# Patient Record
Sex: Female | Born: 1990 | Race: White | Hispanic: No | Marital: Married | Smoking: Never smoker
Health system: Southern US, Community
[De-identification: ages and names within clinical notes are randomized; demographics above are authoritative.]

## PROBLEM LIST (undated history)

## (undated) DIAGNOSIS — A0472 Enterocolitis due to Clostridium difficile, not specified as recurrent: Secondary | ICD-10-CM

## (undated) DIAGNOSIS — Z87898 Personal history of other specified conditions: Secondary | ICD-10-CM

## (undated) DIAGNOSIS — K219 Gastro-esophageal reflux disease without esophagitis: Secondary | ICD-10-CM

## (undated) DIAGNOSIS — D649 Anemia, unspecified: Secondary | ICD-10-CM

## (undated) DIAGNOSIS — Q046 Congenital cerebral cysts: Secondary | ICD-10-CM

## (undated) DIAGNOSIS — F419 Anxiety disorder, unspecified: Secondary | ICD-10-CM

## (undated) HISTORY — DX: Anxiety disorder, unspecified: F41.9

## (undated) HISTORY — DX: Gastro-esophageal reflux disease without esophagitis: K21.9

## (undated) HISTORY — DX: Enterocolitis due to Clostridium difficile, not specified as recurrent: A04.72

## (undated) HISTORY — DX: Congenital cerebral cysts: Q04.6

## (undated) HISTORY — DX: Personal history of other specified conditions: Z87.898

## (undated) HISTORY — PX: EYE SURGERY: SHX253

## (undated) HISTORY — DX: Anemia, unspecified: D64.9

---

## 2016-06-26 DIAGNOSIS — D509 Iron deficiency anemia, unspecified: Secondary | ICD-10-CM | POA: Insufficient documentation

## 2017-01-15 DIAGNOSIS — R09A2 Foreign body sensation, throat: Secondary | ICD-10-CM | POA: Insufficient documentation

## 2017-01-15 DIAGNOSIS — R0989 Other specified symptoms and signs involving the circulatory and respiratory systems: Secondary | ICD-10-CM | POA: Insufficient documentation

## 2017-12-30 HISTORY — PX: UPPER GASTROINTESTINAL ENDOSCOPY: SHX188

## 2018-05-18 DIAGNOSIS — R002 Palpitations: Secondary | ICD-10-CM | POA: Insufficient documentation

## 2018-05-22 DIAGNOSIS — F419 Anxiety disorder, unspecified: Secondary | ICD-10-CM | POA: Insufficient documentation

## 2018-07-27 LAB — HM HEPATITIS C SCREENING LAB: HM Hepatitis Screen: NEGATIVE

## 2018-12-15 DIAGNOSIS — R9089 Other abnormal findings on diagnostic imaging of central nervous system: Secondary | ICD-10-CM | POA: Insufficient documentation

## 2018-12-15 DIAGNOSIS — G43009 Migraine without aura, not intractable, without status migrainosus: Secondary | ICD-10-CM | POA: Insufficient documentation

## 2020-11-01 DIAGNOSIS — H33321 Round hole, right eye: Secondary | ICD-10-CM | POA: Diagnosis not present

## 2020-11-01 DIAGNOSIS — H35413 Lattice degeneration of retina, bilateral: Secondary | ICD-10-CM | POA: Diagnosis not present

## 2020-11-01 DIAGNOSIS — H16423 Pannus (corneal), bilateral: Secondary | ICD-10-CM | POA: Diagnosis not present

## 2021-01-18 DIAGNOSIS — Z833 Family history of diabetes mellitus: Secondary | ICD-10-CM | POA: Diagnosis not present

## 2021-01-18 DIAGNOSIS — Z8379 Family history of other diseases of the digestive system: Secondary | ICD-10-CM | POA: Diagnosis not present

## 2021-01-18 DIAGNOSIS — Z823 Family history of stroke: Secondary | ICD-10-CM | POA: Diagnosis not present

## 2021-01-18 DIAGNOSIS — Z23 Encounter for immunization: Secondary | ICD-10-CM | POA: Diagnosis not present

## 2021-01-18 DIAGNOSIS — Z8249 Family history of ischemic heart disease and other diseases of the circulatory system: Secondary | ICD-10-CM | POA: Diagnosis not present

## 2021-01-18 DIAGNOSIS — Z8489 Family history of other specified conditions: Secondary | ICD-10-CM | POA: Diagnosis not present

## 2021-01-18 DIAGNOSIS — Z8269 Family history of other diseases of the musculoskeletal system and connective tissue: Secondary | ICD-10-CM | POA: Diagnosis not present

## 2021-01-18 DIAGNOSIS — Z8042 Family history of malignant neoplasm of prostate: Secondary | ICD-10-CM | POA: Diagnosis not present

## 2021-01-18 DIAGNOSIS — D649 Anemia, unspecified: Secondary | ICD-10-CM | POA: Diagnosis not present

## 2021-01-18 DIAGNOSIS — Z0000 Encounter for general adult medical examination without abnormal findings: Secondary | ICD-10-CM | POA: Diagnosis not present

## 2021-01-18 DIAGNOSIS — Z1329 Encounter for screening for other suspected endocrine disorder: Secondary | ICD-10-CM | POA: Diagnosis not present

## 2021-01-18 DIAGNOSIS — Z13220 Encounter for screening for lipoid disorders: Secondary | ICD-10-CM | POA: Diagnosis not present

## 2021-01-18 DIAGNOSIS — Z13228 Encounter for screening for other metabolic disorders: Secondary | ICD-10-CM | POA: Diagnosis not present

## 2021-02-03 DIAGNOSIS — Z203 Contact with and (suspected) exposure to rabies: Secondary | ICD-10-CM | POA: Diagnosis not present

## 2021-02-03 DIAGNOSIS — Z2914 Encounter for prophylactic rabies immune globin: Secondary | ICD-10-CM | POA: Diagnosis not present

## 2021-02-03 DIAGNOSIS — Z23 Encounter for immunization: Secondary | ICD-10-CM | POA: Diagnosis not present

## 2021-02-06 DIAGNOSIS — Z203 Contact with and (suspected) exposure to rabies: Secondary | ICD-10-CM | POA: Diagnosis not present

## 2021-02-10 DIAGNOSIS — Z203 Contact with and (suspected) exposure to rabies: Secondary | ICD-10-CM | POA: Diagnosis not present

## 2021-02-17 DIAGNOSIS — Z203 Contact with and (suspected) exposure to rabies: Secondary | ICD-10-CM | POA: Diagnosis not present

## 2021-06-22 DIAGNOSIS — G8929 Other chronic pain: Secondary | ICD-10-CM | POA: Diagnosis not present

## 2021-06-22 DIAGNOSIS — R0989 Other specified symptoms and signs involving the circulatory and respiratory systems: Secondary | ICD-10-CM | POA: Diagnosis not present

## 2021-06-22 DIAGNOSIS — M542 Cervicalgia: Secondary | ICD-10-CM | POA: Diagnosis not present

## 2021-06-22 DIAGNOSIS — M25572 Pain in left ankle and joints of left foot: Secondary | ICD-10-CM | POA: Diagnosis not present

## 2021-06-22 DIAGNOSIS — M25552 Pain in left hip: Secondary | ICD-10-CM | POA: Diagnosis not present

## 2021-06-22 DIAGNOSIS — R110 Nausea: Secondary | ICD-10-CM | POA: Diagnosis not present

## 2021-06-22 DIAGNOSIS — M545 Low back pain, unspecified: Secondary | ICD-10-CM | POA: Diagnosis not present

## 2021-06-22 DIAGNOSIS — M2559 Pain in other specified joint: Secondary | ICD-10-CM | POA: Diagnosis not present

## 2021-07-04 DIAGNOSIS — R0981 Nasal congestion: Secondary | ICD-10-CM | POA: Diagnosis not present

## 2021-07-04 DIAGNOSIS — R0989 Other specified symptoms and signs involving the circulatory and respiratory systems: Secondary | ICD-10-CM | POA: Diagnosis not present

## 2021-07-24 DIAGNOSIS — L309 Dermatitis, unspecified: Secondary | ICD-10-CM | POA: Diagnosis not present

## 2021-08-14 DIAGNOSIS — R519 Headache, unspecified: Secondary | ICD-10-CM | POA: Diagnosis not present

## 2021-08-28 DIAGNOSIS — B9689 Other specified bacterial agents as the cause of diseases classified elsewhere: Secondary | ICD-10-CM | POA: Diagnosis not present

## 2021-08-28 DIAGNOSIS — N760 Acute vaginitis: Secondary | ICD-10-CM | POA: Diagnosis not present

## 2021-08-29 DIAGNOSIS — G44099 Other trigeminal autonomic cephalgias (TAC), not intractable: Secondary | ICD-10-CM | POA: Diagnosis not present

## 2021-08-29 DIAGNOSIS — Q046 Congenital cerebral cysts: Secondary | ICD-10-CM | POA: Insufficient documentation

## 2021-08-29 DIAGNOSIS — G44039 Episodic paroxysmal hemicrania, not intractable: Secondary | ICD-10-CM | POA: Diagnosis not present

## 2021-08-29 DIAGNOSIS — R9089 Other abnormal findings on diagnostic imaging of central nervous system: Secondary | ICD-10-CM | POA: Diagnosis not present

## 2021-08-29 DIAGNOSIS — M4135 Thoracogenic scoliosis, thoracolumbar region: Secondary | ICD-10-CM | POA: Insufficient documentation

## 2021-08-29 DIAGNOSIS — G44209 Tension-type headache, unspecified, not intractable: Secondary | ICD-10-CM | POA: Diagnosis not present

## 2021-09-13 DIAGNOSIS — K148 Other diseases of tongue: Secondary | ICD-10-CM | POA: Diagnosis not present

## 2021-10-08 ENCOUNTER — Other Ambulatory Visit: Payer: Self-pay

## 2021-10-08 ENCOUNTER — Inpatient Hospital Stay (HOSPITAL_COMMUNITY)
Admission: AD | Admit: 2021-10-08 | Discharge: 2021-10-08 | Disposition: A | Discharge status: Home / Self Care | Payer: BC Managed Care – PPO | Attending: Family Medicine | Admitting: Family Medicine

## 2021-10-08 DIAGNOSIS — Z3202 Encounter for pregnancy test, result negative: Secondary | ICD-10-CM

## 2021-10-08 DIAGNOSIS — R1030 Lower abdominal pain, unspecified: Secondary | ICD-10-CM | POA: Insufficient documentation

## 2021-10-08 DIAGNOSIS — N939 Abnormal uterine and vaginal bleeding, unspecified: Secondary | ICD-10-CM | POA: Insufficient documentation

## 2021-10-08 DIAGNOSIS — N930 Postcoital and contact bleeding: Secondary | ICD-10-CM

## 2021-10-08 LAB — POCT PREGNANCY, URINE: Preg Test, Ur: NEGATIVE

## 2021-10-08 NOTE — MAU Provider Note (Signed)
Event Date/Time   First Provider Initiated Contact with Patient 10/08/21 1919      S Donna [name] is a 30 y.o. G0P0 non-pregnant patient who presents to MAU today with chief complaints of worsening postcoital spotting, abnormal uterine bleeding and lower abdominal pain. These are recurrent problems, onset early summer 2022.  Patient's periods are typically manageable and consistent. She endorses new recurrent light spotting between periods. She is not saturating pads but is concerned that she now has some level of bleeding after ever episode of intercourse.  Patient's abdominal pain is mild but worsening over time. Onset shortly after onset of spotting. Pain is suprapubic, does not radiate but is occasionally sharp enough to force her to stop all activity.  Patient states she was evaluated for these complaints at symptom onset when her symptoms were comparatively mild. She states the only abnormality discovered was Bacterial Vaginosis, for which she completed treatment. She has since requested follow-up evaluation in office but has then had to reschedule those appointments due to either  the onset of her menstrual cycle or change in provider schedule. She is tearful throughout ROS and verbalizes frustration that her symptoms are worsening without a diagnosis.   O BP 110/66 (BP Location: Right Arm)   Pulse 75   Temp 97.9 F (36.6 C) (Oral)   Resp 19   Ht 5\' 3"  (1.6 m)   Wt 57.7 kg   SpO2 100%   BMI 22.51 kg/m    Physical Exam Vitals and nursing note reviewed. Exam conducted with a chaperone present.  Constitutional:      Appearance: She is well-developed.  Cardiovascular:     Rate and Rhythm: Normal rate.  Pulmonary:     Effort: Pulmonary effort is normal.  Abdominal:     Palpations: Abdomen is soft.     Tenderness: There is no abdominal tenderness.  Skin:    Capillary Refill: Capillary refill takes less than 2 seconds.  Neurological:     Mental Status: She is alert and  oriented to person, place, and time.  Psychiatric:        Mood and Affect: Mood normal.        Behavior: Behavior normal.     Comments: Tearful throughout provider assessment    A Medical screening exam complete Negative urine pregnancy test in MAU Abnormal Uterine Bleeding Postcoital spotting Abdominal pain   P Discharge from MAU in stable condition Patient declines transfer to [facility] for [facility]YN evaluation  F/U: Outpatient GYN Pelvic ultrasound with TVUS ordered, will follow up on results when available Per patient request, given contact information for [facility] to schedule New GYN appointment  [facility], MSA, MSN, CNM Certified Nurse Midwife, [facility] for [facility]

## 2021-10-08 NOTE — MAU Note (Addendum)
Presents with c/o abdominal pain and VB.  Reports VB is less than menstrual bleeding, but having in between cycles. Denies current abdominal pain.  Pt states she isn't pregnant, has a Gyn office ([place]), went to Urgent Care today.  States will have to wait for appt @ Gyn office.

## 2021-10-22 ENCOUNTER — Other Ambulatory Visit (HOSPITAL_BASED_OUTPATIENT_CLINIC_OR_DEPARTMENT_OTHER): Payer: BC Managed Care – PPO

## 2021-11-01 NOTE — Progress Notes (Signed)
GYNECOLOGY OFFICE VISIT NOTE  History:  Ms. Donna [name] is a 30 y.o. G0P0 non-pregnant patient who presents to MAU today with chief complaints:  Ongoing postcoital spotting This started this summer in August and has been after every time they are sexually active. They are sexually active 1-2 times a month.  Lower abdominal pain and Dyspareunia Patient's abdominal pain is mild but worsening over time. Onset shortly after onset of spotting. Pain is suprapubic, does not radiate but is occasionally sharp enough to force her to stop all activity.  She reports otherwise regular cycles and denies intermenstrual spotting.  She changes a pad when she uses pads every 4-6 hours (4-5 times a day).  She sometimes uses the menstrual cup. She had a TSH in January which was normal. Her Hgb was 10.6 and ferritin noted to be low. She was started on PO iron. She had an EGD.    Past Medical History:  Diagnosis Date   Acid reflux    Anemia    History of palpitations     Past Surgical History:  Procedure Laterality Date   UPPER GASTROINTESTINAL ENDOSCOPY  2019    The following portions of the patient's history were reviewed and updated as appropriate: allergies, current medications, past family history, past medical history, past social history, past surgical history and problem list.   Health Maintenance:    No results found for: DIAGPAP    Review of Systems:  Pertinent items noted in HPI and remainder of comprehensive ROS otherwise negative.  Physical Exam:  BP 120/76   Pulse 86   Wt 127 lb (57.6 kg)   LMP 10/22/2021   BMI 22.50 kg/m  CONSTITUTIONAL: Well-developed, well-nourished female in no acute distress.  HEENT:  Normocephalic, atraumatic. External right and left ear normal. No scleral icterus.  NECK: Normal range of motion, supple, no masses noted on observation SKIN: No rash noted. Not diaphoretic. No erythema. No pallor. MUSCULOSKELETAL: Normal range of motion. No edema  noted. NEUROLOGIC: Alert and oriented to person, place, and time. Normal muscle tone coordination. No cranial nerve deficit noted. PSYCHIATRIC: Normal mood and affect. Normal behavior. Normal judgment and thought content.  CARDIOVASCULAR: Normal heart rate noted RESPIRATORY: Effort and breath sounds normal, no problems with respiration noted ABDOMEN: No masses noted. No other overt distention noted.    PELVIC: Normal appearing external genitalia - negative qtip test; normal urethral meatus; normal appearing vaginal mucosa and cervix.  Levator tenderness bilaterally. No abnormal discharge noted.  Normal uterine size, no other palpable masses, present uterine or adnexal tenderness. Performed in the presence of a chaperone  Labs and Imaging No results found for this or any previous visit (from the past 168 hour(s)). No results found.  Care every where reviewed - BV in August.   Assessment and Plan:    1. Postcoital bleeding - GC/CT checked today; Recent trich negative - Recheck BV although unlikely etiology - No pap on file so this was done today with HPV testing - Given persistence, if testing above is negative, would also do colposcopy and given tenderness, we also discussed doing potentially an EMB. She has an [place] on 11/7 which I instructed her to keep - We also discussed empiric doxy for possible infection of the uterus I.e. chronic endometritis, but she would rather do the EMB/Cx before doing empiric therapy.   2. Intermenstrual spotting - She denies this today although noted in MAU note. Will be evaluated with [place].    3. Abdominal pain, unspecified  abdominal location - Unclear etiology at this time from exam alone - could be entirely from levator spasm/myofascial or possible endometritis. Will do PFPT referral and will make f/u appt to complete evaluation pending pap and Cx.   4. Desires pregnancy - Plans to TTC in December/January  Routine preventative health maintenance measures  emphasized. Please refer to After Visit Summary for other counseling recommendations.   No follow-ups on file.  I spent  48  minutes dedicated to the care of this patient including pre-visit review of records, face to face time with the patient discussing her conditions and treatments and post visit orders.    Milas Hock, MD, FACOG Obstetrician & Gynecologist, [facility] for [facility], [facility]

## 2021-11-02 ENCOUNTER — Encounter: Payer: Self-pay | Admitting: Obstetrics and Gynecology

## 2021-11-02 ENCOUNTER — Other Ambulatory Visit: Payer: Self-pay

## 2021-11-02 ENCOUNTER — Ambulatory Visit: Payer: BC Managed Care – PPO | Admitting: Obstetrics and Gynecology

## 2021-11-02 ENCOUNTER — Other Ambulatory Visit (HOSPITAL_COMMUNITY)
Admission: RE | Admit: 2021-11-02 | Discharge: 2021-11-02 | Disposition: A | Discharge status: Home / Self Care | Payer: BC Managed Care – PPO | Source: Ambulatory Visit | Attending: Obstetrics and Gynecology | Admitting: Obstetrics and Gynecology

## 2021-11-02 VITALS — BP 120/76 | HR 86 | Wt 127.0 lb

## 2021-11-02 DIAGNOSIS — N930 Postcoital and contact bleeding: Secondary | ICD-10-CM | POA: Insufficient documentation

## 2021-11-02 DIAGNOSIS — N923 Ovulation bleeding: Secondary | ICD-10-CM | POA: Diagnosis not present

## 2021-11-02 DIAGNOSIS — R109 Unspecified abdominal pain: Secondary | ICD-10-CM | POA: Insufficient documentation

## 2021-11-02 DIAGNOSIS — Z319 Encounter for procreative management, unspecified: Secondary | ICD-10-CM | POA: Diagnosis not present

## 2021-11-02 NOTE — Progress Notes (Signed)
Patient reports last pap 08/28/2021 with [facility]

## 2021-11-05 ENCOUNTER — Other Ambulatory Visit: Payer: Self-pay

## 2021-11-05 ENCOUNTER — Ambulatory Visit (HOSPITAL_BASED_OUTPATIENT_CLINIC_OR_DEPARTMENT_OTHER)
Admission: RE | Admit: 2021-11-05 | Discharge: 2021-11-05 | Disposition: A | Discharge status: Home / Self Care | Payer: BC Managed Care – PPO | Source: Ambulatory Visit | Attending: Advanced Practice Midwife | Admitting: Advanced Practice Midwife

## 2021-11-05 DIAGNOSIS — R188 Other ascites: Secondary | ICD-10-CM | POA: Diagnosis not present

## 2021-11-05 DIAGNOSIS — N926 Irregular menstruation, unspecified: Secondary | ICD-10-CM | POA: Diagnosis not present

## 2021-11-05 DIAGNOSIS — N930 Postcoital and contact bleeding: Secondary | ICD-10-CM | POA: Diagnosis not present

## 2021-11-05 LAB — CERVICOVAGINAL ANCILLARY ONLY
Bacterial Vaginitis (gardnerella): NEGATIVE
Candida Glabrata: NEGATIVE
Candida Vaginitis: NEGATIVE
Comment: NEGATIVE
Comment: NEGATIVE
Comment: NEGATIVE

## 2021-11-07 LAB — CYTOLOGY - PAP
Chlamydia: NEGATIVE
Comment: NEGATIVE
Comment: NEGATIVE
Comment: NORMAL
Diagnosis: NEGATIVE
Diagnosis: REACTIVE
High risk HPV: NEGATIVE
Neisseria Gonorrhea: NEGATIVE

## 2021-12-05 ENCOUNTER — Encounter: Payer: BC Managed Care – PPO | Admitting: Obstetrics and Gynecology

## 2021-12-06 ENCOUNTER — Other Ambulatory Visit: Payer: Self-pay

## 2021-12-06 ENCOUNTER — Encounter: Payer: Self-pay | Admitting: Physical Therapy

## 2021-12-06 ENCOUNTER — Ambulatory Visit: Payer: BC Managed Care – PPO | Attending: Obstetrics and Gynecology | Admitting: Physical Therapy

## 2021-12-06 DIAGNOSIS — R279 Unspecified lack of coordination: Secondary | ICD-10-CM | POA: Insufficient documentation

## 2021-12-06 DIAGNOSIS — R269 Unspecified abnormalities of gait and mobility: Secondary | ICD-10-CM | POA: Insufficient documentation

## 2021-12-06 DIAGNOSIS — R109 Unspecified abdominal pain: Secondary | ICD-10-CM | POA: Diagnosis not present

## 2021-12-06 DIAGNOSIS — M6281 Muscle weakness (generalized): Secondary | ICD-10-CM | POA: Diagnosis not present

## 2021-12-06 DIAGNOSIS — R293 Abnormal posture: Secondary | ICD-10-CM | POA: Diagnosis not present

## 2021-12-06 DIAGNOSIS — R252 Cramp and spasm: Secondary | ICD-10-CM | POA: Insufficient documentation

## 2021-12-06 NOTE — Patient Instructions (Signed)
Bladder Irritants  Certain foods and beverages can be irritating to the bladder.  Avoiding these irritants may decrease your symptoms of urinary urgency, frequency or bladder pain.  Even reducing your intake can help with your symptoms.  Not everyone is sensitive to all bladder irritants, so you may consider focusing on one irritant at a time, removing or reducing your intake of that irritant for 7-10 days to see if this change helps your symptoms.  Water intake is also very important.  Below is a list of bladder irritants.  Drinks: alcohol, carbonated beverages, caffeinated beverages such as coffee and tea, drinks with artificial sweeteners, citrus juices, apple juice, tomato juice  Foods: tomatoes and tomato based foods, spicy food, sugar and artificial sweeteners, vinegar, chocolate, raw onion, apples, citrus fruits, pineapple, cranberries, tomatoes, strawberries, plums, peaches, cantaloupe  Other: acidic urine (too concentrated) - see water intake info below  Substitutes you can try that are NOT irritating to the bladder: cooked onion, pears, papayas, sun-brewed decaf teas, watermelons, non-citrus herbal teas, apricots, kava and low-acid instant drinks (Postum).    WATER INTAKE: Remember to drink lots of water (aim for fluid intake of half your body weight with 2/3 of fluids being water).  You may be limiting fluids due to fear of leakage, but this can actually worsen urgency symptoms due to highly concentrated urine.  Water helps balance the pH of your urine so it doesn't become too acidic - acidic urine is a bladder irritant!    Lubrication Used for intercourse to reduce friction Avoid ones that have glycerin, warming gels, tingling gels, icing or cooling gel, scented Avoid parabens due to a preservative similar to female sex hormone May need to be reapplied once or several times during sexual activity Can be applied to both partners genitals prior to vaginal penetration to minimize  friction or irritation Prevent irritation and mucosal tears that cause post coital pain and increased the risk of vaginal and urinary tract infections Oil-based lubricants cannot be used with condoms due to breaking them down.  Least likely to irritate vaginal tissue.  Plant based-lubes are safe Silicone-based lubrication are thicker and last long and used for post-menopausal women  Vaginal Lubricators Here is a list of some suggested lubricators you can use for intercourse. Use the most hypoallergenic product.  You can place on you or your partner.  Slippery Stuff ( water based) Sylk or Sliquid Natural H2O ( good  if frequent UTI's)- walmart, amazon Sliquid organics silk-(aloe and silicone based ) [employer] (www.blossom-organics.com)- (aloe based ) Coconut oil, olive oil -not good with condoms  PJur Woman Nude- (water based) amazon Uberlube- ( silicon) Amazon Aloe Vera- Sprouts has an organic one Yes lubricant- (water based and has plant oil based similar to silicone) [employer] Platinum-Silicone, Target, [facility] Olive and Bee intimate cream-  www.oliveandbee.com.au Pink - [employer] stuff Erosense Sync- walmart, amazon Coconu- EverydayCosmetics.no  Things to avoid in lubricants are glycerin, warming gels, tingling gels, icing or cooling  gels, and scented gels.  Also avoid Vaseline. KY jelly, Replens, and Astroglide contain chlorhexidine which kills good bacteria(lactobacilli)  Things to avoid in the vaginal area Do not use things to irritate the vulvar area No lotions- see below Soaps you  can use :Aveeno, Calendula, Good Clean Love cleanser if needed. Must be gentle No deodorants No douches Good to sleep without underwear to let the vaginal area to air out No scrubbing: spread the lips to let warm water rinse over labias and pat  dry  Creams that can be used on the Vulva Area V [employer], walmart Vital V Wild Yam Salve Julva- [employer] Botanical Pro-Meno Wild Yam  Cream Coconut oil, olive oil Cleo by [employer] labial moisturizer -Amazon,  Desert Warren Releveum ( lidocaine) or Desert [employer] Yes [employer]

## 2021-12-06 NOTE — Therapy (Signed)
[facility] @ [facility] [address] [place], [place], [address] Phone: [phone]   Fax:  [phone]  Physical Therapy Evaluation  Patient Details  Name: Donna [name] MRN: [id number] Date of Birth: November 05, 1991 Referring Provider (PT): Milas Hock, MD   Encounter Date: 12/06/2021   PT End of Session - 12/06/21 1529     Visit Number 1    Date for PT Re-Evaluation 04/06/22    PT Start Time 1436    PT Stop Time 1524    PT Time Calculation (min) 48 min    Activity Tolerance Patient tolerated treatment well;Patient limited by pain    Behavior During Therapy [facility] for tasks assessed/performed             Past Medical History:  Diagnosis Date   Acid reflux    Anemia    History of palpitations     Past Surgical History:  Procedure Laterality Date   UPPER GASTROINTESTINAL ENDOSCOPY  2019    There were no vitals filed for this visit.    Subjective Assessment - 12/06/21 1438     Subjective Pt reports she has been having pressure/pain increasing recent past few months, pain with intercourse deep penetration, does have dryness and irritation. Pt has pain globally chronically and constantly mostly on the Lt side and midly on Rt side. Pt reports pain with recent gyno internal assessment. Pt reports spotting after intercourse and did have break through bleeding last week, does have regular cycles.    Patient Stated Goals Pelvic sonogram is otherwise unremarkable.    Currently in Pain? Yes    Pain Score 1    6/10 global pain currently   Pain Location Pelvis    Pain Orientation Left    Pain Descriptors / Indicators Throbbing    Pain Type Chronic pain    Pain Frequency Constant                [facility] PT Assessment - 12/06/21 0001       Assessment   Medical Diagnosis R10.9 (ICD-10-CM) - Abdominal pain, unspecified abdominal location    Referring Provider (PT) Milas Hock, MD    Prior Therapy no      Precautions    Precautions None      Restrictions   Weight Bearing Restrictions No      Balance Screen   Has the patient fallen in the past 6 months No    Has the patient had a decrease in activity level because of a fear of falling?  No    Is the patient reluctant to leave their home because of a fear of falling?  No      Home [employer]ist information centre manager residence    Living Arrangements Spouse/significant other      Prior Function   Level of Independence Independent    Vocation Full time employment    Therapist, occupational service    Leisure read, nap, TV      Cognition   Overall Cognitive Status Within Functional Limits for tasks assessed      Sensation   Light Touch Impaired Detail    Light Touch Impaired Details --   Lt shoulder pain that feels cold into mid upper arm for short periods of time. Pt reports sometimes she will notices numbness in fingers and toes but not regularly.     Coordination   Gross Motor Movements are Fluid and Coordinated Yes    Fine Motor Movements  are Fluid and Coordinated Yes      Posture/Postural Control   Posture/Postural Control Postural limitations    Postural Limitations Rounded Shoulders;Posterior pelvic tilt      ROM / Strength   AROM / PROM / Strength AROM;Strength      AROM   Overall AROM Comments thoracic and lumbar spine limited by 50% in side bending, flexion and 25% in rotation and extension      Strength   Overall Strength Comments Rt hip 4/5 grossly; Lt hip 3-/5 abduction, 3/5 all others      Flexibility   Soft Tissue Assessment /Muscle Length yes   decreased by 25% in bil adduction and hamstrings and IR/ER     Palpation   Palpation comment TTP throughout abdomen all quadrants and fascial restrictions noted throughout, poor mobility of abdomen. Also decreased rib mobility with expansion posteriorly and laterally.              No emotional/communication barriers or cognitive limitation. Patient is motivated to  learn. Patient understands and agrees with treatment goals and plan. PT explains patient will be examined in standing, sitting, and lying down to see how their muscles and joints work. When they are ready, they will be asked to remove their underwear so PT can examine their perineum. The patient is also given the option of providing their own chaperone as one is not provided in our facility. The patient also has the right and is explained the right to defer or refuse any part of the evaluation or treatment including the internal exam. With the patient's consent, PT will use one gloved finger to gently assess the muscles of the pelvic floor, seeing how well it contracts and relaxes and if there is muscle symmetry. After, the patient will get dressed and PT and patient will discuss exam findings and plan of care. PT and patient discuss plan of care, schedule, attendance policy and HEP activities.           Objective measurements completed on examination: See above findings.     Pelvic Floor Special Questions - 12/06/21 0001     Prior Pelvic/Prostate Exam Yes   normal   Are you Pregnant or attempting pregnancy? No   not pregnant > potentially trying to get pregnant   Prior Pregnancies No    Currently Sexually Active Yes    Is this Painful Yes    History of sexually transmitted disease No    Marinoff Scale pain prevents any attempts at intercourse    Urinary Leakage No    Urinary urgency Yes   and will go to restroom, and not a lot of urine will come out with mild burning initially.   Urinary frequency sometimes feels like she needs to go quicker than every 2-3 hours    Fecal incontinence No   does report constipation struggles constantly   Fluid intake not a lot of water    Caffeine beverages yes- cutting back and now drinking more decaf    Falling out feeling (prolapse) No    External Perineal Exam WFL    Prolapse None    Pelvic Floor Internal Exam patient identified and patient confirms  consent for PT to perform internal soft tissue work and muscle strength and integrity assessment    Exam Type Vaginal    Sensation hypersensitive    Palpation TTP externally at UG diaphragm, Lt adductor proximially, and suprapubic pain    Strength --   0/5 however high tone throughout pelvis  so possibly due to tone pt unable to relax enough to demonstrate contraction.   Strength # of reps 0    Strength # of seconds 0    Tone increased                         PT Short Term Goals - 12/06/21 1713       PT SHORT TERM GOAL #1   Title pt to be I with HEP    Time 5    Period Weeks    Status New    Target Date 01/10/22      PT SHORT TERM GOAL #2   Title pt to report no more than 5/10 with mobility at pelvis to improve tolerance to exercising and community mobility    Time 5    Period Weeks    Status New    Target Date 01/10/22      PT SHORT TERM GOAL #3   Title pt to demonstrate ability to coordinate pelvic floor and breathing mechanics at least 50% of the time with activity to improve pelvic floor function.    Time 5    Period Weeks    Status New    Target Date 01/10/22      PT SHORT TERM GOAL #4   Title pt to report improved bowel regularity without need of straining to at least 3 BMs per week.    Time 5    Period Weeks    Status New    Target Date 01/10/22      PT SHORT TERM GOAL #5   Title pt to demonstrate at least 3/5 pelvic floor strength and ability to isometrically hold contractions 5s for improved pelvic floor function    Time 5    Period Weeks    Status New    Target Date 01/10/22               PT Long Term Goals - 12/06/21 1715       PT LONG TERM GOAL #1   Title pt to be I withadvanced  HEP    Time 4    Status New    Target Date 04/06/22      PT LONG TERM GOAL #2   Title pt to report no more than 2/10 with mobility at pelvis to improve tolerance to exercising and community mobility    Time 4    Period Months    Status New     Target Date 04/06/22      PT LONG TERM GOAL #3   Title pt to demonstrate ability to coordinate pelvic floor and breathing mechanics at least 75% of the time with activity to improve pelvic floor function.    Time 4    Period Months    Status New    Target Date 04/06/22      PT LONG TERM GOAL #4   Title pt to demonstrate at least 4/5 pelvic floor strength and ability to isometrically hold contractions 8s for improved pelvic floor function    Time 4    Status New    Target Date 04/06/22      PT LONG TERM GOAL #5   Title pt to demonstrate ability tos squat 20# from ground level with proper mechanics with no more than 2/10 pain for improved functional mobility    Time 4    Period Months    Status New    Target Date 04/06/22  Plan - 12/06/21 1529     Clinical Impression Statement Pt is 30 year old female presenting to clinic reporting global pain more so at Lt side compared to Rt but on both sides, had a recent gynocologist appointment and told she had a weak pelvic floor and had pain everywhere during this per pt. Pt also reports she has had struggled with constipation chronically, pain with deep penetration with intercourse, does have dryness and irritation vaginally, and has had spotting post intercourse as well. Pt reports recently she has been cutting back on caffiene and trying to drink more water but knows she doesn't drink enough, and very motivated to improve self physically and get healthier to try to have less pain. Pt also reports she feels she has a lot stress with work, home renovations, and pain which makes her feel worse. Pt found to have decreased mobility in spine and bil hips, decreased strength in hips, and fascial restrictions noted in abdomen, pelvis and hips and glutes. Pt also had TTP at abdomen, pelvic region, adductors, quads. Pt consented to internal pelvic floor assessment and found to have high tone throughout, decreased coordination and  inability to contract or relax pelvic floor at all. Pt also had decreased rib mobility and breathing mechanics. Pt would benefit from addtional PT to further address deficits found during eval.    Personal Factors and Comorbidities Time since onset of injury/illness/exacerbation    Examination-Activity Limitations Lift;Squat;Locomotion Level    Examination-Participation Restrictions Interpersonal Relationship;Community Activity;Shop;Occupation    Stability/Clinical Decision Making Evolving/Moderate complexity    Clinical Decision Making Moderate    Rehab Potential Good    PT Frequency 1x / week    PT Duration Other (comment)   10 weeks   PT Treatment/Interventions ADLs/Self Care Home Management;Aquatic Therapy;Functional mobility training;Therapeutic activities;Therapeutic exercise;Neuromuscular re-education;Manual techniques;Patient/family education;Taping;Scar mobilization;Passive range of motion;Energy conservation    PT Next Visit Plan stretchin at hips, breathing mechanics, manual at hips and abdomen    PT Home Exercise Plan [email or web address]    Consulted and Agree with Plan of Care Patient             Patient will benefit from skilled therapeutic intervention in order to improve the following deficits and impairments:  Decreased coordination, Decreased endurance, Pain, Impaired flexibility, Improper body mechanics, Postural dysfunction, Decreased strength, Decreased mobility, Impaired tone, Increased fascial restricitons  Visit Diagnosis: Muscle weakness (generalized) - Plan: PT plan of care cert/re-cert  Lack of coordination - Plan: PT plan of care cert/re-cert  Cramp and spasm - Plan: PT plan of care cert/re-cert  Abnormality of gait and mobility - Plan: PT plan of care cert/re-cert  Abnormal posture - Plan: PT plan of care cert/re-cert     Problem List Patient Active Problem List   Diagnosis Date Noted   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 [place] [facility] [place], [place], [address] Phone: [phone]   Fax:  [phone]  Name: Donna [name] MRN: [id number] Date of Birth: [birth year]

## 2022-01-09 ENCOUNTER — Other Ambulatory Visit: Payer: Self-pay

## 2022-01-09 ENCOUNTER — Ambulatory Visit: Payer: BC Managed Care – PPO | Attending: Obstetrics and Gynecology | Admitting: Physical Therapy

## 2022-01-09 DIAGNOSIS — R279 Unspecified lack of coordination: Secondary | ICD-10-CM | POA: Insufficient documentation

## 2022-01-09 DIAGNOSIS — R252 Cramp and spasm: Secondary | ICD-10-CM | POA: Diagnosis not present

## 2022-01-09 NOTE — Therapy (Signed)
[facility] [address] [place], [place], [address] Phone: [phone]   Fax:  [phone]  Physical Therapy Treatment  Patient Details  Name: Donna [name] MRN: [id number] Date of Birth: Sep 13, 1991 Referring Provider (PT): Milas Hockuncan, Paula, MD   Encounter Date: 01/09/2022   PT End of Session - 01/09/22 1254     Visit Number 2    Date for PT Re-Evaluation 04/06/22    Authorization Type BCBS    PT Start Time 1145    PT Stop Time 1226    PT Time Calculation (min) 41 min    Activity Tolerance Patient tolerated treatment well;Patient limited by pain    Behavior During Therapy [facility] for tasks assessed/performed             Past Medical History:  Diagnosis Date   Acid reflux    Anemia    History of palpitations     Past Surgical History:  Procedure Laterality Date   UPPER GASTROINTESTINAL ENDOSCOPY  2019    There were no vitals filed for this visit.   Subjective Assessment - 01/09/22 1147     Subjective Pt reports she has had intermittent pain ups and downs since eval. Pt has had a little pelvic pain with initially starting HEP but has not had any bleeding or pain with intercourse.                            Pelvic Floor Special Questions - 01/09/22 0001     Pelvic Floor Internal Exam patient identified and patient confirms consent for PT to perform internal soft tissue work and muscle strength and integrity assessment    Exam Type Vaginal    Tone see manual for details               [facility] Adult PT Treatment/Exercise - 01/09/22 0001       Self-Care   Self-Care Other Self-Care Comments    Other Self-Care Comments  pt educated on HEP, relaxation techniques      Exercises   Exercises Lumbar;Knee/Hip      Lumbar Exercises: Seated   Other Seated Lumbar Exercises pelvic tilts on pool noodle x10 ant/post, Rt/Lt and static sitting with diaphragmatic breathing x10    Other Seated Lumbar  Exercises pelvic tilts sitting on ball x10 Rt/Lt, ant/post, figure 8s      Manual Therapy   Manual Therapy Soft tissue mobilization;Myofascial release    Manual therapy comments manual work at [employer] triangle for decreased tone with great tightness noted externally over leggings with progressively increasing pressure of release techniques with pt reporting improvement in mobility and decreased pain. PT also completed manual work at bil ischial tubs with increased tension in surrounding medial tissue as well.                     PT Education - 01/09/22 1254     Education Details Pt educated on continued pelvic relaxation techniques, HEP    Person(s) Educated Patient    Methods Explanation;Demonstration;Tactile cues;Verbal cues    Comprehension Returned demonstration;Verbalized understanding              PT Short Term Goals - 12/06/21 1713       PT SHORT TERM GOAL #1   Title pt to be I with HEP    Time 5    Period Weeks    Status New    Target  Date 01/10/22      PT SHORT TERM GOAL #2   Title pt to report no more than 5/10 with mobility at pelvis to improve tolerance to exercising and community mobility    Time 5    Period Weeks    Status New    Target Date 01/10/22      PT SHORT TERM GOAL #3   Title pt to demonstrate ability to coordinate pelvic floor and breathing mechanics at least 50% of the time with activity to improve pelvic floor function.    Time 5    Period Weeks    Status New    Target Date 01/10/22      PT SHORT TERM GOAL #4   Title pt to report improved bowel regularity without need of straining to at least 3 BMs per week.    Time 5    Period Weeks    Status New    Target Date 01/10/22      PT SHORT TERM GOAL #5   Title pt to demonstrate at least 3/5 pelvic floor strength and ability to isometrically hold contractions 5s for improved pelvic floor function    Time 5    Period Weeks    Status New    Target Date 01/10/22               PT  Long Term Goals - 12/06/21 1715       PT LONG TERM GOAL #1   Title pt to be I withadvanced  HEP    Time 4    Status New    Target Date 04/06/22      PT LONG TERM GOAL #2   Title pt to report no more than 2/10 with mobility at pelvis to improve tolerance to exercising and community mobility    Time 4    Period Months    Status New    Target Date 04/06/22      PT LONG TERM GOAL #3   Title pt to demonstrate ability to coordinate pelvic floor and breathing mechanics at least 75% of the time with activity to improve pelvic floor function.    Time 4    Period Months    Status New    Target Date 04/06/22      PT LONG TERM GOAL #4   Title pt to demonstrate at least 4/5 pelvic floor strength and ability to isometrically hold contractions 8s for improved pelvic floor function    Time 4    Status New    Target Date 04/06/22      PT LONG TERM GOAL #5   Title pt to demonstrate ability tos squat 20# from ground level with proper mechanics with no more than 2/10 pain for improved functional mobility    Time 4    Period Months    Status New    Target Date 04/06/22                   Plan - 01/09/22 1257     Clinical Impression Statement Pt presents to clinic reporting intermittent pelvic pain but no pain with vaginal penetration or break through bleeding or postcoital spotting since eval. Pt session focused on manual work at [employer] triangle for improved pelvic relaxation and education on HEP and pelvic relaxation techniques for home. Pt reported improvement at end of session with not feeling as tight but did have a little discomfort at Rt ishcial tube but "not bad". Pt denied additional questions  at end of session, continues to demonstrate need of PT for decreased pain, improved tissue mobility and improved QOL.    Personal Factors and Comorbidities Time since onset of injury/illness/exacerbation    Examination-Activity Limitations Lift;Squat;Locomotion Level     Examination-Participation Restrictions Interpersonal Relationship;Community Activity;Shop;Occupation    Stability/Clinical Decision Making Evolving/Moderate complexity    Rehab Potential Good    PT Frequency 1x / week    PT Duration Other (comment)   10 weeks   PT Treatment/Interventions ADLs/Self Care Home Management;Aquatic Therapy;Functional mobility training;Therapeutic activities;Therapeutic exercise;Neuromuscular re-education;Manual techniques;Patient/family education;Taping;Scar mobilization;Passive range of motion;Energy conservation    PT Next Visit Plan stretchin at hips, breathing mechanics, manual at hips and abdomen    PT Home Exercise Plan [email or web address]    Consulted and Agree with Plan of Care Patient             Patient will benefit from skilled therapeutic intervention in order to improve the following deficits and impairments:  Decreased coordination, Decreased endurance, Pain, Impaired flexibility, Improper body mechanics, Postural dysfunction, Decreased strength, Decreased mobility, Impaired tone, Increased fascial restricitons  Visit Diagnosis: Cramp and spasm  Lack of coordination     Problem List Patient Active Problem List   Diagnosis Date Noted   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 [place], [place], [address] Phone: [phone]   Fax:  [phone]  Name: Rumor Sun MRN: [id number] Date of Birth: 19-Dec-1991

## 2022-01-09 NOTE — Patient Instructions (Signed)
[email or web address].youtube.com/[email or web address]   

## 2022-01-14 ENCOUNTER — Encounter: Payer: BC Managed Care – PPO | Admitting: Obstetrics and Gynecology

## 2022-01-28 ENCOUNTER — Encounter: Payer: Self-pay | Admitting: Obstetrics and Gynecology

## 2022-01-28 ENCOUNTER — Encounter: Payer: Self-pay | Admitting: General Practice

## 2022-01-28 ENCOUNTER — Other Ambulatory Visit (HOSPITAL_COMMUNITY)
Admission: RE | Admit: 2022-01-28 | Discharge: 2022-01-28 | Disposition: A | Discharge status: Home / Self Care | Payer: BC Managed Care – PPO | Source: Ambulatory Visit | Attending: Obstetrics and Gynecology | Admitting: Obstetrics and Gynecology

## 2022-01-28 ENCOUNTER — Other Ambulatory Visit: Payer: Self-pay

## 2022-01-28 ENCOUNTER — Ambulatory Visit (INDEPENDENT_AMBULATORY_CARE_PROVIDER_SITE_OTHER): Payer: BC Managed Care – PPO | Admitting: Obstetrics and Gynecology

## 2022-01-28 VITALS — BP 127/80 | HR 81 | Wt 130.0 lb

## 2022-01-28 DIAGNOSIS — N930 Postcoital and contact bleeding: Secondary | ICD-10-CM

## 2022-01-28 DIAGNOSIS — N72 Inflammatory disease of cervix uteri: Secondary | ICD-10-CM | POA: Diagnosis not present

## 2022-01-28 LAB — POCT URINE PREGNANCY: Preg Test, Ur: NEGATIVE

## 2022-01-28 NOTE — Progress Notes (Signed)
° ° °  GYNECOLOGY OFFICE COLPOSCOPY PROCEDURE NOTE  31 y.o. 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   Pregnancy test:  negative  Informed consent and review of risks, benefit and alternatives performed. Written consent given.   Speculum inserted into patient's vagina assuring full view of cervix and vaginal walls. 3 swabs of vinegar solution applied to the cervix and vaginal walls and colposcope was used to observe both the cervix and vaginal walls.   Colposcopy adequate? Yes  no visible lesions, no mosaicism, no punctation, and no abnormal vasculature; corresponding biopsies obtained.  ECC collected.  All specimens were labeled and sent to pathology.  Monsel's applied to biopsy sites for good hemostasis and speculum removed.  Pt tolerated well with minimal pain and bleeding.   Patient was given post procedure instructions.  Will follow up pathology and manage accordingly; patient will be contacted with results and recommendations.  Routine preventative health maintenance measures emphasized.    Milas Hock, MD, FACOG Obstetrician & Gynecologist, [facility], [facility]

## 2022-01-28 NOTE — Addendum Note (Signed)
Addended by: Valentina Lucks on: 01/28/2022 01:36 PM   Modules accepted: Orders

## 2022-01-28 NOTE — Addendum Note (Signed)
Addended by: Mikey Bussing on: 01/28/2022 01:33 PM   Modules accepted: Orders

## 2022-01-30 ENCOUNTER — Other Ambulatory Visit: Payer: Self-pay

## 2022-01-30 ENCOUNTER — Ambulatory Visit: Payer: BC Managed Care – PPO | Attending: Obstetrics and Gynecology | Admitting: Physical Therapy

## 2022-01-30 DIAGNOSIS — R252 Cramp and spasm: Secondary | ICD-10-CM | POA: Diagnosis not present

## 2022-01-30 DIAGNOSIS — M6281 Muscle weakness (generalized): Secondary | ICD-10-CM

## 2022-01-30 DIAGNOSIS — R278 Other lack of coordination: Secondary | ICD-10-CM | POA: Diagnosis not present

## 2022-01-30 DIAGNOSIS — R279 Unspecified lack of coordination: Secondary | ICD-10-CM | POA: Diagnosis not present

## 2022-01-30 DIAGNOSIS — R293 Abnormal posture: Secondary | ICD-10-CM | POA: Diagnosis not present

## 2022-01-30 LAB — SURGICAL PATHOLOGY

## 2022-01-30 NOTE — Therapy (Signed)
[facility] @ [address] [place], [place], [address] Phone: [phone]   Fax:  [phone]  Physical Therapy Treatment  Patient Details  Name: Donna [name] MRN: [id number] Date of Birth: Dec 05, 1991 Referring Provider (PT): Radene Gunning, MD   Encounter Date: 01/30/2022   PT End of Session - 01/30/22 1223     Visit Number 3    Date for PT Re-Evaluation 04/06/22    Authorization Type BCBS    PT Start Time 1147    PT Stop Time 1227    PT Time Calculation (min) 40 min    Activity Tolerance Patient tolerated treatment well;Patient limited by pain    Behavior During Therapy [facility] for tasks assessed/performed             Past Medical History:  Diagnosis Date   Acid reflux    Anemia    History of palpitations     Past Surgical History:  Procedure Laterality Date   UPPER GASTROINTESTINAL ENDOSCOPY  2019    There were no vitals filed for this visit.   Subjective Assessment - 01/30/22 1149     Subjective Pt reports she is have less intense pelvic pain but frequency is the same but not lasting as long. Pt did have one instance of breakthrough bleeding not with period for one day then stopped. Pt denies pain with intercourse.    Currently in Pain? No/denies                               [facility] Adult PT Treatment/Exercise - 01/30/22 0001       Exercises   Exercises Knee/Hip;Lumbar      Lumbar Exercises: Seated   Other Seated Lumbar Exercises happy baby on ball x30s; lateral trunk stretch x30s each    Other Seated Lumbar Exercises pelvic tilts sitting on ball x10 Rt/Lt, ant/post, figure 8s      Lumbar Exercises: Quadruped   Madcat/Old Horse 10 reps      Manual Therapy   Manual Therapy Scapular mobilization;Myofascial release    Manual therapy comments manual work at abdomen in supine: fascial restrictions noted throughout al quadrants in all directions with pt reporting pain with Lt side more  so than right but tenderness improvement post treatment.                     PT Education - 01/30/22 1222     Education Details pt educaonted on pelvic relaxation techniques and continued HEP    Person(s) Educated Patient    Methods Explanation;Demonstration;Tactile cues;Verbal cues    Comprehension Returned demonstration;Verbalized understanding              PT Short Term Goals - 12/06/21 1713       PT SHORT TERM GOAL #1   Title pt to be I with HEP    Time 5    Period Weeks    Status New    Target Date 01/10/22      PT SHORT TERM GOAL #2   Title pt to report no more than 5/10 with mobility at pelvis to improve tolerance to exercising and community mobility    Time 5    Period Weeks    Status New    Target Date 01/10/22      PT SHORT TERM GOAL #3   Title pt to demonstrate ability to coordinate pelvic floor and breathing mechanics at  least 50% of the time with activity to improve pelvic floor function.    Time 5    Period Weeks    Status New    Target Date 01/10/22      PT SHORT TERM GOAL #4   Title pt to report improved bowel regularity without need of straining to at least 3 BMs per week.    Time 5    Period Weeks    Status New    Target Date 01/10/22      PT SHORT TERM GOAL #5   Title pt to demonstrate at least 3/5 pelvic floor strength and ability to isometrically hold contractions 5s for improved pelvic floor function    Time 5    Period Weeks    Status New    Target Date 01/10/22               PT Long Term Goals - 12/06/21 1715       PT LONG TERM GOAL #1   Title pt to be I withadvanced  HEP    Time 4    Status New    Target Date 04/06/22      PT LONG TERM GOAL #2   Title pt to report no more than 2/10 with mobility at pelvis to improve tolerance to exercising and community mobility    Time 4    Period Months    Status New    Target Date 04/06/22      PT LONG TERM GOAL #3   Title pt to demonstrate ability to coordinate  pelvic floor and breathing mechanics at least 75% of the time with activity to improve pelvic floor function.    Time 4    Period Months    Status New    Target Date 04/06/22      PT LONG TERM GOAL #4   Title pt to demonstrate at least 4/5 pelvic floor strength and ability to isometrically hold contractions 8s for improved pelvic floor function    Time 4    Status New    Target Date 04/06/22      PT LONG TERM GOAL #5   Title pt to demonstrate ability tos squat 20# from ground level with proper mechanics with no more than 2/10 pain for improved functional mobility    Time 4    Period Months    Status New    Target Date 04/06/22                   Plan - 01/30/22 1223     Clinical Impression Statement Pt presents to clinic reporting improvement with symptoms overall, less pain with intercourse, less pain with urination, less pain in pelvis overall. Pt also has improved frequency of urination to around 3 hours now. Pt reports she has been doing HEP and reports MD did PAP and reports improvement with symptoms compared to prior to PT. Pt declined internal this date but session focused on abdominal fasical release throughout then stretching for hip and core and lumbar spine mobility and decreased tightness at pelvis. Pt tolerated well with minimal rest breaks and reported feeling better post session. Pt would benefit from additional PT to further address deficits.    Personal Factors and Comorbidities Time since onset of injury/illness/exacerbation    Examination-Activity Limitations Lift;Squat;Locomotion Level    Examination-Participation Restrictions Interpersonal Relationship;Community Activity;Shop;Occupation    Stability/Clinical Decision Making Evolving/Moderate complexity    Rehab Potential Good    PT Frequency 1x /  week    PT Duration Other (comment)   10 weeks   PT Treatment/Interventions ADLs/Self Care Home Management;Aquatic Therapy;Functional mobility training;Therapeutic  activities;Therapeutic exercise;Neuromuscular re-education;Manual techniques;Patient/family education;Taping;Scar mobilization;Passive range of motion;Energy conservation    PT Next Visit Plan stretchin at hips, breathing mechanics, manual at hips and abdomen    PT Home Exercise Plan [id number]    Consulted and Agree with Plan of Care Patient             Patient will benefit from skilled therapeutic intervention in order to improve the following deficits and impairments:  Decreased coordination, Decreased endurance, Pain, Impaired flexibility, Improper body mechanics, Postural dysfunction, Decreased strength, Decreased mobility, Impaired tone, Increased fascial restricitons  Visit Diagnosis: Lack of coordination  Muscle weakness (generalized)     Problem List Patient Active Problem List   Diagnosis Date Noted   Schizencephaly ([place]) 08/29/2021   Thoracogenic scoliosis of thoracolumbar region 08/29/2021   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

## 2022-02-01 ENCOUNTER — Ambulatory Visit: Payer: BC Managed Care – PPO | Admitting: Family

## 2022-02-01 VITALS — BP 108/70 | HR 69 | Temp 98.1°F | Ht 63.0 in | Wt 132.2 lb

## 2022-02-01 DIAGNOSIS — Z0184 Encounter for antibody response examination: Secondary | ICD-10-CM | POA: Diagnosis not present

## 2022-02-01 DIAGNOSIS — M4135 Thoracogenic scoliosis, thoracolumbar region: Secondary | ICD-10-CM | POA: Diagnosis not present

## 2022-02-01 DIAGNOSIS — Z20820 Contact with and (suspected) exposure to varicella: Secondary | ICD-10-CM | POA: Diagnosis not present

## 2022-02-01 NOTE — Progress Notes (Signed)
Donna [name] is a 31 y.o. 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     Prenatal Vit-Fe Fumarate-FA (MULTIVITAMIN-PRENATAL) 27-0.8 MG TABS tablet Take 1 tablet by mouth daily at 12 noon.     No current facility-administered medications for this visit.    Allergies: Penicillin g and Sulfamethoxazole  Past Medical History:  Diagnosis Date   Acid reflux    Anemia    History of palpitations     Past Surgical History:  Procedure Laterality Date   UPPER GASTROINTESTINAL ENDOSCOPY  2019    Family History  Problem Relation Age of Onset   Cancer Maternal Grandfather        Prostate   Diabetes Brother        type 1   Hypertension Neg Hx     Social History   Tobacco Use   Smoking status: Never   Smokeless tobacco: Never  Substance Use Topics   Alcohol use: Yes    Comment: occasionally    Subjective:   Left sided neck pain/ ankle pain/ hip pain x 10 years; x-rays have been done and they are always normal; no known family history of arthritis issues; pain only localized on left side;  Notes that left ankle is most problematic; has seen orthopedist in the past about her left knee only; Denies any concerns that joints are swollen;  Did have x-rays and labs in June 2022 for similar complaint; X-ray did show scoliosis; Is in PT for pelvic floor dysfunction;    Objective:  Vitals:   02/01/22 1407  BP: 108/70  Pulse: 69  Temp: 98.1  F (36.7 C)  TempSrc: Oral  SpO2: 98%  Weight: 132 lb 3.2 oz (60 kg)  Height: 5\' 3"  (1.6 m)    General: Well developed, well nourished, in no acute distress  Skin : Warm and dry.  Head: Normocephalic and atraumatic  Lungs: Respirations unlabored;  Musculoskeletal: No deformities; no active joint inflammation  Extremities: No edema, cyanosis, clubbing  Vessels: Symmetric bilaterally  Neurologic: Alert and oriented; speech intact; face symmetrical; moves all extremities well; CNII-XII intact without focal deficit   Assessment:  1. Thoracogenic scoliosis of thoracolumbar region   2. Immunity status testing     Plan:  ? Source of symptoms; would like to have her see sports medicine provider that can do manipulation; may also need PT; patient in agreement; referral updated; Check Varicella titer per patient request; she is unsure if she completed her chicken pox series; follow up to be determined;  This visit occurred during the SARS-CoV-2 public health emergency.  Safety protocols were in place, including screening questions prior to the visit, additional usage of staff PPE, and extensive cleaning of exam room while observing appropriate contact time as indicated for disinfecting solutions.   Time spent 30 minutes reviewing old notes and discussing symptoms/ treatment plan  No follow-ups on file.  Orders Placed This Encounter  Procedures   Varicella zoster antibody, IgG  Requested Prescriptions    No prescriptions requested or ordered in this encounter

## 2022-02-04 LAB — VARICELLA ZOSTER ANTIBODY, IGG: Varicella IgG: 1025 index

## 2022-02-06 ENCOUNTER — Ambulatory Visit: Payer: BC Managed Care – PPO | Admitting: Physical Therapy

## 2022-02-06 ENCOUNTER — Other Ambulatory Visit: Payer: Self-pay

## 2022-02-06 DIAGNOSIS — R252 Cramp and spasm: Secondary | ICD-10-CM | POA: Diagnosis not present

## 2022-02-06 DIAGNOSIS — M6281 Muscle weakness (generalized): Secondary | ICD-10-CM

## 2022-02-06 DIAGNOSIS — R279 Unspecified lack of coordination: Secondary | ICD-10-CM | POA: Diagnosis not present

## 2022-02-06 DIAGNOSIS — R278 Other lack of coordination: Secondary | ICD-10-CM | POA: Diagnosis not present

## 2022-02-06 DIAGNOSIS — R293 Abnormal posture: Secondary | ICD-10-CM | POA: Diagnosis not present

## 2022-02-06 NOTE — Patient Instructions (Signed)

## 2022-02-06 NOTE — Therapy (Signed)
[facility] @ [facility] [address] [place], [place], [address] Phone: [phone]   Fax:  [phone]  Physical Therapy Treatment  Patient Details  Name: Donna [name] MRN: [id number] Date of Birth: [birth year] Referring Provider (PT): Milas Hock, MD   Encounter Date: 02/06/2022   PT End of Session - 02/06/22 1312     Visit Number 4    Date for PT Re-Evaluation 04/06/22    Authorization Type BCBS    PT Start Time 1245   pt arrival time   PT Stop Time 1312    PT Time Calculation (min) 27 min    Activity Tolerance Patient tolerated treatment well;Patient limited by pain    Behavior During Therapy [facility] for tasks assessed/performed             Past Medical History:  Diagnosis Date   Acid reflux    Anemia    History of palpitations     Past Surgical History:  Procedure Laterality Date   UPPER GASTROINTESTINAL ENDOSCOPY  2019    There were no vitals filed for this visit.   Subjective Assessment - 02/06/22 1246     Subjective Pt reports this past week she has had more pain, thinks is usually has 1-2BMs "maybe" per week. Pt report she has been trying to be more aware of regularity and thinks is only having 1-2x per week.    Patient Stated Goals Pelvic sonogram is otherwise unremarkable.                               [facility] Adult PT Treatment/Exercise - 02/06/22 0001       Self-Care   Self-Care Other Self-Care Comments    Other Self-Care Comments  Pt educated on abdominal massage and voiding mechanics, OnNut ring poroduct or similar products for depth control during intercourse as this is what causes most pain per pt.      Manual Therapy   Manual Therapy Soft tissue mobilization    Manual therapy comments Abdominal massage completed in supine x5 CW and x5 CCW and x5 M method completed with pt educated during on how to do this at home as well, for improved bowel regularity and peristalsis                      PT Education - 02/06/22 1312     Education Details Pt educated on abdominal massage and depth control product as an option to decrease pain with vaginal penetration    Person(s) Educated Patient    Methods Explanation;Demonstration;Tactile cues;Verbal cues    Comprehension Verbalized understanding;Returned demonstration              PT Short Term Goals - 12/06/21 1713       PT SHORT TERM GOAL #1   Title pt to be I with HEP    Time 5    Period Weeks    Status New    Target Date 01/10/22      PT SHORT TERM GOAL #2   Title pt to report no more than 5/10 with mobility at pelvis to improve tolerance to exercising and community mobility    Time 5    Period Weeks    Status New    Target Date 01/10/22      PT SHORT TERM GOAL #3   Title pt to demonstrate ability to coordinate pelvic floor and breathing mechanics at  least 50% of the time with activity to improve pelvic floor function.    Time 5    Period Weeks    Status New    Target Date 01/10/22      PT SHORT TERM GOAL #4   Title pt to report improved bowel regularity without need of straining to at least 3 BMs per week.    Time 5    Period Weeks    Status New    Target Date 01/10/22      PT SHORT TERM GOAL #5   Title pt to demonstrate at least 3/5 pelvic floor strength and ability to isometrically hold contractions 5s for improved pelvic floor function    Time 5    Period Weeks    Status New    Target Date 01/10/22               PT Long Term Goals - 12/06/21 1715       PT LONG TERM GOAL #1   Title pt to be I withadvanced  HEP    Time 4    Status New    Target Date 04/06/22      PT LONG TERM GOAL #2   Title pt to report no more than 2/10 with mobility at pelvis to improve tolerance to exercising and community mobility    Time 4    Period Months    Status New    Target Date 04/06/22      PT LONG TERM GOAL #3   Title pt to demonstrate ability to coordinate pelvic floor and  breathing mechanics at least 75% of the time with activity to improve pelvic floor function.    Time 4    Period Months    Status New    Target Date 04/06/22      PT LONG TERM GOAL #4   Title pt to demonstrate at least 4/5 pelvic floor strength and ability to isometrically hold contractions 8s for improved pelvic floor function    Time 4    Status New    Target Date 04/06/22      PT LONG TERM GOAL #5   Title pt to demonstrate ability tos squat 20# from ground level with proper mechanics with no more than 2/10 pain for improved functional mobility    Time 4    Period Months    Status New    Target Date 04/06/22                   Plan - 02/06/22 1313     Clinical Impression Statement Pt presents to clinic reporting she has had more pain this week and has been more aware of bowel habits reporting she usually only has a BM 1-2x per week "maybe" sometimes less. Pt reports she has had abdominal pain and tightness felt and was disappointed after having much less pain after las session. Pt session focused on abdominal massage and education on how to complete this at home, handouts given, and manual for PT completing abdominal massage. Pt tolerated well and also reported she feels most of her pain with intercourse is with depth of penetration and this causes her more pain worrying or anticipating this she feels. Pt also educated on depth control products that may help with this. Pt tolerated well denied additional questions a sthese were her biggest concerns. Pt would benefit from additional PT to further address deficits    Personal Factors and Comorbidities Time since onset of injury/illness/exacerbation  Examination-Activity Limitations Lift;Squat;Locomotion Level    Examination-Participation Restrictions Interpersonal Relationship;Community Activity;Shop;Occupation    Stability/Clinical Decision Making Evolving/Moderate complexity    Rehab Potential Good    PT Frequency 1x / week     PT Duration Other (comment)   10 weeks   PT Treatment/Interventions ADLs/Self Care Home Management;Aquatic Therapy;Functional mobility training;Therapeutic activities;Therapeutic exercise;Neuromuscular re-education;Manual techniques;Patient/family education;Taping;Scar mobilization;Passive range of motion;Energy conservation    PT Next Visit Plan stretching at hips, breathing mechanics, manual at hips and abdomen    PT Home Exercise Plan [email or web address]    Consulted and Agree with Plan of Care Patient             Patient will benefit from skilled therapeutic intervention in order to improve the following deficits and impairments:  Decreased coordination, Decreased endurance, Pain, Impaired flexibility, Improper body mechanics, Postural dysfunction, Decreased strength, Decreased mobility, Impaired tone, Increased fascial restricitons  Visit Diagnosis: Muscle weakness (generalized)  Other lack of coordination     Problem List Patient Active Problem List   Diagnosis Date Noted   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 [place], [place], [address] Phone: [phone]   Fax:  [phone]  Name: Donna [name] MRN: [id number] Date of Birth: 1991-10-24

## 2022-02-13 ENCOUNTER — Other Ambulatory Visit: Payer: Self-pay

## 2022-02-13 ENCOUNTER — Ambulatory Visit: Payer: BC Managed Care – PPO | Admitting: Physical Therapy

## 2022-02-13 DIAGNOSIS — M6281 Muscle weakness (generalized): Secondary | ICD-10-CM | POA: Diagnosis not present

## 2022-02-13 DIAGNOSIS — R293 Abnormal posture: Secondary | ICD-10-CM | POA: Diagnosis not present

## 2022-02-13 DIAGNOSIS — R252 Cramp and spasm: Secondary | ICD-10-CM | POA: Diagnosis not present

## 2022-02-13 DIAGNOSIS — R278 Other lack of coordination: Secondary | ICD-10-CM | POA: Diagnosis not present

## 2022-02-13 DIAGNOSIS — R279 Unspecified lack of coordination: Secondary | ICD-10-CM | POA: Diagnosis not present

## 2022-02-13 NOTE — Therapy (Signed)
[facility] @ [facility] [address] [place], [place], [address] Phone: [phone]   Fax:  [phone]  Physical Therapy Treatment  Patient Details  Name: Donna [name] MRN: [id number] Date of Birth: Dec 04, 1991 Referring Provider (PT): Milas Hock, MD   Encounter Date: 02/13/2022   PT End of Session - 02/13/22 1318     Date for PT Re-Evaluation 04/06/22    Authorization Type BCBS    PT Start Time 1238   pt arrival time   PT Stop Time 1316    PT Time Calculation (min) 38 min    Activity Tolerance Patient tolerated treatment well;Patient limited by pain    Behavior During Therapy [facility] for tasks assessed/performed             Past Medical History:  Diagnosis Date   Acid reflux    Anemia    History of palpitations     Past Surgical History:  Procedure Laterality Date   UPPER GASTROINTESTINAL ENDOSCOPY  2019    There were no vitals filed for this visit.   Subjective Assessment - 02/13/22 1240     Subjective Pt reports she is on her period and has some cramping with this and prior to this was not having a lot pain. Pt reports stretching has been helping overall.    Patient Stated Goals Pelvic sonogram is otherwise unremarkable.    Currently in Pain? No/denies   took advil prior to PT from cramping                              [facility] Adult PT Treatment/Exercise - 02/13/22 0001       Lumbar Exercises: Stretches   Other Lumbar Stretch Exercise foam roller for thoracic extension 3x45s; thoracic openers x10 each side      Lumbar Exercises: Standing   Other Standing Lumbar Exercises deep squat stretch 2x45s    Other Standing Lumbar Exercises standing on relax setting of power plate with pt reporting this helped her feel like she could relax pelvic floor better      Lumbar Exercises: Seated   Other Seated Lumbar Exercises happy baby on ball x30s;    Other Seated Lumbar Exercises pelvic tilts  sitting on ball x10 Rt/Lt, ant/post, figure 8s, hip shifts x10 each      Manual Therapy   Manual Therapy Scapular mobilization;Myofascial release    Manual therapy comments abdominal massage x5                       PT Short Term Goals - 12/06/21 1713       PT SHORT TERM GOAL #1   Title pt to be I with HEP    Time 5    Period Weeks    Status New    Target Date 01/10/22      PT SHORT TERM GOAL #2   Title pt to report no more than 5/10 with mobility at pelvis to improve tolerance to exercising and community mobility    Time 5    Period Weeks    Status New    Target Date 01/10/22      PT SHORT TERM GOAL #3   Title pt to demonstrate ability to coordinate pelvic floor and breathing mechanics at least 50% of the time with activity to improve pelvic floor function.    Time 5    Period Weeks  Status New    Target Date 01/10/22      PT SHORT TERM GOAL #4   Title pt to report improved bowel regularity without need of straining to at least 3 BMs per week.    Time 5    Period Weeks    Status New    Target Date 01/10/22      PT SHORT TERM GOAL #5   Title pt to demonstrate at least 3/5 pelvic floor strength and ability to isometrically hold contractions 5s for improved pelvic floor function    Time 5    Period Weeks    Status New    Target Date 01/10/22               PT Long Term Goals - 12/06/21 1715       PT LONG TERM GOAL #1   Title pt to be I withadvanced  HEP    Time 4    Status New    Target Date 04/06/22      PT LONG TERM GOAL #2   Title pt to report no more than 2/10 with mobility at pelvis to improve tolerance to exercising and community mobility    Time 4    Period Months    Status New    Target Date 04/06/22      PT LONG TERM GOAL #3   Title pt to demonstrate ability to coordinate pelvic floor and breathing mechanics at least 75% of the time with activity to improve pelvic floor function.    Time 4    Period Months    Status New     Target Date 04/06/22      PT LONG TERM GOAL #4   Title pt to demonstrate at least 4/5 pelvic floor strength and ability to isometrically hold contractions 8s for improved pelvic floor function    Time 4    Status New    Target Date 04/06/22      PT LONG TERM GOAL #5   Title pt to demonstrate ability tos squat 20# from ground level with proper mechanics with no more than 2/10 pain for improved functional mobility    Time 4    Period Months    Status New    Target Date 04/06/22                   Plan - 02/13/22 1316     Clinical Impression Statement Pt presents to clinic reporting the last week had been farily pain free, now on period and having cramping and has not done as much at home since starting. Pt session focused on pelvic relaxation and spinal and hip stretching for improved tension, manual work at abdominal massage completed for improved bowel regularity and pt reported this has helped a lot at home. Pt tolerated well and improving overall flexibility and reports less discomfort at pelvis.  Pt tolerated well denied additional questions a sthese were her biggest concerns. Pt would benefit from additional PT to further address deficits    Personal Factors and Comorbidities Time since onset of injury/illness/exacerbation    Examination-Activity Limitations Lift;Squat;Locomotion Level    Examination-Participation Restrictions Interpersonal Relationship;Community Activity;Shop;Occupation    Stability/Clinical Decision Making Evolving/Moderate complexity    Rehab Potential Good    PT Frequency 1x / week    PT Duration Other (comment)   10 weeks   PT Treatment/Interventions ADLs/Self Care Home Management;Aquatic Therapy;Functional mobility training;Therapeutic activities;Therapeutic exercise;Neuromuscular re-education;Manual techniques;Patient/family education;Taping;Scar mobilization;Passive range of motion;Energy conservation  PT Next Visit Plan stretching at hips, breathing  mechanics, manual at hips and abdomen    PT Home Exercise Plan [id number]    Consulted and Agree with Plan of Care Patient             Patient will benefit from skilled therapeutic intervention in order to improve the following deficits and impairments:  Decreased coordination, Decreased endurance, Pain, Impaired flexibility, Improper body mechanics, Postural dysfunction, Decreased strength, Decreased mobility, Impaired tone, Increased fascial restricitons  Visit Diagnosis: Cramp and spasm  Abnormal posture     Problem List Patient Active Problem List   Diagnosis Date Noted   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 [place], [place], [address] Phone: [phone]   Fax:  [phone]  Name: Donna [name] MRN: [id number] Date of Birth: 1991/02/19

## 2022-02-18 ENCOUNTER — Other Ambulatory Visit: Payer: Self-pay

## 2022-02-18 ENCOUNTER — Ambulatory Visit
Admission: RE | Admit: 2022-02-18 | Discharge: 2022-02-18 | Disposition: A | Discharge status: Home / Self Care | Payer: BC Managed Care – PPO | Source: Ambulatory Visit | Attending: Obstetrics and Gynecology | Admitting: Obstetrics and Gynecology

## 2022-02-18 ENCOUNTER — Telehealth: Payer: Self-pay | Admitting: Obstetrics and Gynecology

## 2022-02-18 DIAGNOSIS — N923 Ovulation bleeding: Secondary | ICD-10-CM

## 2022-02-18 DIAGNOSIS — N930 Postcoital and contact bleeding: Secondary | ICD-10-CM

## 2022-02-18 NOTE — Telephone Encounter (Signed)
Spoke with Malachi Bonds. Reviewed findings of 4 polyps. Discussed this is clear cause of her bleeding. Recommend removal and ideally before she TTC. She is agreeable to this plan. Surgery would be D&C, hysteroscopy and polypectomy. Would avoid curettage overall as she plans to have children.   Reviewed LMP (2/12) and ideally timing for polypectomy in this setting. Reviewed myosure to minimize trauma to uterus.   She would like to proceed in March. Reviewed she can reach out with additional questions as she continues to process following her SIS.

## 2022-02-18 NOTE — Progress Notes (Signed)
[facility] called to verify orders for visit today. Pt was scheduled for Hysterogram. Pt told staff at GI she was to have [place] with saline. Reviewed with Para March, MD who states patient should be scheduled for sonohysterogram. Pt will contact their office to reschedule once she has started next period.

## 2022-02-20 ENCOUNTER — Other Ambulatory Visit: Payer: Self-pay

## 2022-02-20 ENCOUNTER — Ambulatory Visit: Payer: BC Managed Care – PPO | Admitting: Physical Therapy

## 2022-02-20 ENCOUNTER — Other Ambulatory Visit: Payer: BC Managed Care – PPO

## 2022-02-20 DIAGNOSIS — R278 Other lack of coordination: Secondary | ICD-10-CM | POA: Diagnosis not present

## 2022-02-20 DIAGNOSIS — R252 Cramp and spasm: Secondary | ICD-10-CM | POA: Diagnosis not present

## 2022-02-20 DIAGNOSIS — R293 Abnormal posture: Secondary | ICD-10-CM | POA: Diagnosis not present

## 2022-02-20 DIAGNOSIS — R279 Unspecified lack of coordination: Secondary | ICD-10-CM | POA: Diagnosis not present

## 2022-02-20 DIAGNOSIS — M6281 Muscle weakness (generalized): Secondary | ICD-10-CM | POA: Diagnosis not present

## 2022-02-20 NOTE — Therapy (Signed)
[facility] @ [facility] [address] [place], [place], [address] Phone: [phone]   Fax:  [phone]  Physical Therapy Treatment  Patient Details  Name: Donna [name] MRN: [id number] Date of Birth: 1991/04/02 Referring Provider (PT): Milas Hock, MD   Encounter Date: 02/20/2022   PT End of Session - 02/20/22 1234     Visit Number 6    Date for PT Re-Evaluation 04/06/22    Authorization Type BCBS    PT Start Time 1234    PT Stop Time 1313    PT Time Calculation (min) 39 min    Activity Tolerance Patient tolerated treatment well;Patient limited by pain    Behavior During Therapy [facility] for tasks assessed/performed             Past Medical History:  Diagnosis Date   Acid reflux    Anemia    History of palpitations     Past Surgical History:  Procedure Laterality Date   UPPER GASTROINTESTINAL ENDOSCOPY  2019    There were no vitals filed for this visit.   Subjective Assessment - 02/20/22 1235     Subjective Pt reports she had her ultrasound transvaginally and found three polyps that they plan to remove however other than the ultrasound has had improvement in pain overall "I really wasn't having any pain until the ultrasound".    Patient Stated Goals Pelvic sonogram is otherwise unremarkable.    Currently in Pain? Yes    Pain Score 3     Pain Location Pelvis    Pain Orientation Mid   labial region   Pain Descriptors / Indicators Aching;Dull    Pain Type Chronic pain                            Pelvic Floor Special Questions - 02/20/22 0001     Pelvic Floor Internal Exam patient identified and patient confirms consent for PT to perform internal soft tissue work and muscle strength and integrity assessment    Exam Type Vaginal    Sensation WFL    Palpation no TTP    Strength weak squeeze, no lift   Pt demonstrated muscle activation but with downward mobility and no lift however this may be due to  poor coordination instead of true weakness.   Strength # of reps 0    Strength # of seconds 0    Tone WFL               [facility] Adult PT Treatment/Exercise - 02/20/22 0001       Self-Care   Self-Care Other Self-Care Comments    Other Self-Care Comments  pt educated on techniques for improved feedback on pelvic floor mobility at home with visual feedback or internal with finger or during intercourse if pt is comfortable with this. Pt reported she feels like her being able to see with mirror feedback would be more helpful and agreeable to attempt this.      Neuro Re-ed    Neuro Re-ed Details  pt directed in several reps of pelvic floor contraction with pt demonstrating downward push and abdominal bulge initially with attempts to contact. Pt benefited from breaking down movement of contraction to only "closing vaginal opening" instead of attempt contraction with lift upward. Pt able to improveTA activation to stop abdominal bulge and able to coordinate this with breathing for exhale however unable to demonstrate this with pelvic contraction. Pt unable to  lift pelvic floor therefore unable to achieve 3/5 strength, and multiple attempts made for techniques to improve however pt unable to complete lift without actually pushing downward. Pt denied all pain and overall demonstrated improved tone overall.                       PT Short Term Goals - 12/06/21 1713       PT SHORT TERM GOAL #1   Title pt to be I with HEP    Time 5    Period Weeks    Status New    Target Date 01/10/22      PT SHORT TERM GOAL #2   Title pt to report no more than 5/10 with mobility at pelvis to improve tolerance to exercising and community mobility    Time 5    Period Weeks    Status New    Target Date 01/10/22      PT SHORT TERM GOAL #3   Title pt to demonstrate ability to coordinate pelvic floor and breathing mechanics at least 50% of the time with activity to improve pelvic floor function.     Time 5    Period Weeks    Status New    Target Date 01/10/22      PT SHORT TERM GOAL #4   Title pt to report improved bowel regularity without need of straining to at least 3 BMs per week.    Time 5    Period Weeks    Status New    Target Date 01/10/22      PT SHORT TERM GOAL #5   Title pt to demonstrate at least 3/5 pelvic floor strength and ability to isometrically hold contractions 5s for improved pelvic floor function    Time 5    Period Weeks    Status New    Target Date 01/10/22               PT Long Term Goals - 12/06/21 1715       PT LONG TERM GOAL #1   Title pt to be I withadvanced  HEP    Time 4    Status New    Target Date 04/06/22      PT LONG TERM GOAL #2   Title pt to report no more than 2/10 with mobility at pelvis to improve tolerance to exercising and community mobility    Time 4    Period Months    Status New    Target Date 04/06/22      PT LONG TERM GOAL #3   Title pt to demonstrate ability to coordinate pelvic floor and breathing mechanics at least 75% of the time with activity to improve pelvic floor function.    Time 4    Period Months    Status New    Target Date 04/06/22      PT LONG TERM GOAL #4   Title pt to demonstrate at least 4/5 pelvic floor strength and ability to isometrically hold contractions 8s for improved pelvic floor function    Time 4    Status New    Target Date 04/06/22      PT LONG TERM GOAL #5   Title pt to demonstrate ability tos squat 20# from ground level with proper mechanics with no more than 2/10 pain for improved functional mobility    Time 4    Period Months    Status New    Target Date  04/06/22                   Plan - 02/20/22 1324     Clinical Impression Statement Pt session focused on internal vaginal treatment for improved feedback and pt understanding of pelvic floor contraction/relaxation. Pt demonstrated improved tone overall and had no pain. Pt however very limited in ability to  coordinate pelvic floor wiht demonstrated downward push with attempts to contract and various compensatory strategies noted. Pt able to improve with coordination of TA and breathing however less movement atpelvic floor noted. Pt educated on techniques for further feedback at home and interested and motivated to attempt visual feedback with mirror. Pt tolerated well and continues to demonstrate need of PT for further improvement with pelvic floor mobility.    Personal Factors and Comorbidities Time since onset of injury/illness/exacerbation    Examination-Activity Limitations Lift;Squat;Locomotion Level    Examination-Participation Restrictions Interpersonal Relationship;Community Activity;Shop;Occupation    Stability/Clinical Decision Making Evolving/Moderate complexity    Rehab Potential Good    PT Frequency 1x / week    PT Duration Other (comment)   10 weeks   PT Treatment/Interventions ADLs/Self Care Home Management;Aquatic Therapy;Functional mobility training;Therapeutic activities;Therapeutic exercise;Neuromuscular re-education;Manual techniques;Patient/family education;Taping;Scar mobilization;Passive range of motion;Energy conservation    PT Next Visit Plan stretching at hips, breathing mechanics, manual at hips and abdomen    PT Home Exercise Plan [id number]    Consulted and Agree with Plan of Care Patient             Patient will benefit from skilled therapeutic intervention in order to improve the following deficits and impairments:  Decreased coordination, Decreased endurance, Pain, Impaired flexibility, Improper body mechanics, Postural dysfunction, Decreased strength, Decreased mobility, Impaired tone, Increased fascial restricitons  Visit Diagnosis: Muscle weakness (generalized)  Cramp and spasm  Other lack of coordination     Problem List Patient Active Problem List   Diagnosis Date Noted   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 Patient is motivated to learn. Patient understands and agrees with treatment goals and plan. PT explains patient will be examined in standing, sitting, and lying down to see how their muscles and joints work. When they are ready, they will be asked to remove their underwear so PT can examine their perineum. The patient is also given the option of providing their own chaperone as one is not provided in our facility. The patient also has the right and is explained the right to defer or refuse any part of the evaluation or treatment including the internal exam. With the patient's consent, PT will use one gloved finger to gently assess the muscles of the pelvic floor, seeing how well it contracts and relaxes and if there is muscle symmetry. After, the patient will get dressed and PT and patient will discuss exam findings and plan of care. PT and patient discuss plan of care, schedule, attendance policy and HEP activities.   Otelia Sergeant, PT, DPT 02/22/231:27 PM   [facility] @ [facility] [address] [place], [place], [address] Phone: [phone]   Fax:  [phone]  Name: Jaonna Word MRN: [id number] Date of Birth: Feb 07, 1991

## 2022-02-26 ENCOUNTER — Encounter: Payer: Self-pay | Admitting: Obstetrics and Gynecology

## 2022-02-27 ENCOUNTER — Other Ambulatory Visit: Payer: Self-pay

## 2022-02-27 ENCOUNTER — Ambulatory Visit: Payer: BC Managed Care – PPO | Attending: Obstetrics and Gynecology | Admitting: Physical Therapy

## 2022-02-27 ENCOUNTER — Encounter: Payer: Self-pay | Admitting: Physical Therapy

## 2022-02-27 DIAGNOSIS — R293 Abnormal posture: Secondary | ICD-10-CM | POA: Diagnosis not present

## 2022-02-27 DIAGNOSIS — R278 Other lack of coordination: Secondary | ICD-10-CM | POA: Diagnosis not present

## 2022-02-27 DIAGNOSIS — R252 Cramp and spasm: Secondary | ICD-10-CM | POA: Diagnosis not present

## 2022-02-27 DIAGNOSIS — M62838 Other muscle spasm: Secondary | ICD-10-CM | POA: Diagnosis not present

## 2022-02-27 DIAGNOSIS — M6281 Muscle weakness (generalized): Secondary | ICD-10-CM | POA: Diagnosis not present

## 2022-02-27 NOTE — Progress Notes (Signed)
?Charlann Boxer D.O. ?[facility] ?[place] ?Phone: [phone] ?Subjective:   ?I, Donna [name], am serving as a scribe for Dr. Hulan Saas. ? ?This visit occurred during the SARS-CoV-2 public health emergency.  Safety protocols were in place, including screening questions prior to the visit, additional usage of staff PPE, and extensive cleaning of exam room while observing appropriate contact time as indicated for disinfecting solutions.  ? ? ?I'm seeing this patient by the request  of:  Marrian Salvage, FNP ? ?CC: back and left sided body pain  ? ?[id number]  ?Donna [name] is a 31 y.o. female coming in with complaint of pain in multiple joints on L side of body with neck and ankle being the most painful recently.  ? ?L neck and trap will be tight and then L shoulder will be cold. Lying on L shoulder bothers her.  ? ?L sided lumbar spine and SI joint pain as well. Pain is achy in nature. Worse when she sits for a prolonged period. L hip pain over GT. Does stretch IB and she will feel pain. Patient is doing pelvic floor PT at [facility]. Is scheduled for hysterectomy on 03/19/2022. ? ?L knee pain in anterior portion of knee. Injured knee 10 years ago.  ? ?L ankle pain. Xray in 2016 and was given ankle brace.  ? ?Patient uses Advil for headaches and cramps but does not use it otherwise.  ? ? ? ? ?  ? ?Past Medical History:  ?Diagnosis Date  ? Acid reflux   ? Anemia   ? History of palpitations   ? ?Past Surgical History:  ?Procedure Laterality Date  ? UPPER GASTROINTESTINAL ENDOSCOPY  2019  ? ?Social History  ? ?Socioeconomic History  ? Marital status: Married  ?  Spouse name: Not on file  ? Number of children: Not on file  ? Years of education: Not on file  ? Highest education level: Bachelor's degree (e.g., BA, AB, BS)  ?Occupational History  ?  Comment: [employer]  ?Tobacco Use  ? Smoking status: Never  ? Smokeless tobacco: Never  ?Substance and Sexual Activity  ?  Alcohol use: Yes  ?  Comment: occasionally  ? Drug use: Never  ? Sexual activity: Yes  ?  Partners: Male  ?Other Topics Concern  ? Not on file  ?Social History Narrative  ? Not on file  ? ?Social Determinants of Health  ? ?Financial Resource Strain: Not on file  ?Food Insecurity: Not on file  ?Transportation Needs: Not on file  ?Physical Activity: Not on file  ?Stress: Not on file  ?Social Connections: Not on file  ? ?Allergies  ?Allergen Reactions  ? Penicillin G Rash  ? Sulfamethoxazole Rash  ? ?Family History  ?Problem Relation Age of Onset  ? Cancer Maternal Grandfather   ?     Prostate  ? Diabetes Brother   ?     type 1  ? Hypertension Neg Hx   ? ? ? ? ? ? ?Current Outpatient Medications (Hematological):  ?  ferrous sulfate 325 (65 FE) MG tablet, Take 325 mg by mouth daily with breakfast. ? ?Current Outpatient Medications (Other):  ?  Prenatal Vit-Fe Fumarate-FA (MULTIVITAMIN-PRENATAL) 27-0.8 MG TABS tablet, Take 1 tablet by mouth daily at 12 noon. ? ? ?Reviewed prior external information including notes and imaging from  ?primary care provider ?As well as notes that were available from care everywhere and other healthcare systems. ? ?Past medical history, social,  surgical and family history all reviewed in electronic medical record.  No pertanent information unless stated regarding to the chief complaint.  ? ?Review of Systems: ? No headache, visual changes, nausea, vomiting, diarrhea, constipation, dizziness, abdominal pain, skin rash, fevers, chills, night sweats, weight loss, swollen lymph nodes, body aches, joint swelling, chest pain, shortness of breath, mood changes. POSITIVE muscle aches ? ?Objective  ?Blood pressure 108/82, pulse 74, height 5\' 3"  (1.6 m), weight 133 lb (60.3 kg), SpO2 97 %. ?  ?General: No apparent distress alert and oriented x3 mood and affect normal, dressed appropriately.  ?HEENT: Pupils equal, extraocular movements intact  ?Respiratory: Patient's speak in full sentences and does  not appear short of breath  ?Cardiovascular: No lower extremity edema, non tender, no erythema  ?Gait normal with good balance and coordination.  ?MSK:   ?Low back exam shows loss of lordosis TTP over left SI joint ?Tightness in TL juncture left side.  ?Neg SLT ?Neg FABER ?Neck neg spurling tightness with sidebending left and rotation right  ? ? ?Osteopathic findings ?C4 flexed rotated and side bent left ?C6 flexed rotated and side bent left ?T9 extended rotated and side bent left inhaled rib ?L2 flexed rotated and side bent right ?Sacrum left on left  ? ? ?  ?Impression and Recommendations:  ?  ? ?The above documentation has been reviewed and is accurate and complete Lyndal Pulley, DO ? ? ? ?

## 2022-02-27 NOTE — Patient Instructions (Signed)

## 2022-02-27 NOTE — Therapy (Signed)
[place] ?[facility] ?[address] ?[place], [place], [address] ?Phone: [phone]   Fax:  [phone] ? ?Physical Therapy Treatment ? ?Patient Details  ?Name: Donna [name] ?MRN: [id number] ?Date of Birth: 02-16-1991 ?Referring Provider (PT): Milas Hock, MD ? ? ?Encounter Date: 02/27/2022 ? ? PT End of Session - 02/27/22 1232   ? ? Visit Number 7   ? Date for PT Re-Evaluation 04/06/22   ? Authorization Type BCBS   ? PT Start Time 1231   ? PT Stop Time 1310   ? PT Time Calculation (min) 39 min   ? Activity Tolerance Patient tolerated treatment well;Patient limited by pain   ? Behavior During Therapy [facility] for tasks assessed/performed   ? ?  ?  ? ?  ? ? ?Past Medical History:  ?Diagnosis Date  ? Acid reflux   ? Anemia   ? History of palpitations   ? ? ?Past Surgical History:  ?Procedure Laterality Date  ? UPPER GASTROINTESTINAL ENDOSCOPY  2019  ? ? ?There were no vitals filed for this visit. ? ? ? ? ? ? ? ? ? ? ? ? ? ? ? ? ? ? Pelvic Floor Special Questions - 02/27/22 0001   ? ? Prolapse None   ? Pelvic Floor Internal Exam patient identified and patient confirms consent for PT to perform internal soft tissue work and muscle strength and integrity assessment   ? Exam Type Vaginal   ? Sensation WFL   ? Palpation TTP at Rt bulbocavernosus initially but improved   ? Strength weak squeeze, no lift   Pt demonstrated muscle activation but with downward mobility and no lift however this may be due to poor coordination instead of true weakness.  ? Strength # of reps 4   ? Strength # of seconds 2   ? Tone WFL   ? ?  ?  ? ?  ? ? ? ? [facility] Adult PT Treatment/Exercise - 02/27/22 0001   ? ?  ? Neuro Re-ed   ? Neuro Re-ed Details  PT directed in 2x10 pelvic floor contractions with internal vaginal treatment with quick release technique used to improve muscle recruitment. Pt did demonstrate improved ability to relax pelvic floor and intermittent contractions felt with max VC and TC   ?  ?  Manual Therapy  ? Manual Therapy Internal Pelvic Floor   ? Manual therapy comments Manual work to decrease trigger points at Rt bulbocavernosus and pubococcygeus with moderate effect, release felt and pt reported feeling no more pain however mild tightness still felt.   ? ?  ?  ? ?  ? ? ? ? ? ? ? ? ? ? ? ? PT Short Term Goals - 12/06/21 1713   ? ?  ? PT SHORT TERM GOAL #1  ? Title pt to be I with HEP   ? Time 5   ? Period Weeks   ? Status New   ? Target Date 01/10/22   ?  ? PT SHORT TERM GOAL #2  ? Title pt to report no more than 5/10 with mobility at pelvis to improve tolerance to exercising and community mobility   ? Time 5   ? Period Weeks   ? Status New   ? Target Date 01/10/22   ?  ? PT SHORT TERM GOAL #3  ? Title pt to demonstrate ability to coordinate pelvic floor and breathing mechanics at least 50% of the time with activity to improve  pelvic floor function.   ? Time 5   ? Period Weeks   ? Status New   ? Target Date 01/10/22   ?  ? PT SHORT TERM GOAL #4  ? Title pt to report improved bowel regularity without need of straining to at least 3 BMs per week.   ? Time 5   ? Period Weeks   ? Status New   ? Target Date 01/10/22   ?  ? PT SHORT TERM GOAL #5  ? Title pt to demonstrate at least 3/5 pelvic floor strength and ability to isometrically hold contractions 5s for improved pelvic floor function   ? Time 5   ? Period Weeks   ? Status New   ? Target Date 01/10/22   ? ?  ?  ? ?  ? ? ? ? PT Long Term Goals - 12/06/21 1715   ? ?  ? PT LONG TERM GOAL #1  ? Title pt to be I withadvanced  HEP   ? Time 4   ? Status New   ? Target Date 04/06/22   ?  ? PT LONG TERM GOAL #2  ? Title pt to report no more than 2/10 with mobility at pelvis to improve tolerance to exercising and community mobility   ? Time 4   ? Period Months   ? Status New   ? Target Date 04/06/22   ?  ? PT LONG TERM GOAL #3  ? Title pt to demonstrate ability to coordinate pelvic floor and breathing mechanics at least 75% of the time with activity to improve  pelvic floor function.   ? Time 4   ? Period Months   ? Status New   ? Target Date 04/06/22   ?  ? PT LONG TERM GOAL #4  ? Title pt to demonstrate at least 4/5 pelvic floor strength and ability to isometrically hold contractions 8s for improved pelvic floor function   ? Time 4   ? Status New   ? Target Date 04/06/22   ?  ? PT LONG TERM GOAL #5  ? Title pt to demonstrate ability tos squat 20# from ground level with proper mechanics with no more than 2/10 pain for improved functional mobility   ? Time 4   ? Period Months   ? Status New   ? Target Date 04/06/22   ? ?  ?  ? ?  ? ? ? ? ? ? ? ? Plan - 02/27/22 1257   ? ? Clinical Impression Statement Pt session focused on internal vaginal pelvic treatment with pt demonstrating improved muscle activation intermittently able to conract with quick release technique and balloon blowing technique for improved activiation of core to recruit pelvic floor with good effect then pt able to contract with reps for kegels. Pt not consistently able to contract but is able to relax and bulge correctly. Pt had pain at initial insertion of glover digit at Rt bulbocavernosus and manual work completed here with pt reported decreased pain post this. Pt tolerated well without additional pain and reports better understanding of how to contract. Pt tolerated well and continues to demonstrate need of PT for further improvement with pelvic floor mobility.   ? Personal Factors and Comorbidities Time since onset of injury/illness/exacerbation   ? Examination-Activity Limitations Lift;Squat;Locomotion Level   ? Examination-Participation Restrictions Interpersonal Relationship;Community Activity;Shop;Occupation   ? Stability/Clinical Decision Making Evolving/Moderate complexity   ? Rehab Potential Good   ? PT Frequency 1x /  week   ? PT Duration Other (comment)   10 weeks  ? PT Treatment/Interventions ADLs/Self Care Home Management;Aquatic Therapy;Functional mobility training;Therapeutic  activities;Therapeutic exercise;Neuromuscular re-education;Manual techniques;Patient/family education;Taping;Scar mobilization;Passive range of motion;Energy conservation   ? PT Next Visit Plan stretching at hips, breathing mechanics, manual at hips and abdomen   ? PT Home Exercise Plan [id number]   ? Consulted and Agree with Plan of Care Patient   ? ?  ?  ? ?  ? ? ?Patient will benefit from skilled therapeutic intervention in order to improve the following deficits and impairments:  Decreased coordination, Decreased endurance, Pain, Impaired flexibility, Improper body mechanics, Postural dysfunction, Decreased strength, Decreased mobility, Impaired tone, Increased fascial restricitons ? ?Visit Diagnosis: ?Muscle weakness (generalized) ? ?Abnormal posture ? ?Cramp and spasm ? ?Other lack of coordination ? ? ? ? ?Problem List ?Patient Active Problem List  ? Diagnosis Date Noted  ? 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 Abnormal CT of brain 12/15/2018  ? Migraine without aura and without status migrainosus, not intractable 12/15/2018  ? Anxiety 05/22/2018  ? Palpitations 05/18/2018  ? Globus sensation 01/15/2017  ? ? ?Otelia Sergeant, PT, DPT ?03/01/231:25 PM  ? ?[place] ?[facility] ?[address] ?[place], [place], [address] ?Phone: [phone]   Fax:  [phone] ? ?Name: Donna [name] ?MRN: [id number] ?Date of Birth: [birth year] ? ? ? ?

## 2022-02-28 ENCOUNTER — Ambulatory Visit: Payer: BC Managed Care – PPO | Admitting: Family Medicine

## 2022-02-28 ENCOUNTER — Encounter: Payer: Self-pay | Admitting: Family Medicine

## 2022-02-28 ENCOUNTER — Other Ambulatory Visit: Payer: Self-pay

## 2022-02-28 VITALS — BP 108/82 | HR 74 | Ht 63.0 in | Wt 133.0 lb

## 2022-02-28 DIAGNOSIS — M533 Sacrococcygeal disorders, not elsewhere classified: Secondary | ICD-10-CM | POA: Diagnosis not present

## 2022-02-28 DIAGNOSIS — M9902 Segmental and somatic dysfunction of thoracic region: Secondary | ICD-10-CM

## 2022-02-28 DIAGNOSIS — M9908 Segmental and somatic dysfunction of rib cage: Secondary | ICD-10-CM | POA: Diagnosis not present

## 2022-02-28 DIAGNOSIS — M2550 Pain in unspecified joint: Secondary | ICD-10-CM

## 2022-02-28 DIAGNOSIS — M9903 Segmental and somatic dysfunction of lumbar region: Secondary | ICD-10-CM

## 2022-02-28 DIAGNOSIS — M9904 Segmental and somatic dysfunction of sacral region: Secondary | ICD-10-CM | POA: Diagnosis not present

## 2022-02-28 DIAGNOSIS — M9901 Segmental and somatic dysfunction of cervical region: Secondary | ICD-10-CM

## 2022-02-28 LAB — COMPREHENSIVE METABOLIC PANEL
ALT: 8 U/L (ref 0–35)
AST: 14 U/L (ref 0–37)
Albumin: 4.5 g/dL (ref 3.5–5.2)
Alkaline Phosphatase: 40 U/L (ref 39–117)
BUN: 9 mg/dL (ref 6–23)
CO2: 26 mEq/L (ref 19–32)
Calcium: 9.4 mg/dL (ref 8.4–10.5)
Chloride: 104 mEq/L (ref 96–112)
Creatinine, Ser: 0.74 mg/dL (ref 0.40–1.20)
GFR: 109.24 mL/min (ref >60.00)
Glucose, Bld: 90 mg/dL (ref 70–99)
Potassium: 4.2 mEq/L (ref 3.5–5.1)
Sodium: 137 mEq/L (ref 135–145)
Total Bilirubin: 0.9 mg/dL (ref 0.2–1.2)
Total Protein: 7.1 g/dL (ref 6.0–8.3)

## 2022-02-28 LAB — CBC WITH DIFFERENTIAL/PLATELET
Basophils Absolute: 0 10*3/uL (ref 0.0–0.1)
Basophils Relative: 0.8 % (ref 0.0–3.0)
Eosinophils Absolute: 0 10*3/uL (ref 0.0–0.7)
Eosinophils Relative: 0.6 % (ref 0.0–5.0)
HCT: 39.2 % (ref 36.0–46.0)
Hemoglobin: 13.1 g/dL (ref 12.0–15.0)
Lymphocytes Relative: 31.7 % (ref 12.0–46.0)
Lymphs Abs: 1.5 10*3/uL (ref 0.7–4.0)
MCHC: 33.3 g/dL (ref 30.0–36.0)
MCV: 92.9 fl (ref 78.0–100.0)
Monocytes Absolute: 0.3 10*3/uL (ref 0.1–1.0)
Monocytes Relative: 7.5 % (ref 3.0–12.0)
Neutro Abs: 2.7 10*3/uL (ref 1.4–7.7)
Neutrophils Relative %: 59.4 % (ref 43.0–77.0)
Platelets: 258 10*3/uL (ref 150.0–400.0)
RBC: 4.22 Mil/uL (ref 3.87–5.11)
RDW: 13.3 % (ref 11.5–15.5)
WBC: 4.6 10*3/uL (ref 4.0–10.5)

## 2022-02-28 LAB — SEDIMENTATION RATE: Sed Rate: 6 mm/hr (ref 0–20)

## 2022-02-28 LAB — IBC PANEL
Iron: 102 ug/dL (ref 42–145)
Saturation Ratios: 29.4 % (ref 20.0–50.0)
TIBC: 347.2 ug/dL (ref 250.0–450.0)
Transferrin: 248 mg/dL (ref 212.0–360.0)

## 2022-02-28 LAB — URIC ACID: Uric Acid, Serum: 4.1 mg/dL (ref 2.4–7.0)

## 2022-02-28 LAB — FERRITIN: Ferritin: 6.6 ng/mL — ABNORMAL LOW (ref 10.0–291.0)

## 2022-02-28 LAB — TSH: TSH: 2.27 u[IU]/mL (ref 0.35–5.50)

## 2022-02-28 LAB — VITAMIN D 25 HYDROXY (VIT D DEFICIENCY, FRACTURES): VITD: 25.21 ng/mL — ABNORMAL LOW (ref 30.00–100.00)

## 2022-02-28 LAB — C-REACTIVE PROTEIN: CRP: <1 mg/dL (ref 0.5–20.0)

## 2022-02-28 NOTE — Assessment & Plan Note (Signed)
Decision today to treat with OMT was based on Physical Exam ? ?After verbal consent patient was treated with HVLA, ME techniques in cervical, thoracic, rib, lumbar, sacral areas ? ?Patient tolerated the procedure well with improvement in symptoms ? ?Patient given exercises, stretches and lifestyle modifications ? ?See medications in patient instructions if given ? ?Patient will follow up in 4-6 weeks ? ?

## 2022-02-28 NOTE — Patient Instructions (Addendum)
Labs today ?Turmeric 500mg  daily  ?Tart cherry extract 1200mg  at night ?Vitamin D 2000 IU daily  ?See me again in 5-6 weeks  ? ? ?

## 2022-02-28 NOTE — Assessment & Plan Note (Signed)
Si joint pain, no instability. Discussed HEP  ?Discussed which activities to do and which ones to avoid.  ?Pain out of proportion. Discussed labs rule out autoimmune ?Responded to OMT.  ?Labs pending  ?Tried OMT ?RTC in 6 weeks  ?

## 2022-03-02 LAB — PTH, INTACT AND CALCIUM
Calcium: 9.7 mg/dL (ref 8.6–10.2)
PTH: 21 pg/mL (ref 16–77)

## 2022-03-02 LAB — ANA: Anti Nuclear Antibody (ANA): POSITIVE — AB

## 2022-03-02 LAB — CYCLIC CITRUL PEPTIDE ANTIBODY, IGG: Cyclic Citrullin Peptide Ab: <16 UNITS

## 2022-03-02 LAB — CALCIUM, IONIZED: Calcium, Ion: 5.1 mg/dL (ref 4.8–5.6)

## 2022-03-02 LAB — ANTI-NUCLEAR AB-TITER (ANA TITER): ANA Titer 1: 1:40 {titer} — ABNORMAL HIGH

## 2022-03-02 LAB — ANGIOTENSIN CONVERTING ENZYME: Angiotensin-Converting Enzyme: 24 U/L (ref 9–67)

## 2022-03-02 LAB — RHEUMATOID FACTOR: Rheumatoid fact SerPl-aCnc: <14 IU/mL (ref <14)

## 2022-03-04 ENCOUNTER — Encounter: Payer: Self-pay | Admitting: Family Medicine

## 2022-03-04 ENCOUNTER — Ambulatory Visit: Payer: BC Managed Care – PPO | Admitting: Physical Therapy

## 2022-03-04 ENCOUNTER — Other Ambulatory Visit: Payer: Self-pay

## 2022-03-04 DIAGNOSIS — R293 Abnormal posture: Secondary | ICD-10-CM | POA: Diagnosis not present

## 2022-03-04 DIAGNOSIS — M6281 Muscle weakness (generalized): Secondary | ICD-10-CM | POA: Diagnosis not present

## 2022-03-04 DIAGNOSIS — R252 Cramp and spasm: Secondary | ICD-10-CM | POA: Diagnosis not present

## 2022-03-04 DIAGNOSIS — R278 Other lack of coordination: Secondary | ICD-10-CM | POA: Diagnosis not present

## 2022-03-04 DIAGNOSIS — M62838 Other muscle spasm: Secondary | ICD-10-CM | POA: Diagnosis not present

## 2022-03-04 NOTE — Therapy (Signed)
[facility] [place], [place], [address] Phone: [phone]   Fax:  [phone]  Physical Therapy Treatment  Patient Details  Name: Donna [name] MRN: [id number] Date of Birth: Feb 06, 1991 Referring Provider (PT): Radene Gunning, MD   Encounter Date: 03/04/2022   PT End of Session - 03/04/22 1402     Visit Number 8    Date for PT Re-Evaluation 04/06/22    Authorization Type BCBS    PT Start Time 1400    PT Stop Time 1445    PT Time Calculation (min) 45 min    Activity Tolerance Patient tolerated treatment well;Patient limited by pain    Behavior During Therapy [facility] for tasks assessed/performed             Past Medical History:  Diagnosis Date   Acid reflux    Anemia    History of palpitations     Past Surgical History:  Procedure Laterality Date   UPPER GASTROINTESTINAL ENDOSCOPY  2019    There were no vitals filed for this visit.   Subjective Assessment - 03/04/22 1448     Subjective Pt reports she has been having mild pain in hips and pelvis but not bad per pt and not consistant. Pt also reports she has been attempting mirror at home for feedback for pelvic floor contact/relaxation and pt reports she could see the difference but is not consistent yet.    Patient Stated Goals Pelvic sonogram is otherwise unremarkable.                            Pelvic Floor Special Questions - 03/04/22 0001     Pelvic Floor Internal Exam patient identified and patient confirms consent for PT to perform internal soft tissue work and muscle strength and integrity assessment    Exam Type Vaginal    Sensation WFL    Palpation no TTP    Strength weak squeeze, no lift   Pt demonstrated muscle activation but with downward mobility and no lift however this may be due to poor coordination instead of true weakness.   Strength # of reps 6    Strength # of seconds 2    Tone WFL                [facility] Adult PT Treatment/Exercise - 03/04/22 0001       Self-Care   Self-Care Other Self-Care Comments    Other Self-Care Comments  urge drill and visual feedback for pelvic contractions at home      Neuro Re-ed    Neuro Re-ed Details  PT directed in 2x10 pelvic floor contractions with internal vaginal treatment with quick release technique used to improve muscle recruitment. Pt continues to demonstrate improved ability to relax pelvic floor and intermittent contractions felt with max VC and TC and visual feedback provided with use of mirror for pt to better understand feeling of bulge vs contraction with pt improving with x10 contractios and 6 of them contracting without bulging                       PT Short Term Goals - 12/06/21 1713       PT SHORT TERM GOAL #1   Title pt to be I with HEP    Time 5    Period Weeks    Status New    Target Date 01/10/22  PT SHORT TERM GOAL #2   Title pt to report no more than 5/10 with mobility at pelvis to improve tolerance to exercising and community mobility    Time 5    Period Weeks    Status New    Target Date 01/10/22      PT SHORT TERM GOAL #3   Title pt to demonstrate ability to coordinate pelvic floor and breathing mechanics at least 50% of the time with activity to improve pelvic floor function.    Time 5    Period Weeks    Status New    Target Date 01/10/22      PT SHORT TERM GOAL #4   Title pt to report improved bowel regularity without need of straining to at least 3 BMs per week.    Time 5    Period Weeks    Status New    Target Date 01/10/22      PT SHORT TERM GOAL #5   Title pt to demonstrate at least 3/5 pelvic floor strength and ability to isometrically hold contractions 5s for improved pelvic floor function    Time 5    Period Weeks    Status New    Target Date 01/10/22               PT Long Term Goals - 12/06/21 1715       PT LONG TERM GOAL #1   Title pt to be I withadvanced  HEP     Time 4    Status New    Target Date 04/06/22      PT LONG TERM GOAL #2   Title pt to report no more than 2/10 with mobility at pelvis to improve tolerance to exercising and community mobility    Time 4    Period Months    Status New    Target Date 04/06/22      PT LONG TERM GOAL #3   Title pt to demonstrate ability to coordinate pelvic floor and breathing mechanics at least 75% of the time with activity to improve pelvic floor function.    Time 4    Period Months    Status New    Target Date 04/06/22      PT LONG TERM GOAL #4   Title pt to demonstrate at least 4/5 pelvic floor strength and ability to isometrically hold contractions 8s for improved pelvic floor function    Time 4    Status New    Target Date 04/06/22      PT LONG TERM GOAL #5   Title pt to demonstrate ability tos squat 20# from ground level with proper mechanics with no more than 2/10 pain for improved functional mobility    Time 4    Period Months    Status New    Target Date 04/06/22                   Plan - 03/04/22 1449     Clinical Impression Statement Pt session focused on internal pelvic floor coordination with PT providing internal feedback initially with intermittent use of quick release technique, mod-max VC for decreased bulging downward instead of up and in technique. PT then provided pt a hand mirrow for her to see visual feedback with PT providingVC for technique for tissue contracting and relaxing and pt then able to feel difference and accurately feel when she was bulging instead of correct technique with contraction. Pt educated on urge drill and  continuing this feedback technique at home. Pt agreeable and denied additional questions. Pt tolerated well and continues to demonstrate need of PT for further improvement with pelvic floor mobility.    Personal Factors and Comorbidities Time since onset of injury/illness/exacerbation    Examination-Activity Limitations Lift;Squat;Locomotion  Level    Examination-Participation Restrictions Interpersonal Relationship;Community Activity;Shop;Occupation    Stability/Clinical Decision Making Evolving/Moderate complexity    Rehab Potential Good    PT Frequency 1x / week    PT Duration Other (comment)   10 weeks   PT Treatment/Interventions ADLs/Self Care Home Management;Aquatic Therapy;Functional mobility training;Therapeutic activities;Therapeutic exercise;Neuromuscular re-education;Manual techniques;Patient/family education;Taping;Scar mobilization;Passive range of motion;Energy conservation    PT Next Visit Plan stretching at hips, breathing mechanics, manual at hips and abdomen    PT Home Exercise Plan [id number]    Consulted and Agree with Plan of Care Patient             Patient will benefit from skilled therapeutic intervention in order to improve the following deficits and impairments:  Decreased coordination, Decreased endurance, Pain, Impaired flexibility, Improper body mechanics, Postural dysfunction, Decreased strength, Decreased mobility, Impaired tone, Increased fascial restricitons  Visit Diagnosis: Muscle weakness (generalized)  Abnormal posture     Problem List Patient Active Problem List   Diagnosis Date Noted   SI (sacroiliac) joint dysfunction 02/28/2022   Somatic dysfunction of spine, sacral 02/28/2022   Schizencephaly ([place]) 08/29/2021   Thoracogenic scoliosis of thoracolumbar region 08/29/2021   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 Patient is motivated to learn. Patient understands and agrees with treatment goals and plan. PT explains patient will be examined in standing, sitting, and lying down to see how their muscles and joints work.  When they are ready, they will be asked to remove their underwear so PT can examine their perineum. The patient is also given the option of providing their own chaperone as one is not provided in our facility. The patient also has the right and is explained the right to defer or refuse any part of the evaluation or treatment including the internal exam. With the patient's consent, PT will use one gloved finger to gently assess the muscles of the pelvic floor, seeing how well it contracts and relaxes and if there is muscle symmetry. After, the patient will get dressed and PT and patient will discuss exam findings and plan of care. PT and patient discuss plan of care, schedule, attendance policy and HEP activities.   Stacy Gardner, PT, DPT 03/06/232:52 PM   [facility] [place], [place], [address] Phone: [phone]   Fax:  [phone]  Name: Donna [name] MRN: [id number] Date of Birth: [birth year]

## 2022-03-04 NOTE — Patient Instructions (Signed)

## 2022-03-07 DIAGNOSIS — N840 Polyp of corpus uteri: Secondary | ICD-10-CM

## 2022-03-07 NOTE — H&P (Signed)
[facility]st[facility]s and Gynecology Attending History and Physical ? ?Donna [name] is a 31 y.o. G0P0000 who is for surgery today due to symptomatic polyps who plans to TTC this year. She has had ongoing mid cycle spotting. She had an SIS which showed 4 polyps. She had a normal pap and cultures have been negative.  ? ?She also has lower pelvic floor pain/dysfunction and is doing PFPT for this.  ? ?Past Medical History:  ?Diagnosis Date  ? Acid reflux   ? Anemia   ? History of palpitations   ? ?Past Surgical History:  ?Procedure Laterality Date  ? UPPER GASTROINTESTINAL ENDOSCOPY  2019  ? ?OB History  ?Gravida Para Term Preterm AB Living  ?0 0 0 0 0 0  ?SAB IAB Ectopic Multiple Live Births  ?0 0 0 0 0  ?Patient denies any other pertinent gynecologic issues.  ?No current facility-administered medications on file prior to encounter.  ? ?Current Outpatient Medications on File Prior to Encounter  ?Medication Sig Dispense Refill  ? ferrous sulfate 325 (65 FE) MG tablet Take 325 mg by mouth daily with breakfast.    ? Prenatal Vit-Fe Fumarate-FA (MULTIVITAMIN-PRENATAL) 27-0.8 MG TABS tablet Take 1 tablet by mouth daily at 12 noon.    ? ?Allergies  ?Allergen Reactions  ? Penicillin G Rash  ? Sulfamethoxazole Rash  ? ? ?Social History:   reports that she has never smoked. She has never used smokeless tobacco. She reports current alcohol use. She reports that she does not use drugs. ?Family History  ?Problem Relation Age of Onset  ? Cancer Maternal Grandfather   ?     Prostate  ? Diabetes Brother   ?     type 1  ? Hypertension Neg Hx   ? ? ?Review of Systems: Pertinent items noted in HPI and remainder of comprehensive ROS otherwise negative. ? ?PHYSICAL EXAM: ?There were no vitals taken for this visit. - Vitals are pending at the time of this H&P ?CONSTITUTIONAL: Well-developed, well-nourished female in no acute distress.  ?HENT:  Normocephalic, atraumatic, External right and left ear normal. Oropharynx is clear and  moist ?EYES: Conjunctivae and EOM are normal. Pupils are equal, round, and reactive to light. No scleral icterus.  ?NECK: Normal range of motion, supple, no masses ?SKIN: Skin is warm and dry. No rash noted. Not diaphoretic. No erythema. No pallor. ?NEUROLOGIC: Alert and oriented to person, place, and time. Normal reflexes, muscle tone coordination. No cranial nerve deficit noted. ?PSYCHIATRIC: Normal mood and affect. Normal behavior. Normal judgment and thought content. ?CARDIOVASCULAR: Normal heart rate noted, regular rhythm ?RESPIRATORY: Effort and breath sounds normal, no problems with respiration noted ?ABDOMEN: Soft, nontender, nondistended. ?PELVIC: Normal appearing external genitalia; normal appearing vaginal mucosa and cervix.  Normal appearing discharge.  Normal uterine size, no other palpable masses, no uterine or adnexal tenderness ?MUSCULOSKELETAL: Normal range of motion. No tenderness.  No cyanosis, clubbing, or edema.  2+ distal pulses. ? ?Labs: ?Results for orders placed or performed in visit on 02/28/22 (from the past 336 hour(s))  ?VITAMIN D 25 Hydroxy (Vit-D Deficiency, Fractures)  ? Collection Time: 02/28/22 12:31 PM  ?Result Value Ref Range  ? VITD 25.21 (L) 30.00 - 100.00 ng/mL  ?Uric acid  ? Collection Time: 02/28/22 12:31 PM  ?Result Value Ref Range  ? Uric Acid, Serum 4.1 2.4 - 7.0 mg/dL  ?TSH  ? Collection Time: 02/28/22 12:31 PM  ?Result Value Ref Range  ? TSH 2.27 0.35 - 5.50 uIU/mL  ?Sedimentation  rate  ? Collection Time: 02/28/22 12:31 PM  ?Result Value Ref Range  ? Sed Rate 6 0 - 20 mm/hr  ?Rheumatoid factor  ? Collection Time: 02/28/22 12:31 PM  ?Result Value Ref Range  ? Rhuematoid fact SerPl-aCnc <14 <14 IU/mL  ?PTH, intact and calcium  ? Collection Time: 02/28/22 12:31 PM  ?Result Value Ref Range  ? PTH 21 16 - 77 pg/mL  ? Calcium 9.7 8.6 - 10.2 mg/dL  ?IBC panel  ? Collection Time: 02/28/22 12:31 PM  ?Result Value Ref Range  ? Iron 102 42 - 145 ug/dL  ? Transferrin 248.0 212.0 -  360.0 mg/dL  ? Saturation Ratios 29.4 20.0 - 50.0 %  ? TIBC 347.2 250.0 - 450.0 mcg/dL  ?Ferritin  ? Collection Time: 02/28/22 12:31 PM  ?Result Value Ref Range  ? Ferritin 6.6 (L) 10.0 - 291.0 ng/mL  ?Cyclic citrul peptide antibody, IgG  ? Collection Time: 02/28/22 12:31 PM  ?Result Value Ref Range  ? Cyclic Citrullin Peptide Ab <16<16 UNITS  ?Angiotensin converting enzyme  ? Collection Time: 02/28/22 12:31 PM  ?Result Value Ref Range  ? Angiotensin-Converting Enzyme 24 9 - 67 U/L  ?C-reactive protein  ? Collection Time: 02/28/22 12:31 PM  ?Result Value Ref Range  ? CRP <1.0 0.5 - 20.0 mg/dL  ?Comprehensive metabolic panel  ? Collection Time: 02/28/22 12:31 PM  ?Result Value Ref Range  ? Sodium 137 135 - 145 mEq/L  ? Potassium 4.2 3.5 - 5.1 mEq/L  ? Chloride 104 96 - 112 mEq/L  ? CO2 26 19 - 32 mEq/L  ? Glucose, Bld 90 70 - 99 mg/dL  ? BUN 9 6 - 23 mg/dL  ? Creatinine, Ser 0.74 0.40 - 1.20 mg/dL  ? Total Bilirubin 0.9 0.2 - 1.2 mg/dL  ? Alkaline Phosphatase 40 39 - 117 U/L  ? AST 14 0 - 37 U/L  ? ALT 8 0 - 35 U/L  ? Total Protein 7.1 6.0 - 8.3 g/dL  ? Albumin 4.5 3.5 - 5.2 g/dL  ? GFR 109.24 >60.00 mL/min  ? Calcium 9.4 8.4 - 10.5 mg/dL  ?CBC with Differential/Platelet  ? Collection Time: 02/28/22 12:31 PM  ?Result Value Ref Range  ? WBC 4.6 4.0 - 10.5 K/uL  ? RBC 4.22 3.87 - 5.11 Mil/uL  ? Hemoglobin 13.1 12.0 - 15.0 g/dL  ? HCT 39.2 36.0 - 46.0 %  ? MCV 92.9 78.0 - 100.0 fl  ? MCHC 33.3 30.0 - 36.0 g/dL  ? RDW 13.3 11.5 - 15.5 %  ? Platelets 258.0 150.0 - 400.0 K/uL  ? Neutrophils Relative % 59.4 43.0 - 77.0 %  ? Lymphocytes Relative 31.7 12.0 - 46.0 %  ? Monocytes Relative 7.5 3.0 - 12.0 %  ? Eosinophils Relative 0.6 0.0 - 5.0 %  ? Basophils Relative 0.8 0.0 - 3.0 %  ? Neutro Abs 2.7 1.4 - 7.7 K/uL  ? Lymphs Abs 1.5 0.7 - 4.0 K/uL  ? Monocytes Absolute 0.3 0.1 - 1.0 K/uL  ? Eosinophils Absolute 0.0 0.0 - 0.7 K/uL  ? Basophils Absolute 0.0 0.0 - 0.1 K/uL  ?Calcium, ionized  ? Collection Time: 02/28/22 12:31 PM   ?Result Value Ref Range  ? Calcium, Ion 5.1 4.8 - 5.6 mg/dL  ?ANA  ? Collection Time: 02/28/22 12:31 PM  ?Result Value Ref Range  ? Anti Nuclear Antibody (ANA) POSITIVE (A) NEGATIVE  ?Anti-nuclear ab-titer (ANA titer)  ? Collection Time: 02/28/22 12:31 PM  ?Result Value Ref Range  ?  ANA Titer 1 1:40 (H) titer  ? ANA Pattern 1 Nuclear, Speckled (A)   ? ? ?Imaging Studies: ?[place] Sonohysterogram ? ?Result Date: 02/18/2022 ?CLINICAL DATA:  Post coital bleeding. EXAM: [place] SONOHYSTEROGRAM TECHNIQUE: Following cleansing of the cervix and vagina with Betadine, a hysterosalpingogram catheter was placed within the endocervical canal. Sonohysterogram was then performed with transvaginal sonography during infusion of sterile saline solution into the endometrial cavity. COMPARISON:  None. FINDINGS: With reflux of saline into the uterus, the endometrial stripe is normal. However, multiple small polyps are present. Along the posterior wall the uterus, adjacent small polyps are 1.2 x 0.6 and 0.5 x 0.4 centimeters. In the anterior fundal uterus, small polyp is 0.6 x 0.4 centimeters. A small anterior broad-based polyp is 0.8 x 0.5 centimeters. No fibroids are identified. IMPRESSION: Small uterine polyps, largest measuring 1.2 centimeters. Electronically Signed   By: Norva Pavlov M.D.   On: 02/18/2022 16:23   ? ?Assessment: ?Active Problems: ?  Uterine polyp ? ? ?Plan: ?Reviewed findings of 4 polyps. Discussed this is clear cause of her bleeding. Recommend removal and ideally before she TTC. She is agreeable to this plan. Surgery would be D&C, hysteroscopy and polypectomy. Would avoid curettage overall as she plans to have children. Reviewed myosure to minimize trauma to uterus.  ? ? ?Milas Hock, MD, FACOG ?Obstetrician Heritage manager, Faculty Practice ?Center for [employer], [facility] ? ?

## 2022-03-12 ENCOUNTER — Other Ambulatory Visit: Payer: Self-pay

## 2022-03-12 ENCOUNTER — Encounter: Payer: Self-pay | Admitting: Obstetrics and Gynecology

## 2022-03-12 ENCOUNTER — Encounter (HOSPITAL_BASED_OUTPATIENT_CLINIC_OR_DEPARTMENT_OTHER): Payer: Self-pay | Admitting: Obstetrics and Gynecology

## 2022-03-12 NOTE — Progress Notes (Signed)
Spoke w/ via phone for pre-op interview--- Donna [name] ?Lab needs dos---- T&S UPT              ?Lab results------ ?COVID test -----patient states asymptomatic no test needed ?Arrive at -------1000 ?NPO after MN NO Solid Food.   ?Med rec completed ?Medications to take morning of surgery -----NONE ?Diabetic medication ----- ?Patient instructed no nail polish to be worn day of surgery ?Patient instructed to bring photo id and insurance card day of surgery ?Patient aware to have Driver (ride ) / caregiver  (Husband) Donna [name]  for 24 hours after surgery  ?Patient Special Instructions ----- ?Pre-Op special Istructions ----- ?Patient verbalized understanding of instructions that were given at this phone interview. ?Patient denies shortness of breath, chest pain, fever, cough at this phone interview.  ?

## 2022-03-13 ENCOUNTER — Encounter: Payer: Self-pay | Admitting: Physical Therapy

## 2022-03-13 ENCOUNTER — Ambulatory Visit: Payer: BC Managed Care – PPO | Admitting: Physical Therapy

## 2022-03-13 DIAGNOSIS — M62838 Other muscle spasm: Secondary | ICD-10-CM | POA: Diagnosis not present

## 2022-03-13 DIAGNOSIS — R293 Abnormal posture: Secondary | ICD-10-CM | POA: Diagnosis not present

## 2022-03-13 DIAGNOSIS — R278 Other lack of coordination: Secondary | ICD-10-CM

## 2022-03-13 DIAGNOSIS — M6281 Muscle weakness (generalized): Secondary | ICD-10-CM | POA: Diagnosis not present

## 2022-03-13 DIAGNOSIS — R252 Cramp and spasm: Secondary | ICD-10-CM | POA: Diagnosis not present

## 2022-03-13 NOTE — Therapy (Signed)
[place] ?[facility] ?[address] ?[place], [place], [address] ?Phone: [phone]   Fax:  [phone] ? ?Physical Therapy Treatment ? ?Patient Details  ?Name: Donna PublicGloria [name] ?MRN: [id number] ?Date of Birth: August 18, 1991 ?Referring Provider (PT): Milas Hockuncan, Paula, MD ? ? ?Encounter Date: 03/13/2022 ? ? PT End of Session - 03/13/22 [id number]   ? ? Visit Number 9   ? Date for PT Re-Evaluation 04/06/22   ? Authorization Type BCBS   ? PT Start Time 0930   ? PT Stop Time 1014   ? PT Time Calculation (min) 44 min   ? Activity Tolerance Patient tolerated treatment well;Patient limited by pain   ? Behavior [facility] for tasks assessed/performed   ? ?  ?  ? ?  ? ? ?Past Medical History:  ?Diagnosis Date  ? Acid reflux   ? Anemia   ? History of palpitations   ? ? ?Past Surgical History:  ?Procedure Laterality Date  ? UPPER GASTROINTESTINAL ENDOSCOPY  2019  ? ? ?There were no vitals filed for this visit. ? ? Subjective Assessment - 03/13/22 1001   ? ? Subjective Pt reports she has been doing HEP a few times per week, has been doing workouts 5-6x per week as tolerated with resistance bands, but reports she thinks some of stress is coming from "past traumas" and thinks counseling would help and PTrecommending seeking this if she would benefit from it to help with stress management if needed. Pt also reports she doesn't drink much water and trying to increase this.   ? Patient Stated Goals Pelvic sonogram is otherwise unremarkable.   ? Currently in Pain? No/denies   ? ?  ?  ? ?  ? ? ? ? ? ? ? ? ? ? ? ? ? ? ? ? ? ? ? ? [facility] Adult PT Treatment/Exercise - 03/13/22 0001   ? ?  ? Self-Care  ? Self-Care Other Self-Care Comments   ? Other Self-Care Comments  Pt educated on lifestyle modifications with increased water intake, breathing mechanics, voiding mechanics, pelvic/global relaxation techniques.   ?  ? Exercises  ? Exercises Lumbar;Knee/Hip   ?  ? Lumbar Exercises: Stretches  ? Passive  Hamstring Stretch --   ? Hip Flexor Stretch Right;Left;2 reps;30 seconds   ? Pelvic Tilt --   ? Pelvic Tilt Limitations --   ? Other Lumbar Stretch Exercise deep squat 2x45s; pigeon bow with hip IR 2x45s   ? Other Lumbar Stretch Exercise v sit 2x30s; cat/cow with emphasis at pelvis x10; modified pigeon pose in standing 2x30s;   ? ?  ?  ? ?  ? ? ? ? ? ? ? ? ? ? ? ? PT Short Term Goals - 03/13/22 0936   ? ?  ? PT SHORT TERM GOAL #1  ? Title pt to be I with HEP   ? Time 5   ? Period Weeks   ? Status Achieved   ? Target Date 01/10/22   ?  ? PT SHORT TERM GOAL #2  ? Title pt to report no more than 5/10 with mobility at pelvis to improve tolerance to exercising and community mobility   ? Time 5   ? Period Weeks   ? Status Achieved   ? Target Date 01/10/22   ?  ? PT SHORT TERM GOAL #3  ? Title pt to demonstrate ability to coordinate pelvic floor and breathing mechanics at least 50% of the time with  activity to improve pelvic floor function.   ? Time 5   ? Period Weeks   ? Status On-going   ? Target Date 01/10/22   ?  ? PT SHORT TERM GOAL #4  ? Title pt to report improved bowel regularity without need of straining to at least 3 BMs per week.   ? Time 5   ? Period Weeks   ? Status Achieved   ? Target Date 01/10/22   ?  ? PT SHORT TERM GOAL #5  ? Title pt to demonstrate at least 3/5 pelvic floor strength and ability to isometrically hold contractions 5s for improved pelvic floor function   ? Time 5   ? Period Weeks   ? Status On-going   ? Target Date 01/10/22   ? ?  ?  ? ?  ? ? ? ? PT Long Term Goals - 03/13/22 0936   ? ?  ? PT LONG TERM GOAL #1  ? Title pt to be I withadvanced  HEP   ? Time 4   ? Status On-going   ? Target Date 04/06/22   ?  ? PT LONG TERM GOAL #2  ? Title pt to report no more than 2/10 with mobility at pelvis to improve tolerance to exercising and community mobility   ? Time 4   ? Period Months   ? Status On-going   ? Target Date 04/06/22   ?  ? PT LONG TERM GOAL #3  ? Title pt to demonstrate ability to  coordinate pelvic floor and breathing mechanics at least 75% of the time with activity to improve pelvic floor function.   ? Time 4   ? Period Months   ? Status On-going   ? Target Date 04/06/22   ?  ? PT LONG TERM GOAL #4  ? Title pt to demonstrate at least 4/5 pelvic floor strength and ability to isometrically hold contractions 8s for improved pelvic floor function   ? Time 4   ? Status On-going   ? Target Date 04/06/22   ?  ? PT LONG TERM GOAL #5  ? Title pt to demonstrate ability tos squat 20# from ground level with proper mechanics with no more than 2/10 pain for improved functional mobility   ? Time 4   ? Period Months   ? Status On-going   ? Target Date 04/06/22   ? ?  ?  ? ?  ? ? ? ? ? ? ? ? Plan - 03/13/22 1006   ? ? Clinical Impression Statement Pt presents to clinic reporting she has had less pain in general and when she does it's not usually more than 5/10 however may have a spark sudden instance of 7/10 pain but quickly goes away. Pt got a squatty potty and is attempting to use this at home, demonstrated increased tone with squeezing knees shut with simulated sitting at toilet with squatty potty used, PT educated to increased width of seperation at knees, rest elbows on knees, and take deeper breathes to relax abdomen and pelvic floor. pt reports she will attempt this. Session focused on educated on lifestyle modifications (fluid intake, breathing mecahnics, increased mobility, voiding mechanics) and stretching at hips and spine for relaxation at spine, abdomen, and hips. Pt tolerated well with minimal cues, continues to demonstrate need of PT for further improvement with pelvic floor mobility.   ? Personal Factors and Comorbidities Time since onset of injury/illness/exacerbation   ? Examination-Activity Limitations Lift;Squat;Locomotion Level   ?  Examination-Participation Restrictions Interpersonal Relationship;Community Activity;Shop;Occupation   ? Stability/Clinical Decision Making Evolving/Moderate  complexity   ? Rehab Potential Good   ? PT Frequency 1x / week   ? PT Duration Other (comment)   10 weeks  ? PT Treatment/Interventions ADLs/Self Care Home Management;Aquatic Therapy;Functional mobility training;Therapeutic activities;Therapeutic exercise;Neuromuscular re-education;Manual techniques;Patient/family education;Taping;Scar mobilization;Passive range of motion;Energy conservation   ? PT Next Visit Plan stretching at hips, breathing mechanics, manual at hips and abdomen   ? PT Home Exercise Plan [id number]   ? Consulted and Agree with Plan of Care Patient   ? ?  ?  ? ?  ? ? ?Patient will benefit from skilled therapeutic intervention in order to improve the following deficits and impairments:  Decreased coordination, Decreased endurance, Pain, Impaired flexibility, Improper body mechanics, Postural dysfunction, Decreased strength, Decreased mobility, Impaired tone, Increased fascial restricitons ? ?Visit Diagnosis: ?Muscle weakness (generalized) ? ?Abnormal posture ? ?Cramp and spasm ? ?Other lack of coordination ? ? ? ? ?Problem List ?Patient Active Problem List  ? Diagnosis Date Noted  ? Uterine polyp 03/07/2022  ? 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 Somatic dysfunction of spine, sacral 02/28/2022  ? 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 Abnormal CT of brain 12/15/2018  ? Migraine without aura and without status migrainosus, not intractable 12/15/2018  ? Anxiety 05/22/2018  ? Palpitations 05/18/2018  ? Globus sensation 01/15/2017  ? ? ?Otelia Sergeant, PT, DPT ?03/13/2309:15 AM  ? ?[place] ?[facility] ?[address] ?[place], [place], [address] ?Phone: [phone]   Fax:  [phone] ? ?Name: Myrical Andujo ?MRN: [id number] ?Date of Birth: [birth year] ? ? ? ?

## 2022-03-18 ENCOUNTER — Encounter: Payer: BC Managed Care – PPO | Admitting: Physical Therapy

## 2022-03-19 ENCOUNTER — Encounter (HOSPITAL_BASED_OUTPATIENT_CLINIC_OR_DEPARTMENT_OTHER)
Admission: RE | Disposition: A | Discharge status: Home / Self Care | Payer: Self-pay | Source: Home / Self Care | Attending: Obstetrics and Gynecology

## 2022-03-19 ENCOUNTER — Encounter (HOSPITAL_BASED_OUTPATIENT_CLINIC_OR_DEPARTMENT_OTHER): Payer: Self-pay | Admitting: Obstetrics and Gynecology

## 2022-03-19 ENCOUNTER — Ambulatory Visit (HOSPITAL_BASED_OUTPATIENT_CLINIC_OR_DEPARTMENT_OTHER): Payer: BC Managed Care – PPO | Admitting: Anesthesiology

## 2022-03-19 ENCOUNTER — Ambulatory Visit (HOSPITAL_BASED_OUTPATIENT_CLINIC_OR_DEPARTMENT_OTHER)
Admission: RE | Admit: 2022-03-19 | Discharge: 2022-03-19 | Disposition: A | Discharge status: Home / Self Care | Payer: BC Managed Care – PPO | Attending: Obstetrics and Gynecology | Admitting: Obstetrics and Gynecology

## 2022-03-19 ENCOUNTER — Other Ambulatory Visit: Payer: Self-pay

## 2022-03-19 DIAGNOSIS — N840 Polyp of corpus uteri: Secondary | ICD-10-CM

## 2022-03-19 DIAGNOSIS — K219 Gastro-esophageal reflux disease without esophagitis: Secondary | ICD-10-CM | POA: Insufficient documentation

## 2022-03-19 DIAGNOSIS — R102 Pelvic and perineal pain: Secondary | ICD-10-CM | POA: Insufficient documentation

## 2022-03-19 DIAGNOSIS — N920 Excessive and frequent menstruation with regular cycle: Secondary | ICD-10-CM

## 2022-03-19 DIAGNOSIS — N930 Postcoital and contact bleeding: Secondary | ICD-10-CM | POA: Insufficient documentation

## 2022-03-19 DIAGNOSIS — N923 Ovulation bleeding: Secondary | ICD-10-CM | POA: Diagnosis not present

## 2022-03-19 DIAGNOSIS — F419 Anxiety disorder, unspecified: Secondary | ICD-10-CM | POA: Diagnosis not present

## 2022-03-19 HISTORY — PX: DILATATION & CURETTAGE/HYSTEROSCOPY WITH MYOSURE: SHX6511

## 2022-03-19 LAB — TYPE AND SCREEN
ABO/RH(D): B POS
Antibody Screen: NEGATIVE

## 2022-03-19 LAB — ABO/RH: ABO/RH(D): B POS

## 2022-03-19 LAB — POCT PREGNANCY, URINE: Preg Test, Ur: NEGATIVE

## 2022-03-19 SURGERY — DILATATION & CURETTAGE/HYSTEROSCOPY WITH MYOSURE
Anesthesia: General | Site: Uterus

## 2022-03-19 MED ORDER — AMISULPRIDE (ANTIEMETIC) 5 MG/2ML IV SOLN: 10.0000 mg | Freq: Once | INTRAVENOUS | Status: DC | PRN

## 2022-03-19 MED ORDER — ONDANSETRON HCL 4 MG/2ML IJ SOLN
INTRAMUSCULAR | Status: DC | PRN
  Administered 2022-03-19: 4 mg via INTRAVENOUS

## 2022-03-19 MED ORDER — HYDROMORPHONE HCL 1 MG/ML IJ SOLN: 0.2500 mg | INTRAMUSCULAR | Status: DC | PRN

## 2022-03-19 MED ORDER — ONDANSETRON HCL 4 MG/2ML IJ SOLN: 4.0000 mg | Freq: Once | INTRAMUSCULAR | Status: DC | PRN

## 2022-03-19 MED ORDER — LIDOCAINE-EPINEPHRINE 1 %-1:100000 IJ SOLN
INTRAMUSCULAR | Status: AC
  Filled 2022-03-19: qty 1

## 2022-03-19 MED ORDER — LIDOCAINE HCL (CARDIAC) PF 100 MG/5ML IV SOSY
PREFILLED_SYRINGE | INTRAVENOUS | Status: DC | PRN
  Administered 2022-03-19: 40 mg via INTRAVENOUS

## 2022-03-19 MED ORDER — FENTANYL CITRATE (PF) 100 MCG/2ML IJ SOLN
INTRAMUSCULAR | Status: AC
  Filled 2022-03-19: qty 2

## 2022-03-19 MED ORDER — LACTATED RINGERS IV SOLN: INTRAVENOUS | Status: DC

## 2022-03-19 MED ORDER — IBUPROFEN 800 MG PO TABS: 800.0000 mg | ORAL_TABLET | Freq: Three times a day (TID) | ORAL | 1 refills | Status: DC | PRN

## 2022-03-19 MED ORDER — MIDAZOLAM HCL 2 MG/2ML IJ SOLN
INTRAMUSCULAR | Status: DC | PRN
  Administered 2022-03-19: 2 mg via INTRAVENOUS

## 2022-03-19 MED ORDER — SCOPOLAMINE 1 MG/3DAYS TD PT72
1.0000 | MEDICATED_PATCH | TRANSDERMAL | Status: DC
  Administered 2022-03-19: 1.5 mg via TRANSDERMAL

## 2022-03-19 MED ORDER — OXYCODONE HCL 5 MG PO TABS: 5.0000 mg | ORAL_TABLET | Freq: Once | ORAL | Status: DC | PRN

## 2022-03-19 MED ORDER — GLYCOPYRROLATE PF 0.2 MG/ML IJ SOSY
PREFILLED_SYRINGE | INTRAMUSCULAR | Status: AC
  Filled 2022-03-19: qty 4

## 2022-03-19 MED ORDER — POVIDONE-IODINE 10 % EX SWAB: 2.0000 "application " | Freq: Once | CUTANEOUS | Status: DC

## 2022-03-19 MED ORDER — DEXAMETHASONE SODIUM PHOSPHATE 4 MG/ML IJ SOLN
INTRAMUSCULAR | Status: DC | PRN
  Administered 2022-03-19: 8 mg via INTRAVENOUS

## 2022-03-19 MED ORDER — OXYCODONE HCL 5 MG/5ML PO SOLN: 5.0000 mg | Freq: Once | ORAL | Status: DC | PRN

## 2022-03-19 MED ORDER — KETOROLAC TROMETHAMINE 30 MG/ML IJ SOLN
INTRAMUSCULAR | Status: DC | PRN
  Administered 2022-03-19: 30 mg via INTRAVENOUS

## 2022-03-19 MED ORDER — SILVER NITRATE-POT NITRATE 75-25 % EX MISC
CUTANEOUS | Status: AC
  Filled 2022-03-19: qty 10

## 2022-03-19 MED ORDER — SCOPOLAMINE 1 MG/3DAYS TD PT72
MEDICATED_PATCH | TRANSDERMAL | Status: AC
  Filled 2022-03-19: qty 1

## 2022-03-19 MED ORDER — KETOROLAC TROMETHAMINE 30 MG/ML IJ SOLN: 30.0000 mg | Freq: Once | INTRAMUSCULAR | Status: DC | PRN

## 2022-03-19 MED ORDER — PROPOFOL 10 MG/ML IV BOLUS
INTRAVENOUS | Status: DC | PRN
  Administered 2022-03-19: 150 mg via INTRAVENOUS
  Administered 2022-03-19: 50 mg via INTRAVENOUS

## 2022-03-19 MED ORDER — MIDAZOLAM HCL 2 MG/2ML IJ SOLN
INTRAMUSCULAR | Status: AC
  Filled 2022-03-19: qty 2

## 2022-03-19 MED ORDER — MEPERIDINE HCL 25 MG/ML IJ SOLN: 6.2500 mg | INTRAMUSCULAR | Status: DC | PRN

## 2022-03-19 MED ORDER — SODIUM CHLORIDE 0.9 % IR SOLN
Status: DC | PRN
  Administered 2022-03-19: 2818 mL

## 2022-03-19 MED ORDER — ACETAMINOPHEN 500 MG PO TABS
ORAL_TABLET | ORAL | Status: AC
  Filled 2022-03-19: qty 2

## 2022-03-19 MED ORDER — ACETAMINOPHEN 500 MG PO TABS
1000.0000 mg | ORAL_TABLET | Freq: Once | ORAL | Status: AC
  Administered 2022-03-19: 1000 mg via ORAL

## 2022-03-19 MED ORDER — FENTANYL CITRATE (PF) 100 MCG/2ML IJ SOLN
INTRAMUSCULAR | Status: DC | PRN
  Administered 2022-03-19 (×2): 50 ug via INTRAVENOUS

## 2022-03-19 SURGICAL SUPPLY — 14 items
CATH ROBINSON RED A/P 16FR (CATHETERS) ×1 IMPLANT
DEVICE MYOSURE LITE (MISCELLANEOUS) IMPLANT
DEVICE MYOSURE REACH (MISCELLANEOUS) ×1 IMPLANT
DILATOR CANAL MILEX (MISCELLANEOUS) ×1 IMPLANT
ELECT REM PT RETURN 9FT ADLT (ELECTROSURGICAL) ×2
ELECTRODE REM PT RTRN 9FT ADLT (ELECTROSURGICAL) IMPLANT
GLOVE SURG ENC MOIS LTX SZ6 (GLOVE) ×2 IMPLANT
GOWN STRL REUS W/ TWL LRG LVL3 (GOWN DISPOSABLE) ×2 IMPLANT
GOWN STRL REUS W/TWL LRG LVL3 (GOWN DISPOSABLE) ×4
KIT PROCEDURE FLUENT (KITS) ×2 IMPLANT
PACK VAGINAL MINOR WOMEN LF (CUSTOM PROCEDURE TRAY) ×2 IMPLANT
PAD OB MATERNITY 4.3X12.25 (PERSONAL CARE ITEMS) ×2 IMPLANT
SEAL ROD LENS SCOPE MYOSURE (ABLATOR) ×2 IMPLANT
TOWEL OR 17X26 10 PK STRL BLUE (TOWEL DISPOSABLE) ×3 IMPLANT

## 2022-03-19 NOTE — Anesthesia Postprocedure Evaluation (Signed)
Anesthesia Post Note ? ?Patient: Donna [name] ? ?Procedure(s) Performed: DILATATION & CURETTAGE/HYSTEROSCOPY WITH MYOSURE (Uterus) ? ?  ? ?Patient location during evaluation: PACU ?Anesthesia Type: General ?Level of consciousness: awake and alert, oriented and patient cooperative ?Pain management: pain level controlled ?Vital Signs Assessment: post-procedure vital signs reviewed and stable ?Respiratory status: spontaneous breathing, nonlabored ventilation and respiratory function stable ?Cardiovascular status: blood pressure returned to baseline and stable ?Postop Assessment: no apparent nausea or vomiting ?Anesthetic complications: no ? ? ?No notable events documented. ? ?Last Vitals:  ?Vitals:  ? 03/19/22 1315 03/19/22 1345  ?BP: 102/66 105/63  ?Pulse: 63 65  ?Resp: 17 20  ?Temp:    ?SpO2: 100% 100%  ?  ?Last Pain:  ?Vitals:  ? 03/19/22 1345  ?TempSrc:   ?PainSc: 0-No pain  ? ? ?  ?  ?  ?  ?  ?  ? ?Tennis Must Madelena Maturin ? ? ? ? ?

## 2022-03-19 NOTE — Interval H&P Note (Signed)
History and Physical Interval Note: ? ?03/19/2022 ?11:55 AM ? ?Donna [name]  has presented today for surgery, with the diagnosis of Postcoital bleeding ?Pelvic Pain ?Polyp.  The various methods of treatment have been discussed with the patient and family. After consideration of risks, benefits and other options for treatment, the patient has consented to  Procedure(s): ?DILATATION & CURETTAGE/HYSTEROSCOPY WITH MYOSURE (N/A) as a surgical intervention.  The patient's history has been reviewed, patient examined, no change in status, stable for surgery.  I have reviewed the patient's chart and labs.  Questions were answered to the patient's satisfaction.   ? ? ?Donna [name] ? ? ?

## 2022-03-19 NOTE — Op Note (Addendum)
03/19/22 ?12:46 PM ? ?Preop: Intermenstrual spotting, endometrial polyps ?Postop: Same ?Procedure: Dilation, hysteroscopy, resection of uterine polyps with myosure device ?Surgeon: Dr. Para March ?Assist: None ?EBL 5 cc ?IVF: 500 cc  ?UOP: 150 cc ?Specimens: uterine polyps ?Findings: NEFG, normal appearing cervix, uterus in the anteverted position. Ostia visualized bilaterally by the end of the procedure. Multiple uterine polyps located diffusely in the uterus.  ? ?Description of the procedure: ?Preop antibiotics not indicated. Informed consent reviewed and signed. Pt given opportunity to ask questions.  ? ?She was taken to the operating room. Pt prepped and draped in the dorsal lithotomy fashion after LMA anesthesia found to be adequate. Adequate timeout performed.  ? ?Open sided speculum placed into the patient's vagina. Single tooth tenaculum applied to the 12 o'clock position of the cervix. Cervix progressively dilated to a 8 hagar. 30 degree hysteroscope was inserted into the cavity with the aforementioned findings noted. Myosure reach was inserted into the cavity and the polyps were all removed with ease. Cavity inspected at the end of the procedure and no remaining polyps noted. Ostia visualized (initially unable to see the left ostia due to polyp). Hysteroscope removed.  ?Fluid deficit at the end of the case was noted to be 250 cc.  ? ?Tenaculum removed and site stopped bleeding with pressure from sponge stick. Hemostatic at the end of the procedure. All instruments removed. Procedure completed. Counts correct x2. ? ?Pt taken to recovery room in stable condition. ? ? ?Milas Hock, MD ?Attending Obstetrician & Gynecologist, Faculty Practice ?[facility] for [employer], [facility] ? ? ?

## 2022-03-19 NOTE — Transfer of Care (Signed)
Immediate Anesthesia Transfer of Care Note ? ?Patient: Donna [name] ? ?Procedure(s) Performed: DILATATION & CURETTAGE/HYSTEROSCOPY WITH MYOSURE (Uterus) ? ?Patient Location: PACU ? ?Anesthesia Type:General ? ?Level of Consciousness: awake and patient cooperative ? ?Airway & Oxygen Therapy: Patient Spontanous Breathing and Patient connected to nasal cannula oxygen ? ?Post-op Assessment: Report given to RN and Post -op Vital signs reviewed and stable ? ?Post vital signs: Reviewed and stable ? ?Last Vitals:  ?Vitals Value Taken Time  ?BP 115/70 03/19/22 1245  ?Temp    ?Pulse 76 03/19/22 1245  ?Resp 16 03/19/22 1245  ?SpO2 100 % 03/19/22 1245  ?Vitals shown include unvalidated device data. ? ?Last Pain:  ?Vitals:  ? 03/19/22 1020  ?TempSrc: Oral  ?PainSc: 0-No pain  ?   ? ?Patients Stated Pain Goal: 5 (03/19/22 1020) ? ?Complications: No notable events documented. ?

## 2022-03-19 NOTE — Anesthesia Procedure Notes (Signed)
Procedure Name: LMA Insertion ?Date/Time: 03/19/2022 12:08 PM ?Performed by: Earmon Phoenix, CRNA ?Pre-anesthesia Checklist: Patient identified, Emergency Drugs available, Suction available, Patient being monitored and Timeout performed ?Patient Re-evaluated:Patient Re-evaluated prior to induction ?Oxygen Delivery Method: Circle system utilized ?Preoxygenation: Pre-oxygenation with 100% oxygen ?Induction Type: IV induction ?Ventilation: Mask ventilation without difficulty ?LMA: LMA inserted ?LMA Size: 3.0 ?Number of attempts: 1 ?Placement Confirmation: positive ETCO2, CO2 detector and breath sounds checked- equal and bilateral ?Tube secured with: Tape ?Dental Injury: Teeth and Oropharynx as per pre-operative assessment  ? ? ? ? ?

## 2022-03-19 NOTE — Anesthesia Preprocedure Evaluation (Addendum)
Anesthesia Evaluation  ?Patient identified by MRN, date of birth, ID band ?Patient awake ? ? ? ?Reviewed: ?Allergy & Precautions, NPO status , Patient's Chart, lab work & pertinent test results ? ?Airway ?Mallampati: I ? ?TM Distance: >3 FB ?Neck ROM: Full ? ? ? Dental ? ?(+) Teeth Intact, Dental Advisory Given ?  ?Pulmonary ?neg pulmonary ROS,  ?  ?Pulmonary exam normal ?breath sounds clear to auscultation ? ? ? ? ? ? Cardiovascular ?negative cardio ROS ?Normal cardiovascular exam ?Rhythm:Regular Rate:Normal ? ? ?  ?Neuro/Psych ? Headaches, PSYCHIATRIC DISORDERS Anxiety   ? GI/Hepatic ?Neg liver ROS, GERD  Controlled,  ?Endo/Other  ?negative endocrine ROS ? Renal/GU ?negative Renal ROS  ?negative genitourinary ?  ?Musculoskeletal ?negative musculoskeletal ROS ?(+)  ? Abdominal ?  ?Peds ? Hematology ?negative hematology ROS ?(+)   ?Anesthesia Other Findings ? ? Reproductive/Obstetrics ?Pelvic pain, polyp ? ?  ? ? ? ? ? ? ? ? ? ? ? ? ? ?  ?  ? ? ? ? ? ? ? ?Anesthesia Physical ?Anesthesia Plan ? ?ASA: 1 ? ?Anesthesia Plan: General  ? ?Post-op Pain Management: Toradol IV (intra-op)* and Tylenol PO (pre-op)*  ? ?Induction: Intravenous ? ?PONV Risk Score and Plan: 4 or greater and Ondansetron, Dexamethasone, Midazolam, Scopolamine patch - Pre-op and Treatment may vary due to age or medical condition ? ?Airway Management Planned: LMA ? ?Additional Equipment: None ? ?Intra-op Plan:  ? ?Post-operative Plan: Extubation in OR ? ?Informed Consent: I have reviewed the patients History and Physical, chart, labs and discussed the procedure including the risks, benefits and alternatives for the proposed anesthesia with the patient or authorized representative who has indicated his/her understanding and acceptance.  ? ? ? ?Dental advisory given ? ?Plan Discussed with: CRNA ? ?Anesthesia Plan Comments:   ? ? ? ? ? ?Anesthesia Quick Evaluation ? ?

## 2022-03-19 NOTE — Discharge Instructions (Signed)

## 2022-03-20 ENCOUNTER — Encounter (HOSPITAL_BASED_OUTPATIENT_CLINIC_OR_DEPARTMENT_OTHER): Payer: Self-pay | Admitting: Obstetrics and Gynecology

## 2022-03-20 LAB — SURGICAL PATHOLOGY

## 2022-03-27 ENCOUNTER — Ambulatory Visit: Payer: BC Managed Care – PPO | Admitting: Physical Therapy

## 2022-03-27 DIAGNOSIS — M62838 Other muscle spasm: Secondary | ICD-10-CM | POA: Diagnosis not present

## 2022-03-27 DIAGNOSIS — R293 Abnormal posture: Secondary | ICD-10-CM | POA: Diagnosis not present

## 2022-03-27 DIAGNOSIS — M6281 Muscle weakness (generalized): Secondary | ICD-10-CM | POA: Diagnosis not present

## 2022-03-27 DIAGNOSIS — R278 Other lack of coordination: Secondary | ICD-10-CM | POA: Diagnosis not present

## 2022-03-27 DIAGNOSIS — R252 Cramp and spasm: Secondary | ICD-10-CM | POA: Diagnosis not present

## 2022-03-27 NOTE — Therapy (Signed)
?[facility] [place] ?[place], [place], [address] ?Phone: [phone]   Fax:  [phone] ? ?Physical Therapy Treatment ? ?Patient Details  ?Name: Donna [name] ?MRN: [id number] ?Date of Birth: Feb 07, 1991 ?Referring Provider (PT): Radene Gunning, MD ? ? ?Encounter Date: 03/27/2022 ? ? PT End of Session - 03/27/22 1402   ? ? Visit Number 10   ? Date for PT Re-Evaluation 04/06/22   ? Authorization Type BCBS   ? PT Start Time 1402   ? PT Stop Time [id number]   ? PT Time Calculation (min) 41 min   ? Activity Tolerance Patient tolerated treatment well;Patient limited by pain   ? Behavior During Therapy [facility] for tasks assessed/performed   ? ?  ?  ? ?  ? ? ?Past Medical History:  ?Diagnosis Date  ? Acid reflux   ? Anemia   ? History of palpitations   ? ? ?Past Surgical History:  ?Procedure Laterality Date  ? DILATATION & CURETTAGE/HYSTEROSCOPY WITH MYOSURE N/A 03/19/2022  ? Procedure: DILATATION & CURETTAGE/HYSTEROSCOPY WITH MYOSURE;  Surgeon: Radene Gunning, MD;  Location: [facility];  Service: Gynecology;  Laterality: N/A;  ? UPPER GASTROINTESTINAL ENDOSCOPY  2019  ? ? ?There were no vitals filed for this visit. ? ? Subjective Assessment - 03/27/22 1407   ? ? Subjective Pt reports she had procedure and went well, had polyps removed. Bleeding has now stopped. Haven't had pain since some mild cramping post prodecure. Pt also reports she has been doing HEP and focusing on proper kegel mechanics and reports she feels when she is pushing downward and can correct at home.   ? ?  ?  ? ?  ? ? ? ? ? ? ? ? ? ? ? ? ? ? ? ? ? ? ? ? [facility] Adult PT Treatment/Exercise - 03/27/22 0001   ? ?  ? Exercises  ? Exercises Lumbar;Knee/Hip   ?  ? Lumbar Exercises: Stretches  ? Active Hamstring Stretch Right;Left;1 rep;30 seconds   ? Single Knee to Chest Stretch Right;Left;2 reps;30 seconds   ? Hip Flexor Stretch Right;Left;2 reps;30 seconds   ? Pelvic Tilt 10 reps   ? ITB Stretch  Right;Left;1 rep;30 seconds   ? Piriformis Stretch Right;Left;30 seconds;1 rep   ? Other Lumbar Stretch Exercise happy baby 2x45s   ? Other Lumbar Stretch Exercise childs pose x45s; side bending childs pose x45s each; cobra 2x45s   ?  ? Lumbar Exercises: Supine  ? Other Supine Lumbar Exercises ball press hand/knee in hooklying x20 each   ? ?  ?  ? ?  ? ? ? ? ? ? ? ? ? ? ? ? PT Short Term Goals - 03/13/22 0936   ? ?  ? PT SHORT TERM GOAL #1  ? Title pt to be I with HEP   ? Time 5   ? Period Weeks   ? Status Achieved   ? Target Date 01/10/22   ?  ? PT SHORT TERM GOAL #2  ? Title pt to report no more than 5/10 with mobility at pelvis to improve tolerance to exercising and community mobility   ? Time 5   ? Period Weeks   ? Status Achieved   ? Target Date 01/10/22   ?  ? PT SHORT TERM GOAL #3  ? Title pt to demonstrate ability to coordinate pelvic floor and breathing mechanics at least 50% of the time with activity to improve pelvic  floor function.   ? Time 5   ? Period Weeks   ? Status On-going   ? Target Date 01/10/22   ?  ? PT SHORT TERM GOAL #4  ? Title pt to report improved bowel regularity without need of straining to at least 3 BMs per week.   ? Time 5   ? Period Weeks   ? Status Achieved   ? Target Date 01/10/22   ?  ? PT SHORT TERM GOAL #5  ? Title pt to demonstrate at least 3/5 pelvic floor strength and ability to isometrically hold contractions 5s for improved pelvic floor function   ? Time 5   ? Period Weeks   ? Status On-going   ? Target Date 01/10/22   ? ?  ?  ? ?  ? ? ? ? PT Long Term Goals - 03/13/22 0936   ? ?  ? PT LONG TERM GOAL #1  ? Title pt to be I withadvanced  HEP   ? Time 4   ? Status On-going   ? Target Date 04/06/22   ?  ? PT LONG TERM GOAL #2  ? Title pt to report no more than 2/10 with mobility at pelvis to improve tolerance to exercising and community mobility   ? Time 4   ? Period Months   ? Status On-going   ? Target Date 04/06/22   ?  ? PT LONG TERM GOAL #3  ? Title pt to demonstrate  ability to coordinate pelvic floor and breathing mechanics at least 75% of the time with activity to improve pelvic floor function.   ? Time 4   ? Period Months   ? Status On-going   ? Target Date 04/06/22   ?  ? PT LONG TERM GOAL #4  ? Title pt to demonstrate at least 4/5 pelvic floor strength and ability to isometrically hold contractions 8s for improved pelvic floor function   ? Time 4   ? Status On-going   ? Target Date 04/06/22   ?  ? PT LONG TERM GOAL #5  ? Title pt to demonstrate ability tos squat 20# from ground level with proper mechanics with no more than 2/10 pain for improved functional mobility   ? Time 4   ? Period Months   ? Status On-going   ? Target Date 04/06/22   ? ?  ?  ? ?  ? ? ? ? ? ? ? ? Plan - 03/27/22 1440   ? ? Clinical Impression Statement Pt presents to clinic reporting she has not had pain since procedure last week. Pt tolerated well and reports she has been doing HEP. Pt session focused on hip and core stretching and core strengthening as pt not cleared for internal work yet. Pt tolerated well. Pt denied pain at start and end of session. Pt continues to demonstrate need of PT to reassess pelvic floor.   ? Personal Factors and Comorbidities Time since onset of injury/illness/exacerbation   ? Examination-Activity Limitations Lift;Squat;Locomotion Level   ? Examination-Participation Restrictions Interpersonal Relationship;Community Activity;Shop;Occupation   ? Stability/Clinical Decision Making Evolving/Moderate complexity   ? Rehab Potential Good   ? PT Frequency 1x / week   ? PT Duration Other (comment)   10 weeks  ? PT Treatment/Interventions ADLs/Self Care Home Management;Aquatic Therapy;Functional mobility training;Therapeutic activities;Therapeutic exercise;Neuromuscular re-education;Manual techniques;Patient/family education;Taping;Scar mobilization;Passive range of motion;Energy conservation   ? PT Next Visit Plan stretching at hips, breathing mechanics, manual at hips and abdomen    ?  PT [place]   ? Consulted and Agree with Plan of Care Patient   ? ?  ?  ? ?  ? ? ?Patient will benefit from skilled therapeutic intervention in order to improve the following deficits and impairments:  Decreased coordination, Decreased endurance, Pain, Impaired flexibility, Improper body mechanics, Postural dysfunction, Decreased strength, Decreased mobility, Impaired tone, Increased fascial restricitons ? ?Visit Diagnosis: ?Other muscle spasm ? ?Abnormal posture ? ?Muscle weakness (generalized) ? ? ? ? ?Problem List ?Patient Active Problem List  ? Diagnosis Date Noted  ? Intermenstrual spotting   ? Uterine polyp 03/07/2022  ? 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 Somatic dysfunction of spine, sacral 02/28/2022  ? 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 Abnormal CT of brain 12/15/2018  ? Migraine without aura and without status migrainosus, not intractable 12/15/2018  ? Anxiety 05/22/2018  ? Palpitations 05/18/2018  ? Globus sensation 01/15/2017  ?Stacy Gardner, PT, DPT ?03/29/232:47 PM ? ? ?[place] ?[facility] [place] ?[place], [place], [address] ?Phone: [phone]   Fax:  [phone] ? ?Name: Bryannah Gogue ?MRN: [id number] ?Date of Birth: [birth year] ? ? ? ?

## 2022-04-03 ENCOUNTER — Ambulatory Visit: Payer: BC Managed Care – PPO | Attending: Obstetrics and Gynecology | Admitting: Physical Therapy

## 2022-04-03 DIAGNOSIS — M62838 Other muscle spasm: Secondary | ICD-10-CM | POA: Insufficient documentation

## 2022-04-03 DIAGNOSIS — M6281 Muscle weakness (generalized): Secondary | ICD-10-CM | POA: Insufficient documentation

## 2022-04-03 DIAGNOSIS — R279 Unspecified lack of coordination: Secondary | ICD-10-CM | POA: Insufficient documentation

## 2022-04-03 NOTE — Therapy (Addendum)
[facility] [address] [place], [place], [address] Phone: [phone]   Fax:  [phone]  Physical Therapy Treatment  Patient Details  Name: Donna [name] MRN: [id number] Date of Birth: 08-Aug-1991 Referring Provider (PT): Milas Hock, MD   Encounter Date: 04/03/2022   PT End of Session - 04/03/22 1404     Visit Number 11    Date for PT Re-Evaluation 04/06/22    Authorization Type BCBS    PT Start Time 1400    PT Stop Time 1440    PT Time Calculation (min) 40 min    Activity Tolerance Patient tolerated treatment well;Patient limited by pain    Behavior During Therapy [facility] for tasks assessed/performed             Past Medical History:  Diagnosis Date   Acid reflux    Anemia    History of palpitations     Past Surgical History:  Procedure Laterality Date   DILATATION & CURETTAGE/HYSTEROSCOPY WITH MYOSURE N/A 03/19/2022   Procedure: DILATATION & CURETTAGE/HYSTEROSCOPY WITH MYOSURE;  Surgeon: Milas Hock, MD;  Location: [facility];  Service: Gynecology;  Laterality: N/A;   UPPER GASTROINTESTINAL ENDOSCOPY  2019    There were no vitals filed for this visit.   Subjective Assessment - 04/03/22 1405     Subjective Pt reports she is still not cleared for vaginal penetration s/p procedure. Pt reports she did have Lt sided posterior thigh and glute pain last night after mowing the yard but improved with stretching. Pt                            Pelvic Floor Special Questions - 04/03/22 0001     Pelvic Floor Internal Exam deferred as pt is unable to have vaginal penetration s/p polyp removal               [facility] Adult PT Treatment/Exercise - 04/03/22 0001       Self-Care   Self-Care Other Self-Care Comments    Other Self-Care Comments  Pt educated on continued HEP, proper technique for pelvic contractions/relaxations, voiding mechanics, and pelvic relaxation as needed.       Manual Therapy   Manual therapy comments abdminal massage provided as pt had slight fascial release noted at Rt upper quadrant and diaphgram but released with direction technique. PT also assessed external pelvic floor over pt's leggings with pt's consent and no increased tone noted with this at UG triangle and bil obturator internus, no pain noted or increased tone                     PT Education - 04/03/22 1442     Education Details Pt educated to contiue recommendations discussed during POC (voiding mechanics, stress management, pelvic relaxation, HEP, and proper technique with pelvic contractions and relaxation)    Person(s) Educated Patient    Methods Explanation;Demonstration;Tactile cues;Verbal cues;Handout    Comprehension Returned demonstration;Verbalized understanding              PT Short Term Goals - 04/03/22 1411       PT SHORT TERM GOAL #1   Title pt to be I with HEP    Time 5    Period Weeks    Status Achieved    Target Date 01/10/22      PT SHORT TERM GOAL #2   Title pt to report no more than  5/10 with mobility at pelvis to improve tolerance to exercising and community mobility    Time 5    Period Weeks    Status Achieved    Target Date 01/10/22      PT SHORT TERM GOAL #3   Title pt to demonstrate ability to coordinate pelvic floor and breathing mechanics at least 50% of the time with activity to improve pelvic floor function.    Time 5    Period Weeks    Status On-going    Target Date 01/10/22      PT SHORT TERM GOAL #4   Title pt to report improved bowel regularity without need of straining to at least 3 BMs per week.    Time 5    Period Weeks    Status Achieved    Target Date 01/10/22      PT SHORT TERM GOAL #5   Title pt to demonstrate at least 3/5 pelvic floor strength and ability to isometrically hold contractions 5s for improved pelvic floor function    Time 5    Period Weeks    Status On-going    Target Date 01/10/22                PT Long Term Goals - 04/03/22 1412       PT LONG TERM GOAL #1   Title pt to be I withadvanced  HEP    Time 4    Status Achieved    Target Date 04/06/22      PT LONG TERM GOAL #2   Title pt to report no more than 2/10 with mobility at pelvis to improve tolerance to exercising and community mobility    Time 4    Period Months    Status Achieved    Target Date 04/06/22      PT LONG TERM GOAL #3   Title pt to demonstrate ability to coordinate pelvic floor and breathing mechanics at least 75% of the time with activity to improve pelvic floor function.    Time 4    Period Months    Status On-going    Target Date 04/06/22      PT LONG TERM GOAL #4   Title pt to demonstrate at least 4/5 pelvic floor strength and ability to isometrically hold contractions 8s for improved pelvic floor function    Time 4    Status On-going    Target Date 04/06/22      PT LONG TERM GOAL #5   Title pt to demonstrate ability tos squat 20# from ground level with proper mechanics with no more than 2/10 pain for improved functional mobility    Time 4    Period Months    Status Achieved    Target Date 04/06/22                   Plan - 04/03/22 1443     Clinical Impression Statement Pt presents to clinic reporting she has not been having pelvic pain or abdominal pain, thinks bleeding that was initially happening has resolved and caused by polyps which she had removed. Pt comfortable continuing with HEP and PT recommendations for voiding mechanics, breathing mechanics, HEP, abdominal massage, stess management, pelvic relaxation techniques, and understans proper technique for pelvic floor mobility however pt requesting to have time to continue this on her own prior to DC to make her more comfortable with continuing without PT. PT agreeable to one additional appointment to allow pt ~1 month to  continue recommendations and monitor if symptoms remain improved. Pt agreed to this. If  symptoms return, additional visits and/or interventions may be indicated.    Personal Factors and Comorbidities Time since onset of injury/illness/exacerbation    Examination-Activity Limitations Lift;Squat;Locomotion Level    Examination-Participation Restrictions Interpersonal Relationship;Community Activity;Shop;Occupation    Stability/Clinical Decision Making Evolving/Moderate complexity    Rehab Potential Good    PT Frequency 1x / week    PT Duration Other (comment)   10 weeks   PT Treatment/Interventions ADLs/Self Care Home Management;Aquatic Therapy;Functional mobility training;Therapeutic activities;Therapeutic exercise;Neuromuscular re-education;Manual techniques;Patient/family education;Taping;Scar mobilization;Passive range of motion;Energy conservation    PT Next Visit Plan follow up with progress with one additional visit    PT Home Exercise Plan [id number]    Consulted and Agree with Plan of Care Patient             Patient will benefit from skilled therapeutic intervention in order to improve the following deficits and impairments:  Decreased coordination, Decreased endurance, Pain, Impaired flexibility, Improper body mechanics, Postural dysfunction, Decreased strength, Decreased mobility, Impaired tone, Increased fascial restricitons  Visit Diagnosis: Muscle weakness (generalized)  Other muscle spasm  Unspecified lack of coordination     Problem List Patient Active Problem List   Diagnosis Date Noted   Intermenstrual spotting    Uterine polyp 03/07/2022   SI (sacroiliac) joint dysfunction 02/28/2022   Somatic dysfunction of spine, sacral 02/28/2022   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 [place], [place], [address] Phone: [phone]   Fax:  [phone]  Name: Donna [name] MRN: [id number] Date of Birth: 12/08/1991    PHYSICAL THERAPY DISCHARGE SUMMARY  Visits from Start of Care: 11  Current functional level related to goals / functional outcomes: Pt reports (after last session 04/03/22) that she is feeling confident and much better and feels she did not need last appt.    Remaining deficits: Pt reports no longer having symptoms.    Education / Equipment: HEP   Patient agrees to discharge. Patient goals were partially met. Patient is being discharged due to being pleased with the current functional level.   Otelia Sergeant, PT, DPT 05/09/2309:06 AM

## 2022-04-03 NOTE — Progress Notes (Signed)
?Terrilee Files D.O. ?[facility] ?[address] [place] [address] ?Phone: [phone] ?Subjective:   ?I, Wilford Grist, am serving as a scribe for Dr. Antoine Primas. ? ?This visit occurred during the SARS-CoV-2 public health emergency.  Safety protocols were in place, including screening questions prior to the visit, additional usage of staff PPE, and extensive cleaning of exam room while observing appropriate contact time as indicated for disinfecting solutions.  ? ?I'm seeing this patient by the request  of:  Olive Bass, FNP ? ?CC: Back and neck pain follow-up ? ?[id number]  ?Laterra Lubinski is a 31 y.o. female coming in with complaint of back and neck pain. OMT 02/28/2022. Patient states that she has been able to crack her back more since having surgery on 03/19/2022. Pain in L shoulder and  R ankle have worsened. Pain constant 5/10 pain in shoulder and 3/10 in ankle. Pain worse with sleeping.  ? ?Medications patient has been prescribed: None ? ? ? ?  ? ? ? ? ?Reviewed prior external information including notes and imaging from previsou exam, outside providers and external EMR if available.  ? ?As well as notes that were available from care everywhere and other healthcare systems. ? ?Past medical history, social, surgical and family history all reviewed in electronic medical record.  No pertanent information unless stated regarding to the chief complaint.  ? ?Past Medical History:  ?Diagnosis Date  ? Acid reflux   ? Anemia   ? History of palpitations   ?  ?Allergies  ?Allergen Reactions  ? Penicillin G Rash  ? Sulfamethoxazole Rash  ? ? ? ?Review of Systems: ? No headache, visual changes, nausea, vomiting, diarrhea, constipation, dizziness, abdominal pain, skin rash, fevers, chills, night sweats, weight loss, swollen lymph nodes, body aches, joint swelling, chest pain, shortness of breath, mood changes. POSITIVE muscle aches ? ?Objective  ?Blood pressure 104/76, pulse 82, height 5\' 3"   (1.6 m), weight 131 lb (59.4 kg), last menstrual period 03/08/2022, SpO2 99 %. ?  ?General: No apparent distress alert and oriented x3 mood and affect normal, dressed appropriately.  ?HEENT: Pupils equal, extraocular movements intact  ?Respiratory: Patient's speak in full sentences and does not appear short of breath  ?Cardiovascular: No lower extremity edema, non tender, no erythema  ?Back exam does have some mild loss of lordosis.  Some tenderness to palpation in the paraspinal musculature.  Neck exam did have some increasing in tenderness to palpation over the left side of the paraspinal musculature of the trapezius.  Patient also has some tenderness over the right sacroiliac joint. ? ?Left shoulder exam does have some tenderness to palpation.  Patient has mild positive crossover noted. ? ? ? ?. ? ?Osteopathic findings ? ?C2 flexed rotated and side bent right ?C6 flexed rotated and side bent left ?T3 extended rotated and side bent left inhaled rib ?T9 extended rotated and side bent left ?L3 flexed rotated and side bent right ?Sacrum right on right ? ? ? ? ?  ?Assessment and Plan: ? ?SI (sacroiliac) joint dysfunction ?Patient does have more sacroiliac joint dysfunction.  Does have seem to have increase in tightness as well.  Patient still has some difficulty with some iron deficiency that could also be causing some more muscle discomfort and fatigue.  Increasing activity slowly.  We will see how patient does overall but if any worsening pain may need to consider advanced imaging but I think patient will do okay. ?  ? ?Nonallopathic problems ? ?Decision  today to treat with OMT was based on Physical Exam ? ?After verbal consent patient was treated with HVLA, ME, FPR techniques in cervical, rib, thoracic, lumbar, and sacral  areas ? ?Patient tolerated the procedure well with improvement in symptoms ? ?Patient given exercises, stretches and lifestyle modifications ? ?See medications in patient instructions if  given ? ?Patient will follow up in 4-8 weeks ? ?  ? ?The above documentation has been reviewed and is accurate and complete Judi Saa, DO ? ? ? ?  ? ? Note: This dictation was prepared with Dragon dictation along with smaller phrase technology. Any transcriptional errors that result from this process are unintentional.    ?  ?  ? ?

## 2022-04-04 ENCOUNTER — Ambulatory Visit: Payer: BC Managed Care – PPO | Admitting: Family Medicine

## 2022-04-04 ENCOUNTER — Encounter: Payer: Self-pay | Admitting: Family Medicine

## 2022-04-04 VITALS — BP 104/76 | HR 82 | Ht 63.0 in | Wt 131.0 lb

## 2022-04-04 DIAGNOSIS — M9903 Segmental and somatic dysfunction of lumbar region: Secondary | ICD-10-CM

## 2022-04-04 DIAGNOSIS — M9902 Segmental and somatic dysfunction of thoracic region: Secondary | ICD-10-CM | POA: Diagnosis not present

## 2022-04-04 DIAGNOSIS — M9904 Segmental and somatic dysfunction of sacral region: Secondary | ICD-10-CM

## 2022-04-04 DIAGNOSIS — M533 Sacrococcygeal disorders, not elsewhere classified: Secondary | ICD-10-CM | POA: Diagnosis not present

## 2022-04-04 DIAGNOSIS — M9908 Segmental and somatic dysfunction of rib cage: Secondary | ICD-10-CM

## 2022-04-04 DIAGNOSIS — M9901 Segmental and somatic dysfunction of cervical region: Secondary | ICD-10-CM | POA: Diagnosis not present

## 2022-04-04 NOTE — Patient Instructions (Signed)
Keep doing exercises and Vit  ?Hubby does lawn mowing ?See me in 6-8 weeks ?

## 2022-04-04 NOTE — Assessment & Plan Note (Signed)
Patient does have more sacroiliac joint dysfunction.  Does have seem to have increase in tightness as well.  Patient still has some difficulty with some iron deficiency that could also be causing some more muscle discomfort and fatigue.  Increasing activity slowly.  We will see how patient does overall but if any worsening pain may need to consider advanced imaging but I think patient will do okay. ?

## 2022-05-06 ENCOUNTER — Encounter: Payer: Self-pay | Admitting: Physical Therapy

## 2022-05-08 ENCOUNTER — Encounter: Payer: BC Managed Care – PPO | Admitting: Physical Therapy

## 2022-05-15 NOTE — Progress Notes (Signed)
[name] [address] [place] [address] Phone: [phone] Subjective:   [name], am serving as a scribe for Dr. Antoine Primas. This visit occurred during the SARS-CoV-2 public health emergency.  Safety protocols were in place, including screening questions prior to the visit, additional usage of staff PPE, and extensive cleaning of exam room while observing appropriate contact time as indicated for disinfecting solutions.   I'm seeing this patient by the request  of:  Olive Bass, FNP  CC: Back pain and neck pain follow-up  [id number]  Laverda Stribling is a 31 y.o. female coming in with complaint of back and neck pain. OMT 04/04/2022. Patient states same per usual. No new complaints.  Medications patient has been prescribed: None  Taking:         Reviewed prior external information including notes and imaging from previsou exam, outside providers and external EMR if available.   As well as notes that were available from care everywhere and other healthcare systems.  Past medical history, social, surgical and family history all reviewed in electronic medical record.  No pertanent information unless stated regarding to the chief complaint.   Past Medical History:  Diagnosis Date   Acid reflux    Anemia    History of palpitations     Allergies  Allergen Reactions   Penicillin G Rash   Sulfamethoxazole Rash     Review of Systems:  No headache, visual changes, nausea, vomiting, diarrhea, constipation, dizziness, abdominal pain, skin rash, fevers, chills, night sweats, weight loss, swollen lymph nodes, body aches, joint swelling, chest pain, shortness of breath, mood changes. POSITIVE muscle aches  Objective  Blood pressure 116/68, pulse 87, height 5\' 3"  (1.6 m), weight 136 lb (61.7 kg), SpO2 98 %.   General: No apparent distress alert and oriented x3 mood and affect normal, dressed appropriately.  HEENT: Pupils  equal, extraocular movements intact  Respiratory: Patient's speak in full sentences and does not appear short of breath  Cardiovascular: No lower extremity edema, non tender, no erythema  Back exam shows some mild loss of lordosis.  Some tenderness to palpation in the paraspinal musculature.  Patient does have tightness over the sacroiliac joint as well.  Mild scoliosis noted.  Osteopathic findings  C6 flexed rotated and side bent left T3 extended rotated and side bent right inhaled rib T8 extended rotated and side bent left inhaled rib L4 flexed rotated and side bent left Sacrum right on right       Assessment and Plan:  SI (sacroiliac) joint dysfunction Patient is responding relatively well to the osteopathic manipulation.  Patient does have a chronic problem with exacerbation noted.  Discussed with patient about icing regimen and home exercises.  Patient does respond to manipulation but also we added Zanaflex for any breakthrough.  Follow-up again in 6 to 8 weeks otherwise.   Nonallopathic problems  Decision today to treat with OMT was based on Physical Exam  After verbal consent patient was treated with HVLA, ME, FPR techniques in cervical, rib, thoracic, lumbar, and sacral  areas  Patient tolerated the procedure well with improvement in symptoms  Patient given exercises, stretches and lifestyle modifications  See medications in patient instructions if given  Patient will follow up in 4-8 weeks      The above documentation has been reviewed and is accurate and complete , DO        Note: This dictation was prepared with Dragon dictation  along with smaller phrase technology. Any transcriptional errors that result from this process are unintentional.

## 2022-05-16 ENCOUNTER — Ambulatory Visit: Payer: BC Managed Care – PPO | Admitting: Family Medicine

## 2022-05-16 VITALS — BP 116/68 | HR 87 | Ht 63.0 in | Wt 136.0 lb

## 2022-05-16 DIAGNOSIS — M533 Sacrococcygeal disorders, not elsewhere classified: Secondary | ICD-10-CM

## 2022-05-16 DIAGNOSIS — M9904 Segmental and somatic dysfunction of sacral region: Secondary | ICD-10-CM | POA: Diagnosis not present

## 2022-05-16 DIAGNOSIS — M9901 Segmental and somatic dysfunction of cervical region: Secondary | ICD-10-CM | POA: Diagnosis not present

## 2022-05-16 DIAGNOSIS — M9908 Segmental and somatic dysfunction of rib cage: Secondary | ICD-10-CM | POA: Diagnosis not present

## 2022-05-16 DIAGNOSIS — M9903 Segmental and somatic dysfunction of lumbar region: Secondary | ICD-10-CM | POA: Diagnosis not present

## 2022-05-16 DIAGNOSIS — M9902 Segmental and somatic dysfunction of thoracic region: Secondary | ICD-10-CM

## 2022-05-16 MED ORDER — TIZANIDINE HCL 2 MG PO TABS: 2.0000 mg | ORAL_TABLET | Freq: Every day | ORAL | 2 refills | Status: AC

## 2022-05-16 NOTE — Patient Instructions (Addendum)
Great to see you asways  Ok to workout but like the progression we discussed Zanaflex if you need it at night See me again in 6-8 weeks

## 2022-05-16 NOTE — Assessment & Plan Note (Signed)
Patient is responding relatively well to the osteopathic manipulation.  Patient does have a chronic problem with exacerbation noted.  Discussed with patient about icing regimen and home exercises.  Patient does respond to manipulation but also we added Zanaflex for any breakthrough.  Follow-up again in 6 to 8 weeks otherwise.

## 2022-06-27 ENCOUNTER — Ambulatory Visit: Payer: BC Managed Care – PPO | Admitting: Family Medicine

## 2022-06-27 DIAGNOSIS — F4323 Adjustment disorder with mixed anxiety and depressed mood: Secondary | ICD-10-CM | POA: Diagnosis not present

## 2022-06-28 DIAGNOSIS — F4323 Adjustment disorder with mixed anxiety and depressed mood: Secondary | ICD-10-CM | POA: Diagnosis not present

## 2022-07-12 DIAGNOSIS — F4323 Adjustment disorder with mixed anxiety and depressed mood: Secondary | ICD-10-CM | POA: Diagnosis not present

## 2022-07-26 DIAGNOSIS — F4323 Adjustment disorder with mixed anxiety and depressed mood: Secondary | ICD-10-CM | POA: Diagnosis not present

## 2022-08-08 NOTE — Progress Notes (Signed)
[name] [address] [place] [address] Phone: [phone] Subjective:   Donna [name], am serving as a scribe for Dr. Antoine Primas.  I'm seeing this patient by the request  of:  Olive Bass, FNP  CC: Neck and back pain follow-up  [id number]  [name] is a 31 y.o. female coming in with complaint of back and neck pain. OMT 05/16/2022 Patient states that her lower back is bothering her more than other areas of the spine. Pain in middle of lumbar spine near L5.  Patient states that it can be uncomfortable and seems to be mostly at night.  States during the day uncomfortable but not really stopping her from activities.  Medications patient has been prescribed: None  Taking:         Reviewed prior external information including notes and imaging from previsou exam, outside providers and external EMR if available.   As well as notes that were available from care everywhere and other healthcare systems.  Past medical history, social, surgical and family history all reviewed in electronic medical record.  No pertanent information unless stated regarding to the chief complaint.   Past Medical History:  Diagnosis Date   Acid reflux    Anemia    History of palpitations     Allergies  Allergen Reactions   Penicillin G Rash   Sulfamethoxazole Rash     Review of Systems:  No headache, visual changes, nausea, vomiting, diarrhea, constipation, dizziness, abdominal pain, skin rash, fevers, chills, night sweats, weight loss, swollen lymph nodes, body aches, joint swelling, chest pain, shortness of breath, mood changes. POSITIVE muscle aches  Objective  Blood pressure 102/82, pulse 91, height 5\' 3"  (1.6 m), weight 129 lb (58.5 kg), SpO2 99 %.   General: No apparent distress alert and oriented x3 mood and affect normal, dressed appropriately.  HEENT: Pupils equal, extraocular movements intact  Respiratory: Patient's speak  in full sentences and does not appear short of breath  Cardiovascular: No lower extremity edema, non tender, no erythema  Gait MSK:  Back low back does have hypermobility noted otherwise been in extension.  He does have tenderness to palpation mostly over the sacroiliac joints bilaterally right greater than left.  Negative FABER test bilaterally though.  Negative straight leg test.  Osteopathic findings  C4 flexed rotated and side bent left T3 extended rotated and side bent right inhaled rib T6 extended rotated and side bent left L2 flexed rotated and side bent right L5 flexed rotated and side bent left Sacrum right on right       Assessment and Plan:  SI (sacroiliac) joint dysfunction Discussed with patient about icing regimen and home exercises.  Patient does have chronic problem with exacerbation.  Does have some hypermobility but does contribute.  Does respond extremely well though to osteopathic manipulation.  Continuing to try to do mostly over-the-counter medications but does have the ibuprofen.  Encourage patient to potentially increase the tart cherry and see if that will be helpful.  Continue the iron supplementation.  Follow-up again in 6 to 8 weeks    Nonallopathic problems  Decision today to treat with OMT was based on Physical Exam  After verbal consent patient was treated with HVLA, ME, FPR techniques in cervical, rib, thoracic, lumbar, and sacral  areas  Patient tolerated the procedure well with improvement in symptoms  Patient given exercises, stretches and lifestyle modifications  See medications in patient instructions if given  Patient will  follow up in 4-8 weeks    The above documentation has been reviewed and is accurate and complete Lyndal Pulley, DO          Note: This dictation was prepared with Dragon dictation along with smaller phrase technology. Any transcriptional errors that result from this process are unintentional.

## 2022-08-12 ENCOUNTER — Ambulatory Visit: Payer: BC Managed Care – PPO | Admitting: Family Medicine

## 2022-08-12 ENCOUNTER — Encounter: Payer: Self-pay | Admitting: Family Medicine

## 2022-08-12 VITALS — BP 102/82 | HR 91 | Ht 63.0 in | Wt 129.0 lb

## 2022-08-12 DIAGNOSIS — M9904 Segmental and somatic dysfunction of sacral region: Secondary | ICD-10-CM | POA: Diagnosis not present

## 2022-08-12 DIAGNOSIS — M9908 Segmental and somatic dysfunction of rib cage: Secondary | ICD-10-CM | POA: Diagnosis not present

## 2022-08-12 DIAGNOSIS — M9903 Segmental and somatic dysfunction of lumbar region: Secondary | ICD-10-CM | POA: Diagnosis not present

## 2022-08-12 DIAGNOSIS — M533 Sacrococcygeal disorders, not elsewhere classified: Secondary | ICD-10-CM | POA: Diagnosis not present

## 2022-08-12 DIAGNOSIS — M9901 Segmental and somatic dysfunction of cervical region: Secondary | ICD-10-CM

## 2022-08-12 DIAGNOSIS — M9902 Segmental and somatic dysfunction of thoracic region: Secondary | ICD-10-CM

## 2022-08-12 NOTE — Patient Instructions (Addendum)
Try to do exercises regularly Iron 65mg  with 500mg  of Vit C Double up night time med See me in 5-6 weeks

## 2022-08-12 NOTE — Assessment & Plan Note (Signed)
Discussed with patient about icing regimen and home exercises.  Patient does have chronic problem with exacerbation.  Does have some hypermobility but does contribute.  Does respond extremely well though to osteopathic manipulation.  Continuing to try to do mostly over-the-counter medications but does have the ibuprofen.  Encourage patient to potentially increase the tart cherry and see if that will be helpful.  Continue the iron supplementation.  Follow-up again in 6 to 8 weeks

## 2022-08-16 DIAGNOSIS — F4323 Adjustment disorder with mixed anxiety and depressed mood: Secondary | ICD-10-CM | POA: Diagnosis not present

## 2022-08-30 DIAGNOSIS — F4323 Adjustment disorder with mixed anxiety and depressed mood: Secondary | ICD-10-CM | POA: Diagnosis not present

## 2022-09-09 ENCOUNTER — Encounter: Payer: Self-pay | Admitting: Family Medicine

## 2022-09-12 NOTE — Progress Notes (Unsigned)
Donna [name] [address] [place] [address] Phone: [phone] Subjective:   Donna [name], am serving as a scribe for Dr. Antoine Primas.  I'm seeing this patient by the request  of:  Donna Bass, FNP  CC: Back and neck pain follow-up  [id number]  Donna [name] is a 31 y.o. female coming in with complaint of back and neck pain. OMT 08/12/2022. Patient states has slept wrong a couple of times. Having her usual pains. No new issues.   Medications patient has been prescribed: None  Taking:         Reviewed prior external information including notes and imaging from previsou exam, outside providers and external EMR if available.   As well as notes that were available from care everywhere and other healthcare systems.  Past medical history, social, surgical and family history all reviewed in electronic medical record.  No pertanent information unless stated regarding to the chief complaint.   Past Medical History:  Diagnosis Date   Acid reflux    Anemia    History of palpitations     Allergies  Allergen Reactions   Penicillin G Rash   Sulfamethoxazole Rash     Review of Systems:  No headache, visual changes, nausea, vomiting, diarrhea, constipation, dizziness, abdominal pain, skin rash, fevers, chills, night sweats, weight loss, swollen lymph nodes, body aches, joint swelling, chest pain, shortness of breath, mood changes. POSITIVE muscle aches  Objective  Blood pressure 102/66, pulse 94, height 5\' 3"  (1.6 m), weight 128 lb (58.1 kg), SpO2 99 %.   General: No apparent distress alert and oriented x3 mood and affect normal, dressed appropriately.  HEENT: Pupils equal, extraocular movements intact  Respiratory: Patient's speak in full sentences and does not appear short of breath  Cardiovascular: No lower extremity edema, non tender, no erythema  Gait MSK:  Back does have loss of lordosis.  Some tenderness to palpation  in the paraspinal musculature.  Patient does have more tightness noted in the left parascapular region.  Does have more tightness in the center of the paraspinal musculature of the neck as well.  Osteopathic findings  C2 flexed rotated and side bent right C6 flexed rotated and side bent left T4 extended rotated and side bent left inhaled rib T8 extended rotated and side bent left inhaled rib L2 flexed rotated and side bent right Sacrum right on right       Assessment and Plan:  SI (sacroiliac) joint dysfunction Continue tightness, have more tightness of the upper left side of the back.  Patient is starting to workout on a more regular basis which I think will be significantly beneficial for this individual.  Discussed icing regimen and home exercises.  Patient is taking over-the-counter medications.  Holding on any prescription medications.  Follow-up with me again in 6 to 8 weeks.  Could be potentially attempting to become pregnant in the near future normal did discuss that if so we usually avoid osteopathic integration in the first trimester.  Patient verbalized understanding.    Nonallopathic problems  Decision today to treat with OMT was based on Physical Exam  After verbal consent patient was treated with HVLA, ME, FPR techniques in cervical, rib, thoracic, lumbar, and sacral  areas  Patient tolerated the procedure well with improvement in symptoms  Patient given exercises, stretches and lifestyle modifications  See medications in patient instructions if given  Patient will follow up in 4-8 weeks     The above  documentation has been reviewed and is accurate and complete Donna Saa, DO         Note: This dictation was prepared with Dragon dictation along with smaller phrase technology. Any transcriptional errors that result from this process are unintentional.

## 2022-09-13 DIAGNOSIS — F4323 Adjustment disorder with mixed anxiety and depressed mood: Secondary | ICD-10-CM | POA: Diagnosis not present

## 2022-09-17 ENCOUNTER — Ambulatory Visit (INDEPENDENT_AMBULATORY_CARE_PROVIDER_SITE_OTHER): Payer: BC Managed Care – PPO | Admitting: Family Medicine

## 2022-09-17 VITALS — BP 102/66 | HR 94 | Ht 63.0 in | Wt 128.0 lb

## 2022-09-17 DIAGNOSIS — M9908 Segmental and somatic dysfunction of rib cage: Secondary | ICD-10-CM | POA: Diagnosis not present

## 2022-09-17 DIAGNOSIS — M9904 Segmental and somatic dysfunction of sacral region: Secondary | ICD-10-CM | POA: Diagnosis not present

## 2022-09-17 DIAGNOSIS — M533 Sacrococcygeal disorders, not elsewhere classified: Secondary | ICD-10-CM

## 2022-09-17 DIAGNOSIS — M9901 Segmental and somatic dysfunction of cervical region: Secondary | ICD-10-CM

## 2022-09-17 DIAGNOSIS — M9902 Segmental and somatic dysfunction of thoracic region: Secondary | ICD-10-CM

## 2022-09-17 DIAGNOSIS — M9903 Segmental and somatic dysfunction of lumbar region: Secondary | ICD-10-CM | POA: Diagnosis not present

## 2022-09-17 NOTE — Patient Instructions (Addendum)
Good to see you! Consider adding YTA exercise I like the balance of exercise you're doing

## 2022-09-17 NOTE — Assessment & Plan Note (Signed)
Continue tightness, have more tightness of the upper left side of the back.  Patient is starting to workout on a more regular basis which I think will be significantly beneficial for this individual.  Discussed icing regimen and home exercises.  Patient is taking over-the-counter medications.  Holding on any prescription medications.  Follow-up with me again in 6 to 8 weeks.  Could be potentially attempting to become pregnant in the near future normal did discuss that if so we usually avoid osteopathic integration in the first trimester.  Patient verbalized understanding.

## 2022-09-27 DIAGNOSIS — F4323 Adjustment disorder with mixed anxiety and depressed mood: Secondary | ICD-10-CM | POA: Diagnosis not present

## 2022-10-11 DIAGNOSIS — F4323 Adjustment disorder with mixed anxiety and depressed mood: Secondary | ICD-10-CM | POA: Diagnosis not present

## 2022-10-25 DIAGNOSIS — F4323 Adjustment disorder with mixed anxiety and depressed mood: Secondary | ICD-10-CM | POA: Diagnosis not present

## 2022-10-28 NOTE — Progress Notes (Unsigned)
Donna [name] Phone: [phone] Subjective:   Donna [name], am serving as a scribe for Dr. Hulan Saas.  I'm seeing this patient by the request  of:  Marrian Salvage, FNP  CC: Back and neck pain follow-up  [id number]  Donna [name] is a 31 y.o. female coming in with complaint of back and neck pain. OMT 09/17/2022. Patient states that her back is the same as last visit.   Medications patient has been prescribed: zanaflex  Taking:         Reviewed prior external information including notes and imaging from previsou exam, outside providers and external EMR if available.   As well as notes that were available from care everywhere and other healthcare systems.  Past medical history, social, surgical and family history all reviewed in electronic medical record.  [name] pertanent information unless stated regarding to the chief complaint.   Past Medical History:  Diagnosis Date   Acid reflux    Anemia    History of palpitations     Allergies  Allergen Reactions   Penicillin G Rash   Sulfamethoxazole Rash     Review of Systems:  [name] headache, visual changes, nausea, vomiting, diarrhea, constipation, dizziness, abdominal pain, skin rash, fevers, chills, night sweats, weight loss, swollen lymph nodes, body aches, joint swelling, chest pain, shortness of breath, mood changes. POSITIVE muscle aches  Objective  Blood pressure 100/64, pulse 74, height 5\' 3"  (1.6 m), weight 128 lb (58.1 kg), SpO2 98 %.   General: [name] apparent distress alert and oriented x3 mood and affect normal, dressed appropriately.  HEENT: Pupils equal, extraocular movements intact  Respiratory: Patient's speak in full sentences and does not appear short of breath  Cardiovascular: [name] lower extremity edema, non tender, [name] erythema  Gait MSK:  Back back does still have significant tenderness around the sacroiliac joint.  Patient  does have more tightness noted in the left parascapular region than usual.  Some limited sidebending of the neck bilaterally right greater than left.  Patient does have some lacking 5 degrees of extension.  5 out of 5 strength noted though.  Osteopathic findings  C5 flexed rotated and side bent left T5 extended rotated and side bent left inhaled rib L2 flexed rotated and side bent right Sacrum right on right       Assessment and Plan:  SI (sacroiliac) joint dysfunction Continues to have some discomfort and pain.  Still only taking ibuprofen 800 mg 3 times a day as needed.  He discussed with patient about icing regimen and home exercises, which activities to do and which ones to avoid.  Increase activity slowly.  Follow-up with me again in 6 to 8 weeks.    Nonallopathic problems  Decision today to treat with OMT was based on Physical Exam  After verbal consent patient was treated with HVLA, ME, FPR techniques in cervical, rib, thoracic, lumbar, and sacral  areas  Patient tolerated the procedure well with improvement in symptoms  Patient given exercises, stretches and lifestyle modifications  See medications in patient instructions if given  Patient will follow up in 4-8 weeks     The above documentation has been reviewed and is accurate and complete Lyndal Pulley, DO         Note: This dictation was prepared with Dragon dictation along with smaller phrase technology. Any transcriptional errors that result from this process are unintentional.

## 2022-10-29 ENCOUNTER — Ambulatory Visit (INDEPENDENT_AMBULATORY_CARE_PROVIDER_SITE_OTHER): Payer: BC Managed Care – PPO | Admitting: Family Medicine

## 2022-10-29 ENCOUNTER — Encounter: Payer: Self-pay | Admitting: Family Medicine

## 2022-10-29 VITALS — BP 100/64 | HR 74 | Ht 63.0 in | Wt 128.0 lb

## 2022-10-29 DIAGNOSIS — M9903 Segmental and somatic dysfunction of lumbar region: Secondary | ICD-10-CM

## 2022-10-29 DIAGNOSIS — M9908 Segmental and somatic dysfunction of rib cage: Secondary | ICD-10-CM

## 2022-10-29 DIAGNOSIS — M9901 Segmental and somatic dysfunction of cervical region: Secondary | ICD-10-CM | POA: Diagnosis not present

## 2022-10-29 DIAGNOSIS — M9904 Segmental and somatic dysfunction of sacral region: Secondary | ICD-10-CM | POA: Diagnosis not present

## 2022-10-29 DIAGNOSIS — M533 Sacrococcygeal disorders, not elsewhere classified: Secondary | ICD-10-CM

## 2022-10-29 DIAGNOSIS — M9902 Segmental and somatic dysfunction of thoracic region: Secondary | ICD-10-CM | POA: Diagnosis not present

## 2022-10-29 NOTE — Patient Instructions (Addendum)
Good to see you  Letter given for work  [place] total support  Coop pillow Continue to work on posture See me in 6-8 weeks

## 2022-10-29 NOTE — Assessment & Plan Note (Signed)
Continues to have some discomfort and pain.  Still only taking ibuprofen 800 mg 3 times a day as needed.  He discussed with patient about icing regimen and home exercises, which activities to do and which ones to avoid.  Increase activity slowly.  Follow-up with me again in 6 to 8 weeks.

## 2022-11-19 DIAGNOSIS — F4323 Adjustment disorder with mixed anxiety and depressed mood: Secondary | ICD-10-CM | POA: Diagnosis not present

## 2022-12-03 DIAGNOSIS — F4323 Adjustment disorder with mixed anxiety and depressed mood: Secondary | ICD-10-CM | POA: Diagnosis not present

## 2022-12-19 DIAGNOSIS — F4323 Adjustment disorder with mixed anxiety and depressed mood: Secondary | ICD-10-CM | POA: Diagnosis not present

## 2022-12-27 NOTE — Progress Notes (Unsigned)
  [name] [address] [place] [address] Phone: [phone] Subjective:    I'm seeing this patient by the request  of:  Olive Bass, FNP  CC:   [id number]  Donna [name] is a 31 y.o. female coming in with complaint of back and neck pain. OMT10/31/2023. Patient states   Medications patient has been prescribed: None  Taking:         Reviewed prior external information including notes and imaging from previsou exam, outside providers and external EMR if available.   As well as notes that were available from care everywhere and other healthcare systems.  Past medical history, social, surgical and family history all reviewed in electronic medical record.  No pertanent information unless stated regarding to the chief complaint.   Past Medical History:  Diagnosis Date   Acid reflux    Anemia    History of palpitations     Allergies  Allergen Reactions   Penicillin G Rash   Sulfamethoxazole Rash     Review of Systems:  No headache, visual changes, nausea, vomiting, diarrhea, constipation, dizziness, abdominal pain, skin rash, fevers, chills, night sweats, weight loss, swollen lymph nodes, body aches, joint swelling, chest pain, shortness of breath, mood changes. POSITIVE muscle aches  Objective  There were no vitals taken for this visit.   General: No apparent distress alert and oriented x3 mood and affect normal, dressed appropriately.  HEENT: Pupils equal, extraocular movements intact  Respiratory: Patient's speak in full sentences and does not appear short of breath  Cardiovascular: No lower extremity edema, non tender, no erythema  Gait MSK:  Back   Osteopathic findings  C2 flexed rotated and side bent right C6 flexed rotated and side bent left T3 extended rotated and side bent right inhaled rib T9 extended rotated and side bent left L2 flexed rotated and side bent right Sacrum right on right        Assessment and Plan:  No problem-specific Assessment & Plan notes found for this encounter.    Nonallopathic problems  Decision today to treat with OMT was based on Physical Exam  After verbal consent patient was treated with HVLA, ME, FPR techniques in cervical, rib, thoracic, lumbar, and sacral  areas  Patient tolerated the procedure well with improvement in symptoms  Patient given exercises, stretches and lifestyle modifications  See medications in patient instructions if given  Patient will follow up in 4-8 weeks             Note: This dictation was prepared with Dragon dictation along with smaller phrase technology. Any transcriptional errors that result from this process are unintentional.

## 2023-01-02 ENCOUNTER — Encounter: Payer: Self-pay | Admitting: Family Medicine

## 2023-01-02 ENCOUNTER — Ambulatory Visit (INDEPENDENT_AMBULATORY_CARE_PROVIDER_SITE_OTHER): Payer: BC Managed Care – PPO | Admitting: Family Medicine

## 2023-01-02 VITALS — BP 120/82 | HR 102 | Ht 63.0 in | Wt 128.0 lb

## 2023-01-02 DIAGNOSIS — M9901 Segmental and somatic dysfunction of cervical region: Secondary | ICD-10-CM | POA: Diagnosis not present

## 2023-01-02 DIAGNOSIS — M533 Sacrococcygeal disorders, not elsewhere classified: Secondary | ICD-10-CM | POA: Diagnosis not present

## 2023-01-02 DIAGNOSIS — M25572 Pain in left ankle and joints of left foot: Secondary | ICD-10-CM | POA: Diagnosis not present

## 2023-01-02 DIAGNOSIS — M9904 Segmental and somatic dysfunction of sacral region: Secondary | ICD-10-CM | POA: Diagnosis not present

## 2023-01-02 DIAGNOSIS — M9903 Segmental and somatic dysfunction of lumbar region: Secondary | ICD-10-CM

## 2023-01-02 DIAGNOSIS — M9902 Segmental and somatic dysfunction of thoracic region: Secondary | ICD-10-CM

## 2023-01-02 DIAGNOSIS — M9908 Segmental and somatic dysfunction of rib cage: Secondary | ICD-10-CM | POA: Diagnosis not present

## 2023-01-02 NOTE — Patient Instructions (Signed)
Donna [name] Donna [name] See me again in 6-8 weeks

## 2023-01-02 NOTE — Assessment & Plan Note (Signed)
Describes positional pain even with sitting sometimes.  Could be potentially tarsal tunnel with patient having some overpronation of the hindfoot.  Discussed over-the-counter orthotics and see how patient responds first.  Follow-up with me again in 6 weeks and if necessary consider ultrasound.

## 2023-01-02 NOTE — Assessment & Plan Note (Signed)
Chronic, with mild exacerbation.  Did have the rotation of the sacrum noted again.  More tenderness on the left side but it is rotated right on right.  Discussed core strengthening given, which activities to do and which ones to avoid.  Increase activity slowly.  Follow-up with me again in 6 to 8 weeks.

## 2023-01-08 DIAGNOSIS — F4323 Adjustment disorder with mixed anxiety and depressed mood: Secondary | ICD-10-CM | POA: Diagnosis not present

## 2023-01-22 DIAGNOSIS — F4323 Adjustment disorder with mixed anxiety and depressed mood: Secondary | ICD-10-CM | POA: Diagnosis not present

## 2023-01-23 DIAGNOSIS — F4323 Adjustment disorder with mixed anxiety and depressed mood: Secondary | ICD-10-CM | POA: Diagnosis not present

## 2023-02-05 ENCOUNTER — Ambulatory Visit
Admission: EM | Admit: 2023-02-05 | Discharge: 2023-02-05 | Disposition: A | Discharge status: Home / Self Care | Payer: BC Managed Care – PPO | Attending: Internal Medicine | Admitting: Internal Medicine

## 2023-02-05 DIAGNOSIS — J069 Acute upper respiratory infection, unspecified: Secondary | ICD-10-CM | POA: Insufficient documentation

## 2023-02-05 DIAGNOSIS — R070 Pain in throat: Secondary | ICD-10-CM

## 2023-02-05 DIAGNOSIS — Z1152 Encounter for screening for COVID-19: Secondary | ICD-10-CM | POA: Diagnosis not present

## 2023-02-05 LAB — POCT RAPID STREP A (OFFICE): Rapid Strep A Screen: NEGATIVE

## 2023-02-05 MED ORDER — LEVOCETIRIZINE DIHYDROCHLORIDE 5 MG PO TABS: 5.0000 mg | ORAL_TABLET | Freq: Every evening | ORAL | 0 refills | Status: DC

## 2023-02-05 MED ORDER — PSEUDOEPHEDRINE HCL 60 MG PO TABS: 60.0000 mg | ORAL_TABLET | Freq: Three times a day (TID) | ORAL | 0 refills | Status: DC | PRN

## 2023-02-05 NOTE — ED Triage Notes (Signed)
Pt nasal congestion, scratchy throat, bilat ear pain, HA x 2 days-NAD-steady gait

## 2023-02-05 NOTE — Discharge Instructions (Addendum)
We will notify you of your test results as they arrive and may take between about 24 hours.  I encourage you to sign up for MyChart if you have not already done so as this can be the easiest way for [place] to communicate results to you online or through a phone app.  Generally, we only contact you if it is a positive test result.  In the meantime, if you develop worsening symptoms including fever, chest pain, shortness of breath despite our current treatment plan then please report to the emergency room as this may be a sign of worsening status from possible viral infection.  Otherwise, we will manage this as a viral syndrome. For sore throat or cough try using a honey-based tea. Use 3 teaspoons of honey with juice squeezed from half lemon. Place shaved pieces of ginger into 1/2-1 cup of water and warm over stove top. Then mix the ingredients and repeat every 4 hours as needed. Please take Tylenol 500mg -650mg  every 6 hours for aches and pains, fevers. Hydrate very well with at least 2 liters of water. Eat light meals such as soups to replenish electrolytes and soft fruits, veggies. Start an antihistamine like Xyzal (5mg  daily) for postnasal drainage, sinus congestion.  You can take this together with pseudoephedrine (Sudafed) at a dose of 60 mg 2-3 times a day as needed for the same kind of congestion.  Use cough medications as needed.

## 2023-02-05 NOTE — ED Provider Notes (Signed)
[facility]  Note:  This document was prepared using Systems analyst and may include unintentional dictation errors.  MRN: [id number] DOB: November 23, 1991  Subjective:   Donna [name] is a 32 y.o. female presenting for 2 day history of acute onset sinus congestion, sinus headaches, scratchy throat, bilateral ear fullness and pain.  Has had multiple sick contacts including strep, viral illnesses, sinus infection (her husband is currently being treated for this).  No cough, chest pain, shortness of breath or wheezing, body pains.  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  No current facility-administered medications for this encounter.  Current Outpatient Medications:    cholecalciferol (VITAMIN D3) 25 MCG (1000 UNIT) tablet, Take 1,000 Units by mouth daily., Disp: , Rfl:    ferrous sulfate 325 (65 FE) MG tablet, Take 325 mg by mouth daily with breakfast., Disp: , Rfl:    ibuprofen (ADVIL) 800 MG tablet, Take 1 tablet (800 mg total) by mouth 3 (three) times daily with meals as needed for headache, moderate pain or cramping., Disp: 60 tablet, Rfl: 1   Prenatal Vit-Fe Fumarate-FA (MULTIVITAMIN-PRENATAL) 27-0.8 MG TABS tablet, Take 1 tablet by mouth daily at 12 noon., Disp: , Rfl:    Tart Cherry 1200 MG CAPS, Take by mouth., Disp: , Rfl:    Turmeric 500 MG CAPS, Take by mouth., Disp: , Rfl:    Allergies  Allergen Reactions   Penicillin G Rash   Sulfamethoxazole Rash    Past Medical History:  Diagnosis Date   Acid reflux    Anemia    History of palpitations      Past Surgical History:  Procedure Laterality Date   DILATATION & CURETTAGE/HYSTEROSCOPY WITH MYOSURE N/A 03/19/2022   Procedure: DILATATION & CURETTAGE/HYSTEROSCOPY WITH MYOSURE;  Surgeon: Radene Gunning, MD;  Location: [facility];  Service: Gynecology;  Laterality: N/A;   UPPER GASTROINTESTINAL ENDOSCOPY  2019    Family  History  Problem Relation Age of Onset   Cancer Maternal Grandfather        Prostate   Diabetes Brother        type 1   Hypertension Neg Hx     Social History   Tobacco Use   Smoking status: Never   Smokeless tobacco: Never  Vaping Use   Vaping Use: Never used  Substance Use Topics   Alcohol use: Yes    Comment: occasionally   Drug use: Never    ROS   Objective:   Vitals: BP 133/80 (BP Location: Right Arm)   Pulse 85   Temp 98.6 F (37 C) (Oral)   Resp 20   LMP 01/30/2023   SpO2 99%   Physical Exam Constitutional:      General: She is not in acute distress.    Appearance: Normal appearance. She is well-developed and normal weight. She is not ill-appearing, toxic-appearing or diaphoretic.  HENT:     Head: Normocephalic and atraumatic.     Right Ear: Tympanic membrane, ear canal and external ear normal. No drainage or tenderness. No middle ear effusion. There is no impacted cerumen. Tympanic membrane is not erythematous or bulging.     Left Ear: Tympanic membrane, ear canal and external ear normal. No drainage or tenderness.  No middle ear effusion. There is no impacted cerumen. Tympanic membrane is not erythematous or bulging.     Nose: No congestion or rhinorrhea.     Mouth/Throat:  Mouth: Mucous membranes are moist. No oral lesions.     Pharynx: No pharyngeal swelling, oropharyngeal exudate, posterior oropharyngeal erythema or uvula swelling.     Tonsils: No tonsillar exudate or tonsillar abscesses.  Eyes:     General: No scleral icterus.       Right eye: No discharge.        Left eye: No discharge.     Extraocular Movements: Extraocular movements intact.     Right eye: Normal extraocular motion.     Left eye: Normal extraocular motion.     Conjunctiva/sclera: Conjunctivae normal.  Cardiovascular:     Rate and Rhythm: Normal rate.  Pulmonary:     Effort: Pulmonary effort is normal.  Musculoskeletal:     Cervical back: Normal range of motion and neck  supple.  Lymphadenopathy:     Cervical: No cervical adenopathy.  Skin:    General: Skin is warm and dry.  Neurological:     General: No focal deficit present.     Mental Status: She is alert and oriented to person, place, and time.  Psychiatric:        Mood and Affect: Mood normal.        Behavior: Behavior normal.     Results for orders placed or performed during the hospital encounter of 02/05/23 (from the past 24 hour(s))  POCT rapid strep A     Status: None   Collection Time: 02/05/23  6:46 PM  Result Value Ref Range   Rapid Strep A Screen Negative Negative    Assessment and Plan :   PDMP not reviewed this encounter.  1. Viral upper respiratory infection   2. Throat pain    Will manage for viral illness such as viral URI, viral syndrome, viral rhinitis, COVID-19, viral pharyngitis. Recommended supportive care. Offered scripts for symptomatic relief. COVID 19 and strep culture are pending. Counseled patient on potential for adverse effects with medications prescribed/recommended today, ER and return-to-clinic precautions discussed, patient verbalized understanding.   If patient remains symptomatic Monday-Tuesday of next week, I was agreeable to sending a script for bacterial sinusitis without an office visit as long as there is no chest pain or lower respiratory symptoms.   Jaynee Eagles, [place] 02/05/23 1950

## 2023-02-06 LAB — SARS CORONAVIRUS 2 (TAT 6-24 HRS): SARS Coronavirus 2: NEGATIVE

## 2023-02-08 LAB — CULTURE, GROUP A STREP (THRC)

## 2023-02-12 DIAGNOSIS — F4323 Adjustment disorder with mixed anxiety and depressed mood: Secondary | ICD-10-CM | POA: Diagnosis not present

## 2023-02-12 NOTE — Progress Notes (Unsigned)
[facility] [address] [place] Phone: [phone] Subjective:   IVilma Meckel, am serving as a scribe for Dr. Hulan Saas.  I'm seeing this patient by the request  of:  Marrian Salvage, FNP  CC: Back and neck pain follow-up  [id number]  Courtnie Munuz is a 32 y.o. female coming in with complaint of back and neck pain. OMT 01/02/2023. Patient states same per usual. No new concerns.  Continues to have some discomfort on a daily basis.  Not stopping her from doing too much but would like to be working on a little bit heavier.  Feels though that this does seem to exacerbate some of the underlying problems.  Still though likely not stopping her from any activity.  Medications patient has been prescribed: None  Taking:         Reviewed prior external information including notes and imaging from previsou exam, outside providers and external EMR if available.   As well as notes that were available from care everywhere and other healthcare systems.  Past medical history, social, surgical and family history all reviewed in electronic medical record.  No pertanent information unless stated regarding to the chief complaint.   Past Medical History:  Diagnosis Date   Acid reflux    Anemia    History of palpitations     Allergies  Allergen Reactions   Penicillin G Rash   Sulfamethoxazole Rash     Review of Systems:  No  visual changes, nausea, vomiting, diarrhea, constipation, dizziness, abdominal pain, skin rash, fevers, chills, night sweats, weight loss, swollen lymph nodes,  joint swelling, chest pain, shortness of breath, mood changes. POSITIVE muscle aches, body aches, headache  Objective  Blood pressure 120/76, pulse 82, height 5' 3"$  (1.6 m), weight 128 lb (58.1 kg), last menstrual period 01/30/2023, SpO2 99 %.   General: No apparent distress alert and oriented x3 mood and affect normal, dressed appropriately.   HEENT: Pupils equal, extraocular movements intact  Respiratory: Patient's speak in full sentences and does not appear short of breath  Cardiovascular: No lower extremity edema, non tender, no erythema  Back exam does have some loss of lordosis.  Some tenderness to palpation in the paraspinal musculature.  Patient does still have the hypermobility also noted in multiple different areas.  Osteopathic findings  C3 flexed rotated and side bent right C6 flexed rotated and side bent left T3 extended rotated and side bent right inhaled rib T8 extended rotated and side bent left L2 flexed rotated and side bent right Sacrum right on right       Assessment and Plan:  SI (sacroiliac) joint dysfunction Continues to some chronic difficulties noted.  Has had a lot of difficulties with patient doing other things around the house work.  Patient is trying to work out on a regular basis and we discussed lifting mechanics that could be beneficial.  Is responding well to osteopathic manipulation.  Could consider some other medications if necessary but at the moment we will continue to hold.  Follow-up again in 6 to 8 weeks    Nonallopathic problems  Decision today to treat with OMT was based on Physical Exam  After verbal consent patient was treated with HVLA, ME, FPR techniques in cervical, rib, thoracic, lumbar, and sacral  areas  Patient tolerated the procedure well with improvement in symptoms  Patient given exercises, stretches and lifestyle modifications  See medications in patient instructions if given  Patient will  follow up in 4-8 weeks    The above documentation has been reviewed and is accurate and complete Lyndal Pulley, DO          Note: This dictation was prepared with Dragon dictation along with smaller phrase technology. Any transcriptional errors that result from this process are unintentional.

## 2023-02-13 ENCOUNTER — Ambulatory Visit (INDEPENDENT_AMBULATORY_CARE_PROVIDER_SITE_OTHER): Payer: BC Managed Care – PPO | Admitting: Family Medicine

## 2023-02-13 VITALS — BP 120/76 | HR 82 | Ht 63.0 in | Wt 128.0 lb

## 2023-02-13 DIAGNOSIS — M9903 Segmental and somatic dysfunction of lumbar region: Secondary | ICD-10-CM

## 2023-02-13 DIAGNOSIS — M9908 Segmental and somatic dysfunction of rib cage: Secondary | ICD-10-CM | POA: Diagnosis not present

## 2023-02-13 DIAGNOSIS — F4323 Adjustment disorder with mixed anxiety and depressed mood: Secondary | ICD-10-CM | POA: Diagnosis not present

## 2023-02-13 DIAGNOSIS — M533 Sacrococcygeal disorders, not elsewhere classified: Secondary | ICD-10-CM

## 2023-02-13 DIAGNOSIS — M9902 Segmental and somatic dysfunction of thoracic region: Secondary | ICD-10-CM

## 2023-02-13 DIAGNOSIS — M9901 Segmental and somatic dysfunction of cervical region: Secondary | ICD-10-CM

## 2023-02-13 DIAGNOSIS — M9904 Segmental and somatic dysfunction of sacral region: Secondary | ICD-10-CM

## 2023-02-13 NOTE — Patient Instructions (Addendum)
Good to see you! Essential amino acids Eat within 30 mins of weight lifting Have a great cruise See me after cruise

## 2023-02-13 NOTE — Assessment & Plan Note (Signed)
Continues to some chronic difficulties noted.  Has had a lot of difficulties with patient doing other things around the house work.  Patient is trying to work out on a regular basis and we discussed lifting mechanics that could be beneficial.  Is responding well to osteopathic manipulation.  Could consider some other medications if necessary but at the moment we will continue to hold.  Follow-up again in 6 to 8 weeks

## 2023-02-26 DIAGNOSIS — F4323 Adjustment disorder with mixed anxiety and depressed mood: Secondary | ICD-10-CM | POA: Diagnosis not present

## 2023-03-06 DIAGNOSIS — F4323 Adjustment disorder with mixed anxiety and depressed mood: Secondary | ICD-10-CM | POA: Diagnosis not present

## 2023-03-07 ENCOUNTER — Ambulatory Visit (INDEPENDENT_AMBULATORY_CARE_PROVIDER_SITE_OTHER): Payer: BC Managed Care – PPO | Admitting: Obstetrics and Gynecology

## 2023-03-07 ENCOUNTER — Other Ambulatory Visit: Payer: Self-pay | Admitting: Obstetrics and Gynecology

## 2023-03-07 ENCOUNTER — Encounter: Payer: Self-pay | Admitting: Obstetrics and Gynecology

## 2023-03-07 VITALS — BP 113/69 | HR 81 | Ht 63.0 in | Wt 127.0 lb

## 2023-03-07 DIAGNOSIS — Z01419 Encounter for gynecological examination (general) (routine) without abnormal findings: Secondary | ICD-10-CM

## 2023-03-07 DIAGNOSIS — N63 Unspecified lump in unspecified breast: Secondary | ICD-10-CM | POA: Diagnosis not present

## 2023-03-07 DIAGNOSIS — Z0000 Encounter for general adult medical examination without abnormal findings: Secondary | ICD-10-CM

## 2023-03-07 NOTE — Progress Notes (Signed)
ANNUAL EXAM Patient name: Donna [name] MRN [id number]  Date of birth: 1991-02-05 Chief Complaint:   Annual Exam  History of Present Illness:   Donna [name] is a 32 y.o. G0P0000 being seen today for a routine annual exam.  Current complaints: breast change  Menstrual concerns? No   Breast or nipple changes? Yes skin discoloration and question of 'shelf' on right side - not sure, no nipple changes or nipple discharge, no FH of breast cancer Contraception use? Yes occasional condom or 'pull out', had been trying sort of trying to get pregnancy but not consistently, will try more earnestly once home buying process is completed Sexually active? Yes - no   Patient's last menstrual period was 02/26/2023 (exact date).   Last pap     Component Value Date/Time   DIAGPAP  11/02/2021 0929    - Negative for Intraepithelial Lesions or Malignancy (NILM)   DIAGPAP - Benign reactive/reparative changes 11/02/2021 0929   [place] Negative 11/02/2021 0929   ADEQPAP  11/02/2021 0929    Satisfactory for evaluation; transformation zone component PRESENT.     H/O abnormal pap: no Last mammogram: n/a Last colonoscopy: n/a.      02/01/2022    2:06 PM 11/02/2021    8:42 AM  Depression screen PHQ 2/9  Decreased Interest 1 1  Down, Depressed, Hopeless 0 1  PHQ - 2 Score 1 2  Altered sleeping  0  Tired, decreased energy  2  Change in appetite  0  Feeling bad or failure about yourself   1  Trouble concentrating  0  Moving slowly or fidgety/restless  0  Suicidal thoughts  0  PHQ-9 Score  5        11/02/2021    8:42 AM  GAD 7 : Generalized Anxiety Score  Nervous, Anxious, on Edge 3  Control/stop worrying 2  Worry too much - different things 2  Trouble relaxing 1  Restless 0  Easily annoyed or irritable 2  Afraid - awful might happen 2  Total GAD 7 Score 12  Anxiety Difficulty Somewhat difficult     Review of Systems:   Pertinent items are noted in HPI Denies any headaches, blurred  vision, fatigue, shortness of breath, chest pain, abdominal pain, abnormal vaginal discharge/itching/odor/irritation, problems with periods, bowel movements, urination, or intercourse unless otherwise stated above. Pertinent History Reviewed:  Reviewed past medical,surgical, social and family history.  Reviewed problem list, medications and allergies. Physical Assessment:   Vitals:   03/07/23 0900  BP: 113/69  Pulse: 81  Weight: 127 lb (57.6 kg)  Height: '5\' 3"'$  (1.6 m)  Body mass index is 22.5 kg/m.        Physical Examination:   General appearance - well appearing, and in no distress  Mental status - alert, oriented to person, place, and time  Psych:  She has a normal mood and affect  Skin - warm and dry, normal color, no suspicious lesions noted  Chest - effort normal, all lung fields clear to auscultation bilaterally  Heart - normal rate and regular rhythm  Breasts - breasts appear normal, small area of slightly hypergpigmentation on chest wall between lower breast, normal appearing nipples, no axillary nodes, right upper breast with irregular firmness that is nontender and without overlying skin changes  Abdomen - soft, nontender, nondistended, no masses or organomegaly  Pelvic - deferred  Extremities:  No swelling or varicosities noted  Chaperone present for exam  No results found for this or any previous visit (  from the past 24 hour(s)).    Assessment & Plan:  1. Well woman exam (no gynecological exam) Pap up to date All questions answered  2. Breast mass in female Breast irregularity palpated on exam, consistent with reported history. Breast imaging ordered to evaluate further.  - [place] LIMITED ULTRASOUND INCLUDING AXILLA RIGHT BREAST; Future    Orders Placed This Encounter  Procedures   [place] LIMITED ULTRASOUND INCLUDING AXILLA RIGHT BREAST    Meds: No orders of the defined types were placed in this encounter.   Follow-up: No follow-ups on file.  Darliss Cheney, MD 03/07/2023 9:06 AM

## 2023-03-12 DIAGNOSIS — F4323 Adjustment disorder with mixed anxiety and depressed mood: Secondary | ICD-10-CM | POA: Diagnosis not present

## 2023-03-17 ENCOUNTER — Other Ambulatory Visit: Payer: Self-pay | Admitting: Obstetrics and Gynecology

## 2023-03-17 ENCOUNTER — Ambulatory Visit
Admission: RE | Admit: 2023-03-17 | Discharge: 2023-03-17 | Disposition: A | Discharge status: Home / Self Care | Payer: BC Managed Care – PPO | Source: Ambulatory Visit | Attending: Obstetrics and Gynecology | Admitting: Obstetrics and Gynecology

## 2023-03-17 DIAGNOSIS — N6489 Other specified disorders of breast: Secondary | ICD-10-CM | POA: Diagnosis not present

## 2023-03-17 DIAGNOSIS — N63 Unspecified lump in unspecified breast: Secondary | ICD-10-CM

## 2023-03-17 DIAGNOSIS — R922 Inconclusive mammogram: Secondary | ICD-10-CM | POA: Diagnosis not present

## 2023-03-17 DIAGNOSIS — N6321 Unspecified lump in the left breast, upper outer quadrant: Secondary | ICD-10-CM | POA: Diagnosis not present

## 2023-03-26 DIAGNOSIS — F4323 Adjustment disorder with mixed anxiety and depressed mood: Secondary | ICD-10-CM | POA: Diagnosis not present

## 2023-04-09 DIAGNOSIS — F4323 Adjustment disorder with mixed anxiety and depressed mood: Secondary | ICD-10-CM | POA: Diagnosis not present

## 2023-04-16 ENCOUNTER — Ambulatory Visit: Payer: BC Managed Care – PPO | Admitting: Family Medicine

## 2023-04-23 DIAGNOSIS — F4323 Adjustment disorder with mixed anxiety and depressed mood: Secondary | ICD-10-CM | POA: Diagnosis not present

## 2023-04-25 ENCOUNTER — Ambulatory Visit
Admission: RE | Admit: 2023-04-25 | Discharge: 2023-04-25 | Disposition: A | Discharge status: Home / Self Care | Payer: BC Managed Care – PPO | Source: Ambulatory Visit | Attending: Obstetrics and Gynecology | Admitting: Obstetrics and Gynecology

## 2023-04-25 DIAGNOSIS — D242 Benign neoplasm of left breast: Secondary | ICD-10-CM | POA: Diagnosis not present

## 2023-04-25 DIAGNOSIS — N6321 Unspecified lump in the left breast, upper outer quadrant: Secondary | ICD-10-CM | POA: Diagnosis not present

## 2023-04-25 DIAGNOSIS — N63 Unspecified lump in unspecified breast: Secondary | ICD-10-CM

## 2023-04-25 HISTORY — PX: BREAST BIOPSY: SHX20

## 2023-05-05 NOTE — Progress Notes (Unsigned)
[name] [address] [place] [address] Phone: [phone] Subjective:   [name], am serving as a scribe for Dr. Antoine Primas.  I'm seeing this patient by the request  of:  Olive Bass, FNP  CC: Back and neck pain follow-up  [id number]  Donna [name] is a 32 y.o. female coming in with complaint of back and neck pain. OMT 02/13/2023. Patient states doing well. Would like to know if there are any other suggestions on how to manage aches and pains at home. No new concerns.  Medications patient has been prescribed: None  Taking:         Reviewed prior external information including notes and imaging from previsou exam, outside providers and external EMR if available.   As well as notes that were available from care everywhere and other healthcare systems.  Past medical history, social, surgical and family history all reviewed in electronic medical record.  No pertanent information unless stated regarding to the chief complaint.   Past Medical History:  Diagnosis Date   Acid reflux    Anemia    History of palpitations     Allergies  Allergen Reactions   Penicillin G Rash   Sulfamethoxazole Rash     Review of Systems:  No headache, visual changes, nausea, vomiting, diarrhea, constipation, dizziness, abdominal pain, skin rash, fevers, chills, night sweats, weight loss, swollen lymph nodes, body aches, joint swelling, chest pain, shortness of breath, mood changes. POSITIVE muscle aches  Objective  Blood pressure 118/74, pulse 78, height 5\' 3"  (1.6 m), weight 130 lb (59 kg), SpO2 97 %.   General: No apparent distress alert and oriented x3 mood and affect normal, dressed appropriately.  HEENT: Pupils equal, extraocular movements intact  Respiratory: Patient's speak in full sentences and does not appear short of breath  Cardiovascular: No lower extremity edema, non tender, no erythema   Hypermobility still  noted.  Some tenderness over the sacroiliac joint noted.  Some dull, throbbing aching discomfort as well.  Negative straight leg test noted.  Neck exam does have some limited sidebending bilaterally.  Osteopathic findings  C3 flexed rotated and side bent right C4 flexed rotated and side bent left T3 extended rotated and side bent left inhaled rib T8 extended rotated and side bent left L2 flexed rotated and side bent right Sacrum right on right       Assessment and Plan:  Thoracogenic scoliosis of thoracolumbar region Patient does have the thoracic region of the scoliosis of the thoracic and lumbar region.  The patient is finding well though to osteopathic manipulation.  Does have the sacroiliac joint dysfunction we will continue to monitor as well.  Discussed posture and balance.  Discussed which activities to do and which ones to avoid.  Follow-up with me again in 6 to 8 weeks.    Nonallopathic problems  Decision today to treat with OMT was based on Physical Exam  After verbal consent patient was treated with HVLA, ME, FPR techniques in cervical, rib, thoracic, lumbar, and sacral  areas  Patient tolerated the procedure well with improvement in symptoms  Patient given exercises, stretches and lifestyle modifications  See medications in patient instructions if given  Patient will follow up in 4-8 weeks      The above documentation has been reviewed and is accurate and complete Judi Saa, DO        Note: This dictation was prepared with Dragon dictation along with smaller phrase  technology. Any transcriptional errors that result from this process are unintentional.

## 2023-05-06 ENCOUNTER — Ambulatory Visit: Payer: BC Managed Care – PPO | Admitting: Family Medicine

## 2023-05-06 ENCOUNTER — Encounter: Payer: Self-pay | Admitting: Family Medicine

## 2023-05-06 VITALS — BP 118/74 | HR 78 | Ht 63.0 in | Wt 130.0 lb

## 2023-05-06 DIAGNOSIS — M9903 Segmental and somatic dysfunction of lumbar region: Secondary | ICD-10-CM

## 2023-05-06 DIAGNOSIS — M9904 Segmental and somatic dysfunction of sacral region: Secondary | ICD-10-CM

## 2023-05-06 DIAGNOSIS — M9902 Segmental and somatic dysfunction of thoracic region: Secondary | ICD-10-CM

## 2023-05-06 DIAGNOSIS — M9908 Segmental and somatic dysfunction of rib cage: Secondary | ICD-10-CM | POA: Diagnosis not present

## 2023-05-06 DIAGNOSIS — M9901 Segmental and somatic dysfunction of cervical region: Secondary | ICD-10-CM | POA: Diagnosis not present

## 2023-05-06 DIAGNOSIS — M4135 Thoracogenic scoliosis, thoracolumbar region: Secondary | ICD-10-CM

## 2023-05-06 NOTE — Patient Instructions (Signed)
Fabric resistance bands Hip abductors [employer] fencing See you again in 6 weeks

## 2023-05-06 NOTE — Assessment & Plan Note (Signed)
Patient does have the thoracic region of the scoliosis of the thoracic and lumbar region.  The patient is finding well though to osteopathic manipulation.  Does have the sacroiliac joint dysfunction we will continue to monitor as well.  Discussed posture and balance.  Discussed which activities to do and which ones to avoid.  Follow-up with me again in 6 to 8 weeks.

## 2023-05-07 DIAGNOSIS — F4323 Adjustment disorder with mixed anxiety and depressed mood: Secondary | ICD-10-CM | POA: Diagnosis not present

## 2023-05-14 DIAGNOSIS — F4322 Adjustment disorder with anxiety: Secondary | ICD-10-CM | POA: Diagnosis not present

## 2023-05-15 DIAGNOSIS — F4322 Adjustment disorder with anxiety: Secondary | ICD-10-CM | POA: Diagnosis not present

## 2023-05-21 DIAGNOSIS — F4322 Adjustment disorder with anxiety: Secondary | ICD-10-CM | POA: Diagnosis not present

## 2023-05-29 DIAGNOSIS — F4322 Adjustment disorder with anxiety: Secondary | ICD-10-CM | POA: Diagnosis not present

## 2023-06-04 DIAGNOSIS — F4322 Adjustment disorder with anxiety: Secondary | ICD-10-CM | POA: Diagnosis not present

## 2023-06-06 DIAGNOSIS — H31093 Other chorioretinal scars, bilateral: Secondary | ICD-10-CM | POA: Diagnosis not present

## 2023-06-06 DIAGNOSIS — H35413 Lattice degeneration of retina, bilateral: Secondary | ICD-10-CM | POA: Diagnosis not present

## 2023-06-12 DIAGNOSIS — F4323 Adjustment disorder with mixed anxiety and depressed mood: Secondary | ICD-10-CM | POA: Diagnosis not present

## 2023-06-16 NOTE — Progress Notes (Signed)
[name] [address] [place] [address] Phone: [phone] Subjective:    I'm seeing this patient by the request  of:  Olive Bass, FNP  CC: Neck and low back pain follow-up  [id number]  Donna [name] is a 32 y.o. female coming in with complaint of back and neck pain. OMT 05/06/2023. Patient states shoulder and low back tightness .  Patient has been doing moving.  Also getting rid of popcorn ceilings.  This is caused her to have more tightness noted.  Discussed which activities to do and which ones to avoid.  Patient has not been able to do any of the exercises recently.  This is more secondary to time.  Medications patient has been prescribed: None  Taking:         Reviewed prior external information including notes and imaging from previsou exam, outside providers and external EMR if available.   As well as notes that were available from care everywhere and other healthcare systems.  Past medical history, social, surgical and family history all reviewed in electronic medical record.  No pertanent information unless stated regarding to the chief complaint.   Past Medical History:  Diagnosis Date   Acid reflux    Anemia    History of palpitations     Allergies  Allergen Reactions   Penicillin G Rash   Sulfamethoxazole Rash     Review of Systems:  No headache, visual changes, nausea, vomiting, diarrhea, constipation, dizziness, abdominal pain, skin rash, fevers, chills, night sweats, weight loss, swollen lymph nodes, body aches, joint swelling, chest pain, shortness of breath, mood changes. POSITIVE muscle aches  Objective  Blood pressure 118/80, pulse 68, height 5\' 3"  (1.6 m), weight 129 lb (58.5 kg), SpO2 99 %.   General: No apparent distress alert and oriented x3 mood and affect normal, dressed appropriately.  HEENT: Pupils equal, extraocular movements intact  Respiratory: Patient's speak in full sentences and  does not appear short of breath  Cardiovascular: No lower extremity edema, non tender, no erythema  Neck exam does have some mild loss of lordosis.  The mild scoliosis is noted in the upper back.  Significant tightness around the shoulder girdle bilaterally.  Does have some scapular dyskinesis noted left greater than right.  Limited sidebending of the neck on the left.  Osteopathic findings  C2 flexed rotated and side bent right C6 flexed rotated and side bent left T3 extended rotated and side bent left inhaled rib T7 extended rotated and side bent left inhaled rib L2 flexed rotated and side bent right Sacrum right on right       Assessment and Plan:  Thoracogenic scoliosis of thoracolumbar region Patient has been doing a lot of repetitive activity especially overhead that has caused some more tightness and discomfort.  Discussed with patient to continue to get back into her routine on regular working out that I think will be more beneficial.  Discussed home exercises and icing regimen.  Follow-up with me again in 6 to 8 weeks.    Nonallopathic problems  Decision today to treat with OMT was based on Physical Exam  After verbal consent patient was treated with HVLA, ME, FPR techniques in cervical, rib, thoracic, lumbar, and sacral  areas  Patient tolerated the procedure well with improvement in symptoms  Patient given exercises, stretches and lifestyle modifications  See medications in patient instructions if given  Patient will follow up in 4-8 weeks      The  above documentation has been reviewed and is accurate and complete Donna Saa, DO        Note: This dictation was prepared with Dragon dictation along with smaller phrase technology. Any transcriptional errors that result from this process are unintentional.

## 2023-06-17 DIAGNOSIS — F4323 Adjustment disorder with mixed anxiety and depressed mood: Secondary | ICD-10-CM | POA: Diagnosis not present

## 2023-06-18 DIAGNOSIS — F4322 Adjustment disorder with anxiety: Secondary | ICD-10-CM | POA: Diagnosis not present

## 2023-06-19 DIAGNOSIS — F4323 Adjustment disorder with mixed anxiety and depressed mood: Secondary | ICD-10-CM | POA: Diagnosis not present

## 2023-06-23 ENCOUNTER — Ambulatory Visit: Payer: BC Managed Care – PPO | Admitting: Family Medicine

## 2023-06-23 ENCOUNTER — Encounter: Payer: Self-pay | Admitting: Family Medicine

## 2023-06-23 VITALS — BP 118/80 | HR 68 | Ht 63.0 in | Wt 129.0 lb

## 2023-06-23 DIAGNOSIS — M9904 Segmental and somatic dysfunction of sacral region: Secondary | ICD-10-CM | POA: Diagnosis not present

## 2023-06-23 DIAGNOSIS — M9903 Segmental and somatic dysfunction of lumbar region: Secondary | ICD-10-CM

## 2023-06-23 DIAGNOSIS — M9908 Segmental and somatic dysfunction of rib cage: Secondary | ICD-10-CM | POA: Diagnosis not present

## 2023-06-23 DIAGNOSIS — M9902 Segmental and somatic dysfunction of thoracic region: Secondary | ICD-10-CM

## 2023-06-23 DIAGNOSIS — M4135 Thoracogenic scoliosis, thoracolumbar region: Secondary | ICD-10-CM

## 2023-06-23 DIAGNOSIS — M9901 Segmental and somatic dysfunction of cervical region: Secondary | ICD-10-CM | POA: Diagnosis not present

## 2023-06-23 NOTE — Patient Instructions (Signed)
Have fun at the game  6-8 week follow up

## 2023-06-23 NOTE — Assessment & Plan Note (Signed)
Patient has been doing a lot of repetitive activity especially overhead that has caused some more tightness and discomfort.  Discussed with patient to continue to get back into her routine on regular working out that I think will be more beneficial.  Discussed home exercises and icing regimen.  Follow-up with me again in 6 to 8 weeks.

## 2023-07-02 DIAGNOSIS — F4322 Adjustment disorder with anxiety: Secondary | ICD-10-CM | POA: Diagnosis not present

## 2023-07-04 DIAGNOSIS — F4323 Adjustment disorder with mixed anxiety and depressed mood: Secondary | ICD-10-CM | POA: Diagnosis not present

## 2023-07-15 DIAGNOSIS — F4323 Adjustment disorder with mixed anxiety and depressed mood: Secondary | ICD-10-CM | POA: Diagnosis not present

## 2023-07-16 DIAGNOSIS — F4322 Adjustment disorder with anxiety: Secondary | ICD-10-CM | POA: Diagnosis not present

## 2023-07-17 DIAGNOSIS — F4323 Adjustment disorder with mixed anxiety and depressed mood: Secondary | ICD-10-CM | POA: Diagnosis not present

## 2023-07-24 DIAGNOSIS — F4323 Adjustment disorder with mixed anxiety and depressed mood: Secondary | ICD-10-CM | POA: Diagnosis not present

## 2023-07-30 DIAGNOSIS — F4322 Adjustment disorder with anxiety: Secondary | ICD-10-CM | POA: Diagnosis not present

## 2023-07-30 NOTE — Progress Notes (Unsigned)
  [name] [address] [place] [address] Phone: [phone] Subjective:   [name], am serving as a scribe for Dr. Antoine Primas.  I'm seeing this patient by the request  of:  Olive Bass, FNP  CC: back and neck pain   [id number]  Donna [name] is a 32 y.o. female coming in with complaint of back and neck pain. OMT 06/03/2023. Patient states same per usual. No new concerns.  Medications patient has been prescribed: None  Taking:         Reviewed prior external information including notes and imaging from previsou exam, outside providers and external EMR if available.   As well as notes that were available from care everywhere and other healthcare systems.  Past medical history, social, surgical and family history all reviewed in electronic medical record.  No pertanent information unless stated regarding to the chief complaint.   Past Medical History:  Diagnosis Date   Acid reflux    Anemia    History of palpitations     Allergies  Allergen Reactions   Penicillin G Rash   Sulfamethoxazole Rash     Review of Systems:  No headache, visual changes, nausea, vomiting, diarrhea, constipation, dizziness, abdominal pain, skin rash, fevers, chills, night sweats, weight loss, swollen lymph nodes, body aches, joint swelling, chest pain, shortness of breath, mood changes. POSITIVE muscle aches  Objective  Blood pressure 108/70, pulse 87, height 5\' 3"  (1.6 m), weight 128 lb (58.1 kg), SpO2 99%.   General: No apparent distress alert and oriented x3 mood and affect normal, dressed appropriately.  HEENT: Pupils equal, extraocular movements intact  Respiratory: Patient's speak in full sentences and does not appear short of breath  Cardiovascular: No lower extremity edema, non tender, no erythema  Gait MSK:  Back does have some scoliosis noted.  Some tenderness to palpation of the paraspinal musculature.  Osteopathic  findings  C3 flexed rotated and side bent right C6 flexed rotated and side bent left T3 extended rotated and side bent right inhaled rib T8 extended rotated and side bent left L2 flexed rotated and side bent right Sacrum right on right       Assessment and Plan:  SI (sacroiliac) joint dysfunction Chronic problem is stable.  He does have the scoliosis noted.  Discussed icing regimen and home exercises, does respond well to osteopathic manipulation.  Is working out on a more regular basis and discussed keeping hands within peripheral vision with patient having some increasing in left shoulder pain.  Follow-up with me again at 6 to 8-week interval    Nonallopathic problems  Decision today to treat with OMT was based on Physical Exam  After verbal consent patient was treated with HVLA, ME, FPR techniques in cervical, rib, thoracic, lumbar, and sacral  areas  Patient tolerated the procedure well with improvement in symptoms  Patient given exercises, stretches and lifestyle modifications  See medications in patient instructions if given  Patient will follow up in 4-8 weeks     The above documentation has been reviewed and is accurate and complete Judi Saa, DO         Note: This dictation was prepared with Dragon dictation along with smaller phrase technology. Any transcriptional errors that result from this process are unintentional.

## 2023-08-05 ENCOUNTER — Encounter: Payer: Self-pay | Admitting: Family Medicine

## 2023-08-05 ENCOUNTER — Ambulatory Visit: Payer: BC Managed Care – PPO | Admitting: Family Medicine

## 2023-08-05 VITALS — BP 108/70 | HR 87 | Ht 63.0 in | Wt 128.0 lb

## 2023-08-05 DIAGNOSIS — M9901 Segmental and somatic dysfunction of cervical region: Secondary | ICD-10-CM

## 2023-08-05 DIAGNOSIS — M533 Sacrococcygeal disorders, not elsewhere classified: Secondary | ICD-10-CM

## 2023-08-05 DIAGNOSIS — M9903 Segmental and somatic dysfunction of lumbar region: Secondary | ICD-10-CM | POA: Diagnosis not present

## 2023-08-05 DIAGNOSIS — M9902 Segmental and somatic dysfunction of thoracic region: Secondary | ICD-10-CM | POA: Diagnosis not present

## 2023-08-05 DIAGNOSIS — M9908 Segmental and somatic dysfunction of rib cage: Secondary | ICD-10-CM | POA: Diagnosis not present

## 2023-08-05 DIAGNOSIS — M9904 Segmental and somatic dysfunction of sacral region: Secondary | ICD-10-CM

## 2023-08-05 NOTE — Assessment & Plan Note (Signed)
Chronic problem is stable.  He does have the scoliosis noted.  Discussed icing regimen and home exercises, does respond well to osteopathic manipulation.  Is working out on a more regular basis and discussed keeping hands within peripheral vision with patient having some increasing in left shoulder pain.  Follow-up with me again at 6 to 8-week interval

## 2023-08-05 NOTE — Patient Instructions (Signed)
You are awesome  Keep hands within peripheral vision  Keep doing your thang and happy birthday! See me again in 6-8 weeks

## 2023-08-07 ENCOUNTER — Ambulatory Visit: Payer: BC Managed Care – PPO | Admitting: Family Medicine

## 2023-08-07 DIAGNOSIS — F4323 Adjustment disorder with mixed anxiety and depressed mood: Secondary | ICD-10-CM | POA: Diagnosis not present

## 2023-08-12 DIAGNOSIS — F4322 Adjustment disorder with anxiety: Secondary | ICD-10-CM | POA: Diagnosis not present

## 2023-08-13 DIAGNOSIS — F4322 Adjustment disorder with anxiety: Secondary | ICD-10-CM | POA: Diagnosis not present

## 2023-08-21 DIAGNOSIS — F4323 Adjustment disorder with mixed anxiety and depressed mood: Secondary | ICD-10-CM | POA: Diagnosis not present

## 2023-08-27 DIAGNOSIS — F4322 Adjustment disorder with anxiety: Secondary | ICD-10-CM | POA: Diagnosis not present

## 2023-09-03 DIAGNOSIS — F4323 Adjustment disorder with mixed anxiety and depressed mood: Secondary | ICD-10-CM | POA: Diagnosis not present

## 2023-09-10 DIAGNOSIS — F4322 Adjustment disorder with anxiety: Secondary | ICD-10-CM | POA: Diagnosis not present

## 2023-09-17 ENCOUNTER — Ambulatory Visit: Payer: BC Managed Care – PPO | Admitting: Family Medicine

## 2023-09-17 DIAGNOSIS — F4323 Adjustment disorder with mixed anxiety and depressed mood: Secondary | ICD-10-CM | POA: Diagnosis not present

## 2023-09-21 IMAGING — US US PELVIS COMPLETE WITH TRANSVAGINAL
1 series · 14 of 25 positions shown · non-contrast
Comparison: None

CLINICAL DATA: Irregular bleeding, postcoital spotting

EXAM:
TRANSABDOMINAL AND TRANSVAGINAL ULTRASOUND OF PELVIS
TECHNIQUE: Both transabdominal and transvaginal ultrasound examinations of the
pelvis were performed. Transabdominal technique was performed for
global imaging of the pelvis including uterus, ovaries, adnexal
regions, and pelvic cul-de-sac.

[Series 1: us pelvis complete with transvaginal · 14 of 118 slices shown]
[im 1/118]
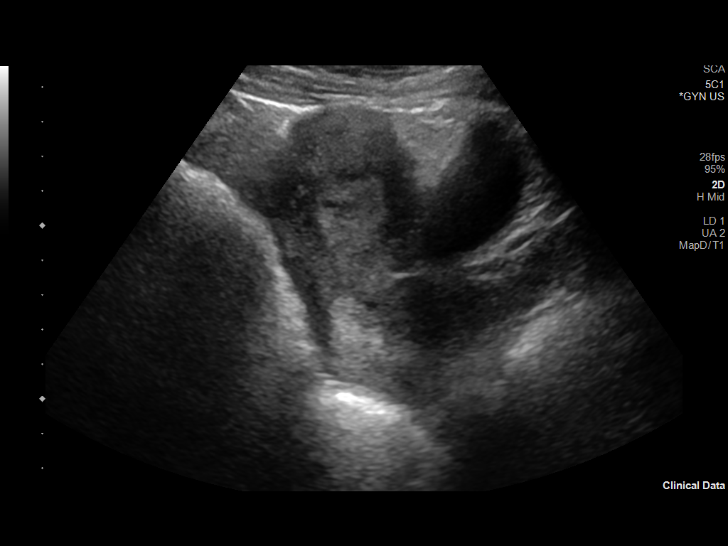
[im 10/118]
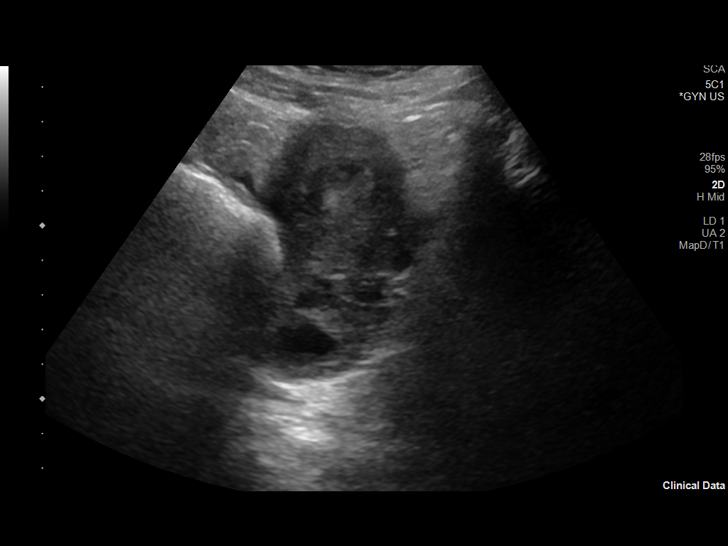
[im 20/118]
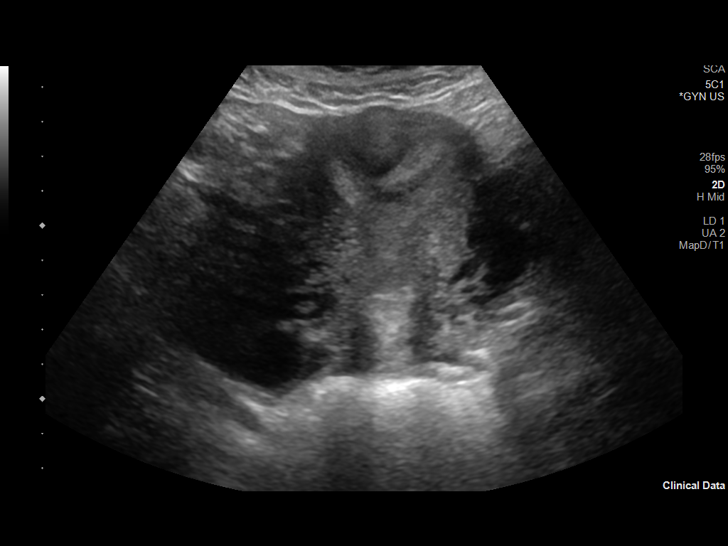
[im 30/118]
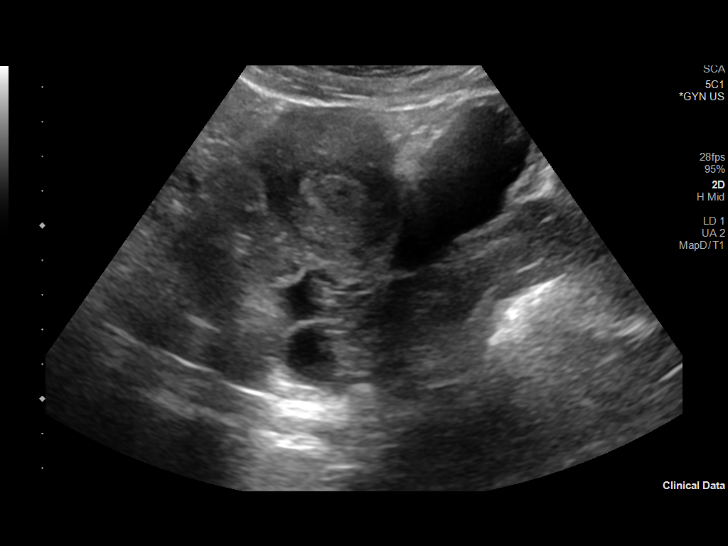
[im 40/118]
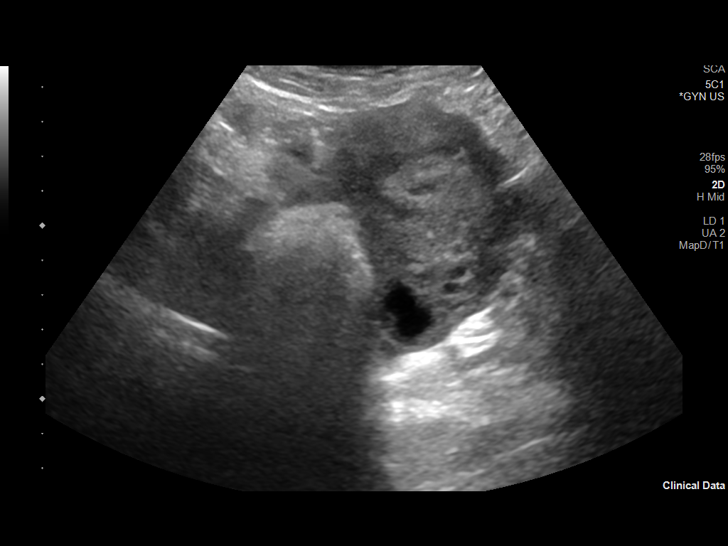
[im 44/118]
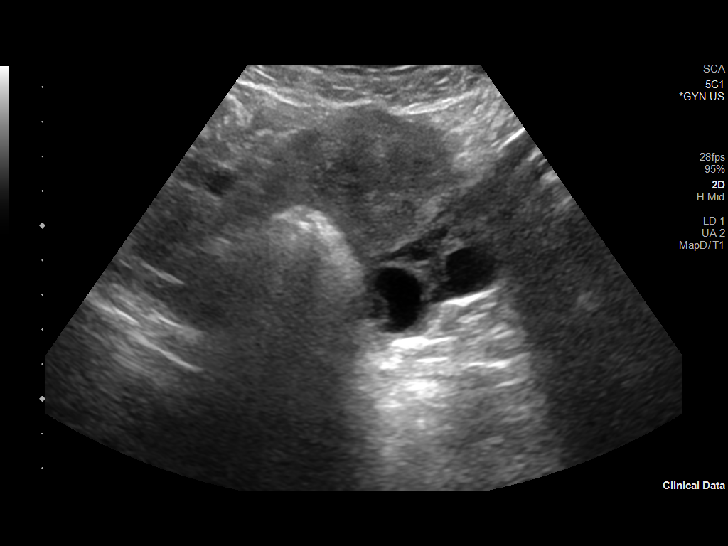
[im 54/118]
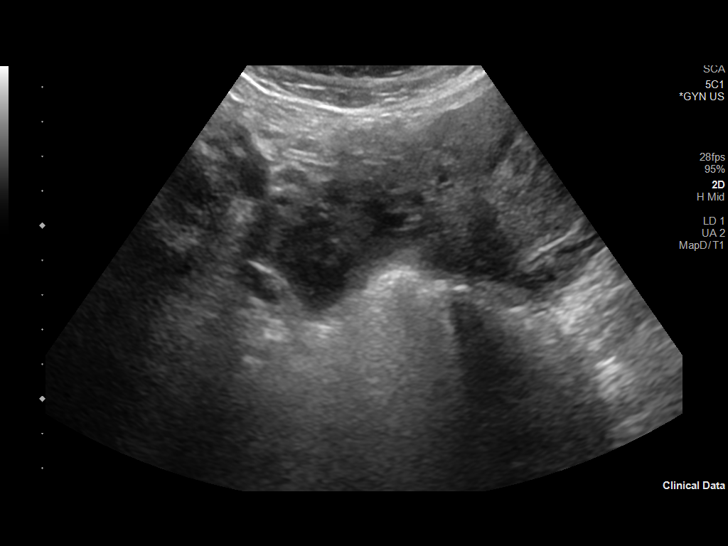
[im 64/118]
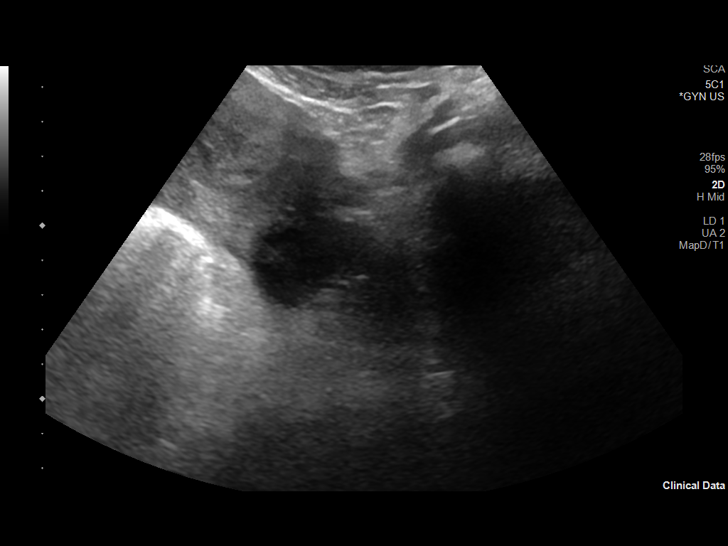
[im 74/118]
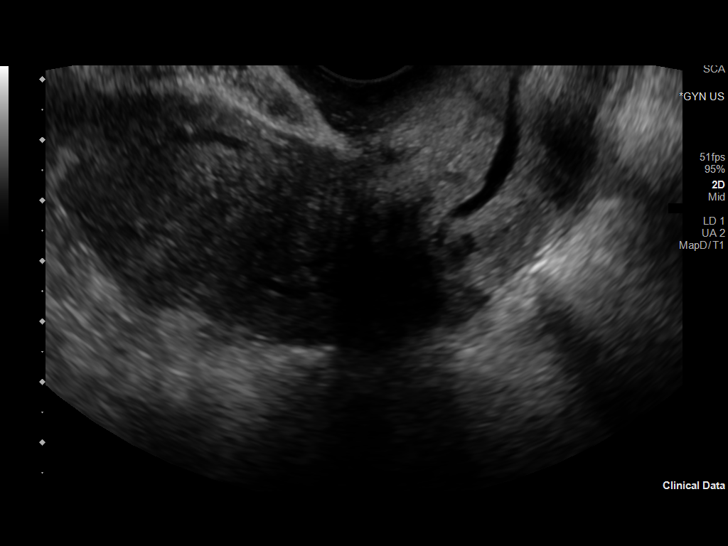
[im 79/118]
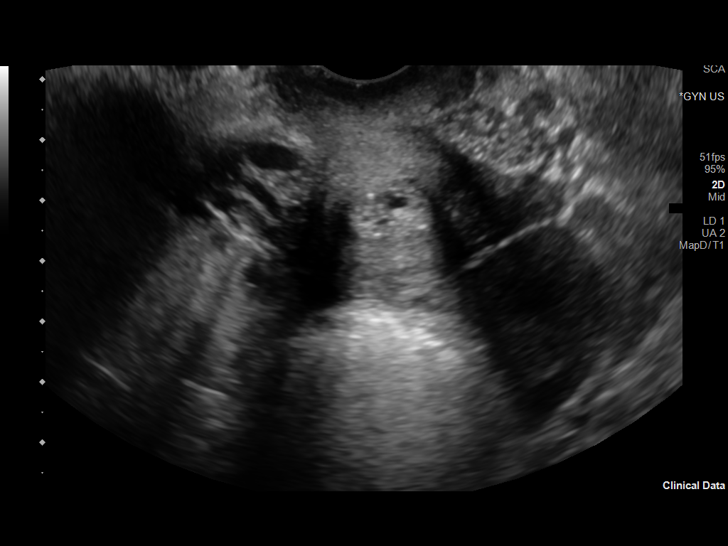
[im 88/118]
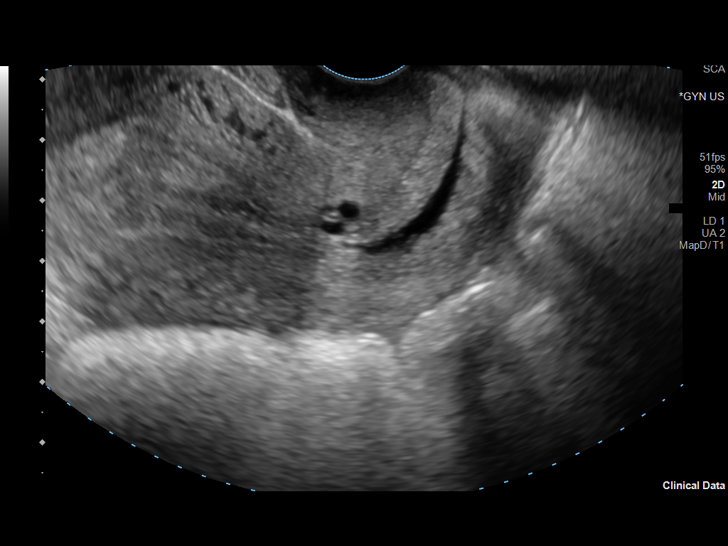
[im 98/118]
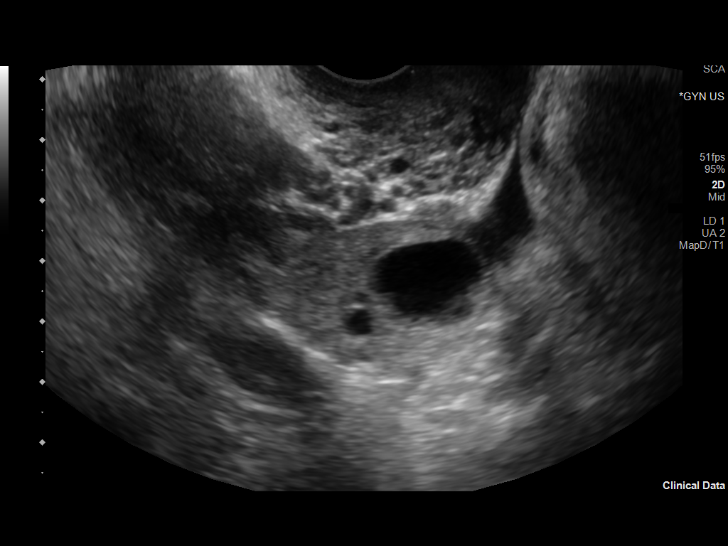
[im 108/118]
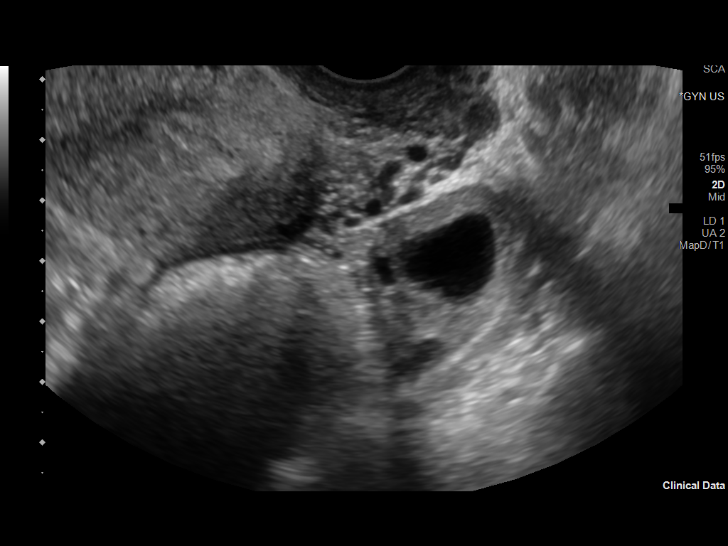
[im 118/118]
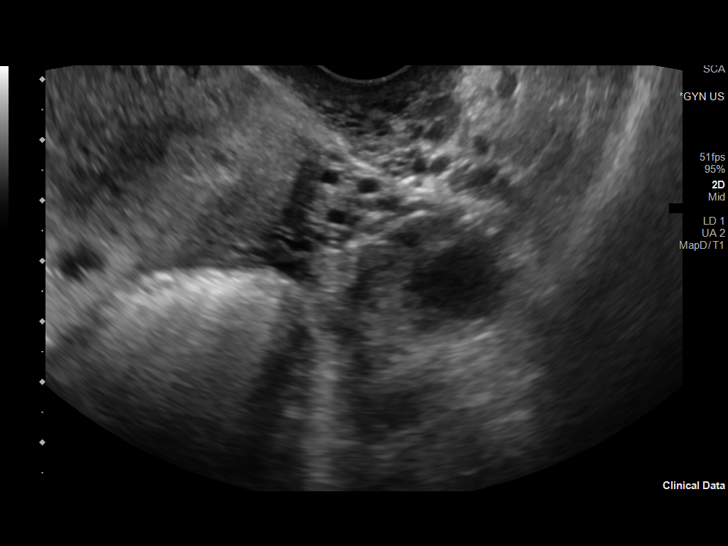

[14 of 25 positions shown; findings below may reference images not displayed]

FINDINGS: Uterus

Measurements: 8.3 x 4.5 x 6 cm = volume: 118.1 mL. No fibroids or
other mass visualized.

Endometrium

Thickness: 17.5 mm. Small amount of fluid is seen in the cervical
canal.

Right ovary

Measurements: 4.2 x 2.6 x 3 cm = volume: 17.7 mL. Small follicles
are seen.

Left ovary

Measurements: 4.3 x 2.3 x 2.9 cm = volume: 15 mL. Small follicles
are seen.

Other findings

Trace amount of free fluid is seen.
IMPRESSION: Endometrial stripe is prominent, possibly suggesting secretory phase
of menstrual cycle. Small amount of fluid is seen in the cervical
canal, possibly blood. Small follicles are seen in the ovaries.
Trace amount of free fluid in the pelvis may be physiological.
Pelvic sonogram is otherwise unremarkable.

## 2023-09-24 DIAGNOSIS — F4322 Adjustment disorder with anxiety: Secondary | ICD-10-CM | POA: Diagnosis not present

## 2023-10-01 DIAGNOSIS — F4323 Adjustment disorder with mixed anxiety and depressed mood: Secondary | ICD-10-CM | POA: Diagnosis not present

## 2023-10-06 NOTE — Progress Notes (Unsigned)
[name] [address] [place] [address] Phone: [phone] Subjective:   Donna [name], am serving as a scribe for Dr. Antoine Primas.  I'm seeing this patient by the request  of:  Olive Bass, FNP  CC: Back and neck pain follow-up  [id number]  Donna [name] is a 32 y.o. female coming in with complaint of back and neck pain. OMT 08/05/2023.  Patient states same per usual.  L leg when squatting sometimes goes numb. Numbness goes away after she extends her leg. Numbness will happen with the L arm as well sometimes.  Medications patient has been prescribed: None  Taking:         Reviewed prior external information including notes and imaging from previsou exam, outside providers and external EMR if available.   As well as notes that were available from care everywhere and other healthcare systems.  Past medical history, social, surgical and family history all reviewed in electronic medical record.  No pertanent information unless stated regarding to the chief complaint.   Past Medical History:  Diagnosis Date   Acid reflux    Anemia    History of palpitations     Allergies  Allergen Reactions   Penicillin G Rash   Sulfamethoxazole Rash     Review of Systems:  No headache, visual changes, nausea, vomiting, diarrhea, constipation, dizziness, abdominal pain, skin rash, fevers, chills, night sweats, weight loss, swollen lymph nodes, body aches, joint swelling, chest pain, shortness of breath, mood changes. POSITIVE muscle aches  Objective  Blood pressure 116/80, pulse 82, height 5\' 3"  (1.6 m), weight 128 lb (58.1 kg), SpO2 97%.   General: No apparent distress alert and oriented x3 mood and affect normal, dressed appropriately.  HEENT: Pupils equal, extraocular movements intact  Respiratory: Patient's speak in full sentences and does not appear short of breath  Cardiovascular: No lower extremity edema, non tender, no  erythema  Low back does have some mild loss of lordosis and actually tightness more noted over the left SI joint.  Does have some anterior ilium rotation noted as well.  Negative straight leg test noted.  Osteopathic findings  C6 flexed rotated and side bent left T3 extended rotated and side bent right inhaled rib T9 extended rotated and side bent left L2 flexed rotated and side bent right Sacrum left on left    Assessment and Plan:  Thoracogenic scoliosis of thoracolumbar region Patient does have some scoliosis noted.  Discussed icing regimen and home exercises still.  We will continue to monitor the intermittent numbness noted.  Discussed home exercise and icing regimen, increase activity slowly.  Patient will monitor for any type of the numbness that was noted previously.  Follow-up again in 6 to 8 weeks    Nonallopathic problems  Decision today to treat with OMT was based on Physical Exam  After verbal consent patient was treated with HVLA, ME, FPR techniques in cervical, rib, thoracic, lumbar, and sacral  areas  Patient tolerated the procedure well with improvement in symptoms  Patient given exercises, stretches and lifestyle modifications  See medications in patient instructions if given  Patient will follow up in 4-8 weeks     The above documentation has been reviewed and is accurate and complete Judi Saa, DO         Note: This dictation was prepared with Dragon dictation along with smaller phrase technology. Any transcriptional errors that result from this process are unintentional.

## 2023-10-07 ENCOUNTER — Ambulatory Visit: Payer: BC Managed Care – PPO | Admitting: Family Medicine

## 2023-10-07 ENCOUNTER — Encounter: Payer: Self-pay | Admitting: Family Medicine

## 2023-10-07 VITALS — BP 116/80 | HR 82 | Ht 63.0 in | Wt 128.0 lb

## 2023-10-07 DIAGNOSIS — M9901 Segmental and somatic dysfunction of cervical region: Secondary | ICD-10-CM

## 2023-10-07 DIAGNOSIS — M9904 Segmental and somatic dysfunction of sacral region: Secondary | ICD-10-CM | POA: Diagnosis not present

## 2023-10-07 DIAGNOSIS — M9908 Segmental and somatic dysfunction of rib cage: Secondary | ICD-10-CM | POA: Diagnosis not present

## 2023-10-07 DIAGNOSIS — M4135 Thoracogenic scoliosis, thoracolumbar region: Secondary | ICD-10-CM

## 2023-10-07 DIAGNOSIS — M9902 Segmental and somatic dysfunction of thoracic region: Secondary | ICD-10-CM | POA: Diagnosis not present

## 2023-10-07 DIAGNOSIS — M9903 Segmental and somatic dysfunction of lumbar region: Secondary | ICD-10-CM

## 2023-10-07 NOTE — Patient Instructions (Signed)
Good to see you Keep monitoring symptoms See me again in 6-8 weeks

## 2023-10-07 NOTE — Assessment & Plan Note (Signed)
Patient does have some scoliosis noted.  Discussed icing regimen and home exercises still.  We will continue to monitor the intermittent numbness noted.  Discussed home exercise and icing regimen, increase activity slowly.  Patient will monitor for any type of the numbness that was noted previously.  Follow-up again in 6 to 8 weeks

## 2023-10-08 DIAGNOSIS — F4322 Adjustment disorder with anxiety: Secondary | ICD-10-CM | POA: Diagnosis not present

## 2023-10-15 DIAGNOSIS — F4323 Adjustment disorder with mixed anxiety and depressed mood: Secondary | ICD-10-CM | POA: Diagnosis not present

## 2023-10-22 DIAGNOSIS — F4322 Adjustment disorder with anxiety: Secondary | ICD-10-CM | POA: Diagnosis not present

## 2023-10-29 DIAGNOSIS — F4323 Adjustment disorder with mixed anxiety and depressed mood: Secondary | ICD-10-CM | POA: Diagnosis not present

## 2023-11-05 DIAGNOSIS — F4322 Adjustment disorder with anxiety: Secondary | ICD-10-CM | POA: Diagnosis not present

## 2023-11-17 NOTE — Progress Notes (Deleted)
  [name] [address] [place] [address] Phone: [phone] Subjective:    I'm seeing this patient by the request  of:  Olive Bass, FNP  CC:   [id number]  Donna [name] is a 32 y.o. female coming in with complaint of back and neck pain. OMT on 10/17/2023. Patient states   Medications patient has been prescribed:   Taking:         Reviewed prior external information including notes and imaging from previsou exam, outside providers and external EMR if available.   As well as notes that were available from care everywhere and other healthcare systems.  Past medical history, social, surgical and family history all reviewed in electronic medical record.  No pertanent information unless stated regarding to the chief complaint.   Past Medical History:  Diagnosis Date   Acid reflux    Anemia    History of palpitations     Allergies  Allergen Reactions   Penicillin G Rash   Sulfamethoxazole Rash     Review of Systems:  No headache, visual changes, nausea, vomiting, diarrhea, constipation, dizziness, abdominal pain, skin rash, fevers, chills, night sweats, weight loss, swollen lymph nodes, body aches, joint swelling, chest pain, shortness of breath, mood changes. POSITIVE muscle aches  Objective  There were no vitals taken for this visit.   General: No apparent distress alert and oriented x3 mood and affect normal, dressed appropriately.  HEENT: Pupils equal, extraocular movements intact  Respiratory: Patient's speak in full sentences and does not appear short of breath  Cardiovascular: No lower extremity edema, non tender, no erythema  Gait MSK:  Back   Osteopathic findings  C2 flexed rotated and side bent right C6 flexed rotated and side bent left T3 extended rotated and side bent right inhaled rib T9 extended rotated and side bent left L2 flexed rotated and side bent right Sacrum right on right        Assessment and Plan:  No problem-specific Assessment & Plan notes found for this encounter.    Nonallopathic problems  Decision today to treat with OMT was based on Physical Exam  After verbal consent patient was treated with HVLA, ME, FPR techniques in cervical, rib, thoracic, lumbar, and sacral  areas  Patient tolerated the procedure well with improvement in symptoms  Patient given exercises, stretches and lifestyle modifications  See medications in patient instructions if given  Patient will follow up in 4-8 weeks             Note: This dictation was prepared with Dragon dictation along with smaller phrase technology. Any transcriptional errors that result from this process are unintentional.

## 2023-11-18 ENCOUNTER — Ambulatory Visit: Payer: BC Managed Care – PPO | Admitting: Family Medicine

## 2023-11-18 NOTE — Progress Notes (Unsigned)
[name] [address] [place] [address] Phone: [phone] Subjective:   [name], am serving as a scribe for Dr. Antoine Primas.  I'm seeing this patient by the request  of:  Olive Bass, FNP  CC: Neck and back pain follow-up  [id number]  Donna [name] is a 32 y.o. female coming in with complaint of back and neck pain. OMT on 10/07/2023. Patient states that last week she developed an increase in pain in L cervical and thoracic spine.   Also c/o R wrist pain for past week. Noticed it while lifting items at [employer]. Pain near [facility] joint and anterior aspect. Pain intermittent and no injury to area. Denies any radiating symptoms.   Medications patient has been prescribed:   Taking:         Reviewed prior external information including notes and imaging from previsou exam, outside providers and external EMR if available.   As well as notes that were available from care everywhere and other healthcare systems.  Past medical history, social, surgical and family history all reviewed in electronic medical record.  No pertanent information unless stated regarding to the chief complaint.   Past Medical History:  Diagnosis Date   Acid reflux    Anemia    History of palpitations     Allergies  Allergen Reactions   Penicillin G Rash   Sulfamethoxazole Rash     Review of Systems:  No headache, visual changes, nausea, vomiting, diarrhea, constipation, dizziness, abdominal pain, skin rash, fevers, chills, night sweats, weight loss, swollen lymph nodes, body aches, joint swelling, chest pain, shortness of breath, mood changes. POSITIVE muscle aches  Objective  Blood pressure 120/82, pulse 93, height 5\' 3"  (1.6 m), weight 131 lb (59.4 kg), SpO2 98%.   General: No apparent distress alert and oriented x3 mood and affect normal, dressed appropriately.  HEENT: Pupils equal, extraocular movements intact  Respiratory: Patient's  speak in full sentences and does not appear short of breath  Cardiovascular: No lower extremity edema, non tender, no erythema  Gait MSK:  Back does have some loss lordosis noted.  Some tenderness to palpation and in the paraspinal musculature.  Patient does have hypermobility noted.  Right wrist exam does have some very mild crepitus in the first and second compartments of the dorsal aspect of the wrist.   Osteopathic findings  C3 flexed rotated and side bent right C7 flexed rotated and side bent left T3 extended rotated and side bent right inhaled rib T7 extended rotated and side bent left L5 flexed rotated and side bent left Sacrum right on right       Assessment and Plan:  SI (sacroiliac) joint dysfunction Chronic problem secondary to some hypermobility noted.  Discussed with patient about which activities to do and which ones to avoid.  Increase activity slowly over the course of next several weeks.  Discussed icing regimen.  Follow-up again in 6 to 8 weeks otherwise.    Nonallopathic problems  Decision today to treat with OMT was based on Physical Exam  After verbal consent patient was treated with HVLA, ME, FPR techniques in cervical, rib, thoracic, lumbar, and sacral  areas  Patient tolerated the procedure well with improvement in symptoms  Patient given exercises, stretches and lifestyle modifications  See medications in patient instructions if given  Patient will follow up in 4-8 weeks    The above documentation has been reviewed and is accurate and complete Judi Saa, DO  Note: This dictation was prepared with Dragon dictation along with smaller phrase technology. Any transcriptional errors that result from this process are unintentional.

## 2023-11-19 ENCOUNTER — Encounter: Payer: Self-pay | Admitting: Family Medicine

## 2023-11-19 ENCOUNTER — Ambulatory Visit: Payer: BC Managed Care – PPO | Admitting: Family Medicine

## 2023-11-19 VITALS — BP 120/82 | HR 93 | Ht 63.0 in | Wt 131.0 lb

## 2023-11-19 DIAGNOSIS — M9902 Segmental and somatic dysfunction of thoracic region: Secondary | ICD-10-CM

## 2023-11-19 DIAGNOSIS — M9904 Segmental and somatic dysfunction of sacral region: Secondary | ICD-10-CM | POA: Diagnosis not present

## 2023-11-19 DIAGNOSIS — M533 Sacrococcygeal disorders, not elsewhere classified: Secondary | ICD-10-CM

## 2023-11-19 DIAGNOSIS — M9901 Segmental and somatic dysfunction of cervical region: Secondary | ICD-10-CM | POA: Diagnosis not present

## 2023-11-19 DIAGNOSIS — M9903 Segmental and somatic dysfunction of lumbar region: Secondary | ICD-10-CM

## 2023-11-19 DIAGNOSIS — M9908 Segmental and somatic dysfunction of rib cage: Secondary | ICD-10-CM

## 2023-11-19 DIAGNOSIS — F4322 Adjustment disorder with anxiety: Secondary | ICD-10-CM | POA: Diagnosis not present

## 2023-11-19 NOTE — Assessment & Plan Note (Signed)
Chronic problem secondary to some hypermobility noted.  Discussed with patient about which activities to do and which ones to avoid.  Increase activity slowly over the course of next several weeks.  Discussed icing regimen.  Follow-up again in 6 to 8 weeks otherwise.

## 2023-11-19 NOTE — Patient Instructions (Addendum)
[place] to wrist with activity See me again in 2 months

## 2023-12-03 DIAGNOSIS — F4322 Adjustment disorder with anxiety: Secondary | ICD-10-CM | POA: Diagnosis not present

## 2023-12-17 DIAGNOSIS — F4322 Adjustment disorder with anxiety: Secondary | ICD-10-CM | POA: Diagnosis not present

## 2024-01-04 IMAGING — US US SONOHYSTEROGRAM - WITH RAD
1 series · 14 of 25 positions shown · non-contrast
Comparison: None.

CLINICAL DATA: Post coital bleeding.

EXAM:
US SONOHYSTEROGRAM
TECHNIQUE: Following cleansing of the cervix and vagina with Betadine, a
hysterosalpingogram catheter was placed within the endocervical
canal. Sonohysterogram was then performed with transvaginal
sonography during infusion of sterile saline solution into the
endometrial cavity.

[Series 1: us sonohysterogram - with rad · 0.11mm/px · 47 acquisitions, 14 frames shown]
[im 1/47]
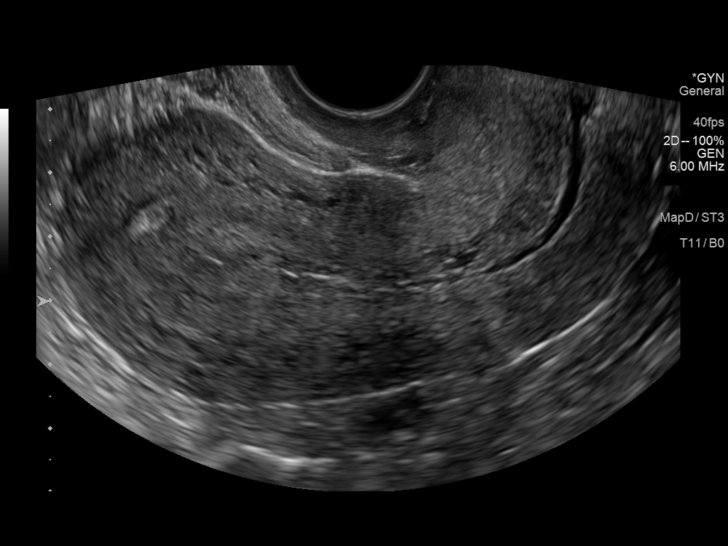
[im 4/47]
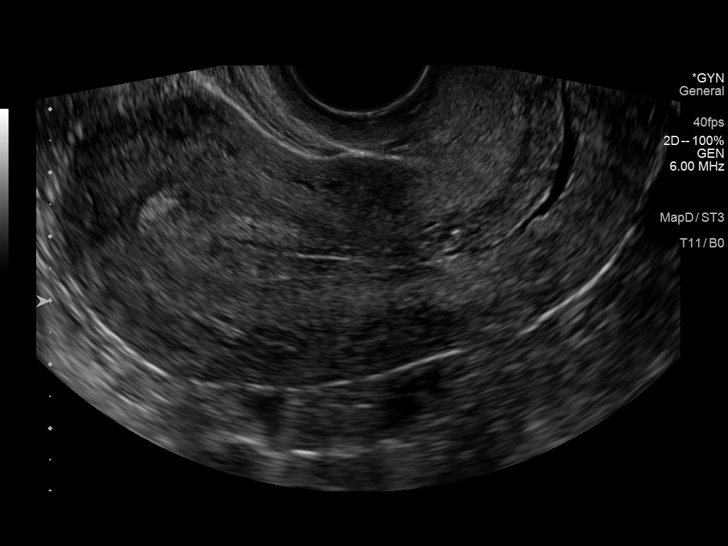
[im 8/47]
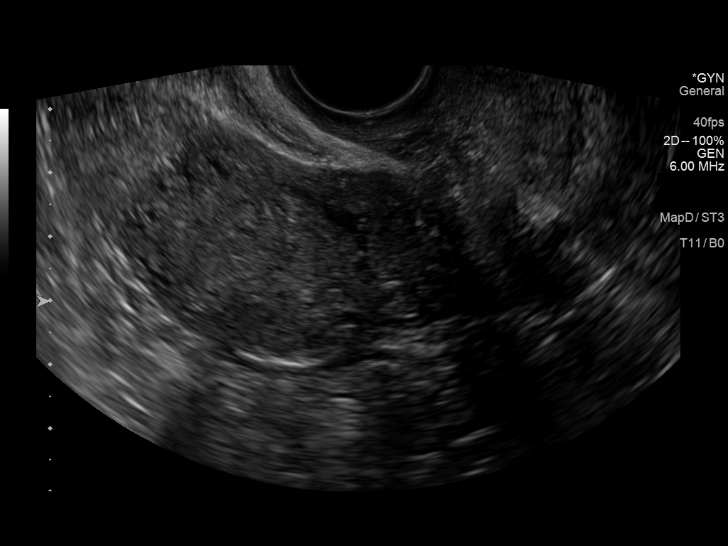
[im 12/47]
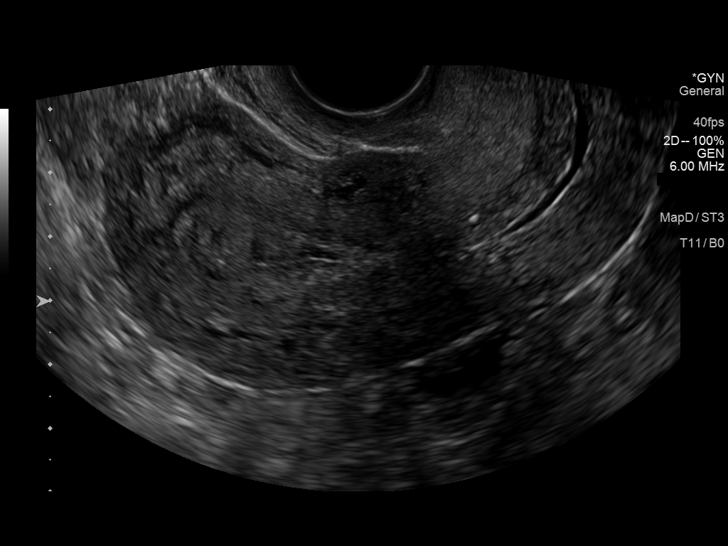
[im 16/47]
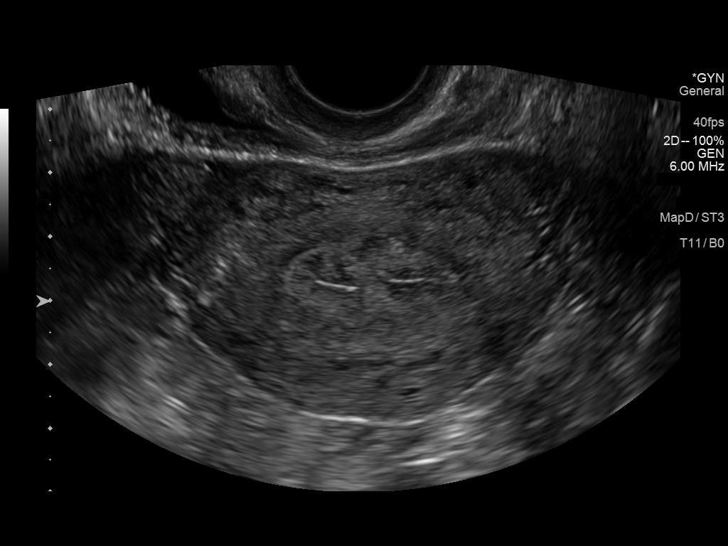
[im 18/47]
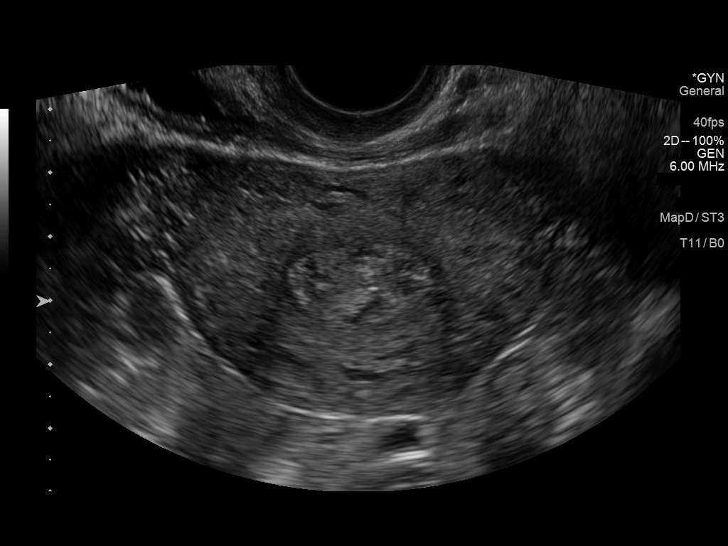
[im 22/47]
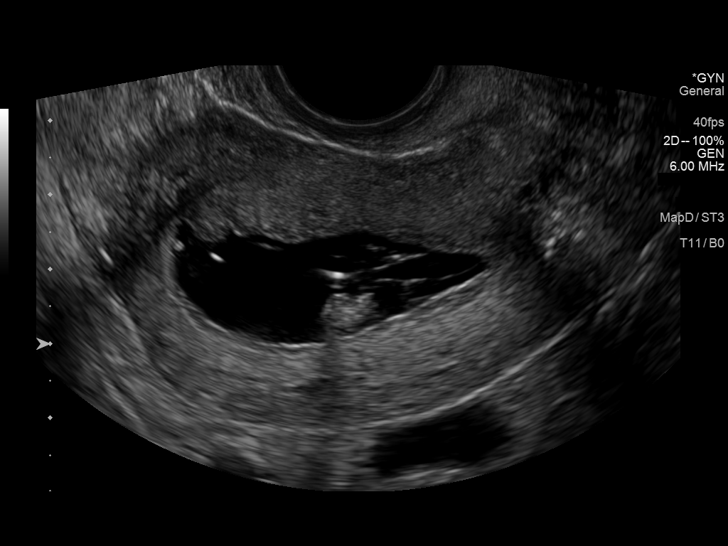
[im 25/47]
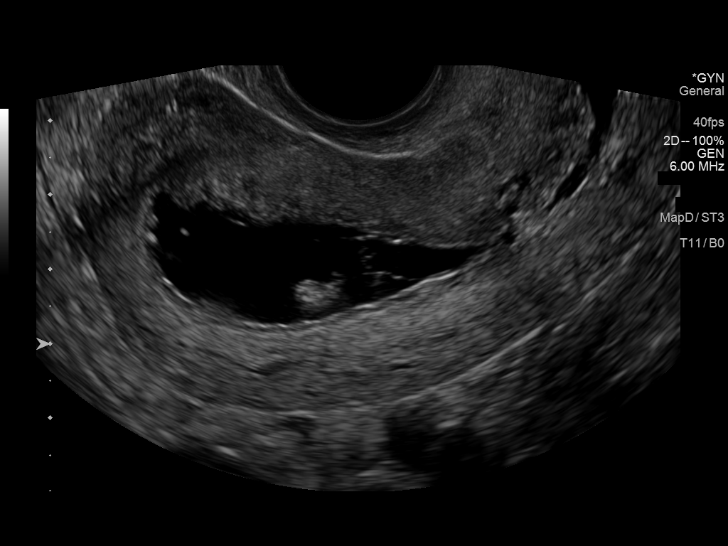
[im 29/47]
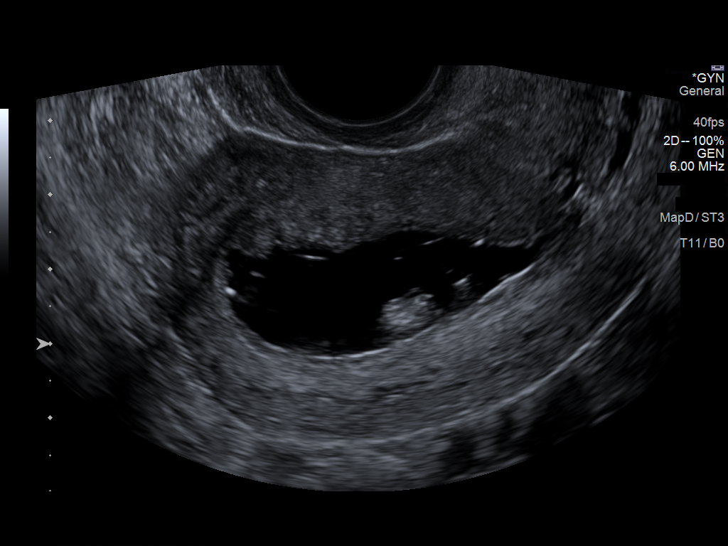
[im 31/47]
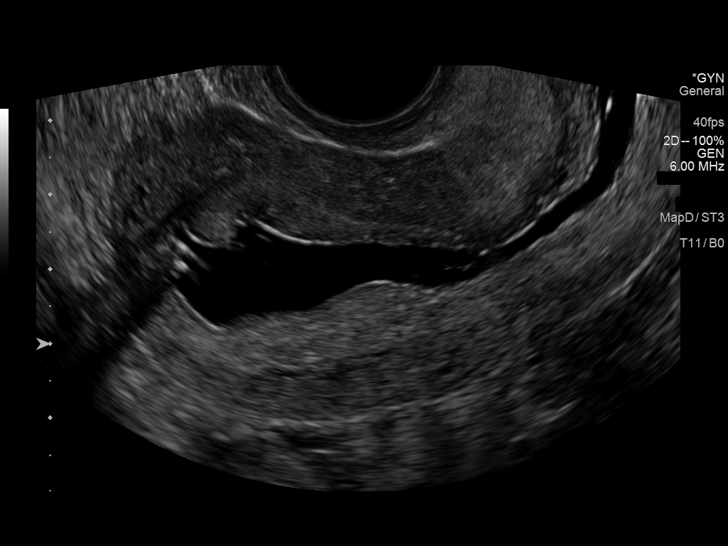
[im 35/47]
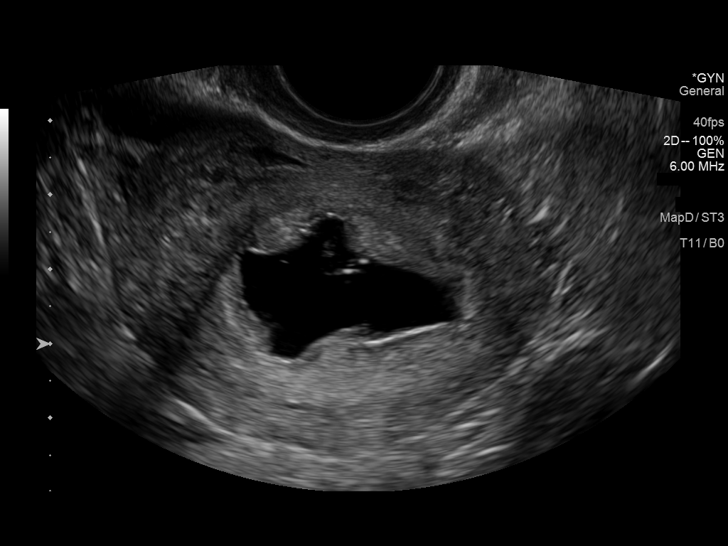
[im 39/47]
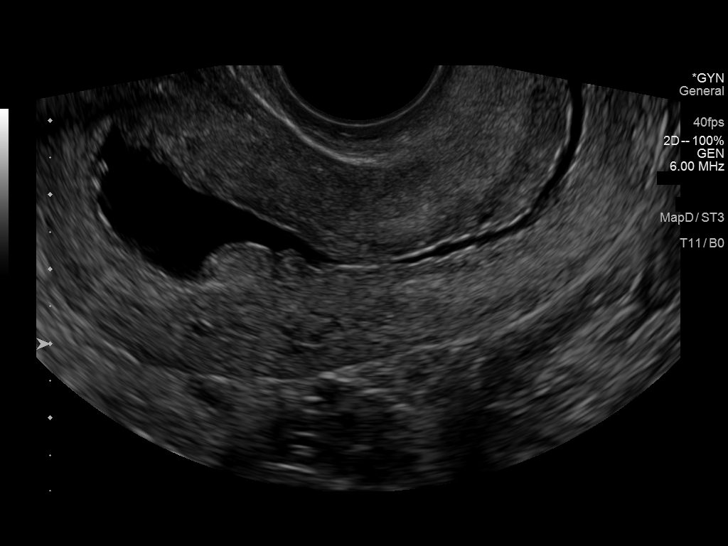
[im 43/47]
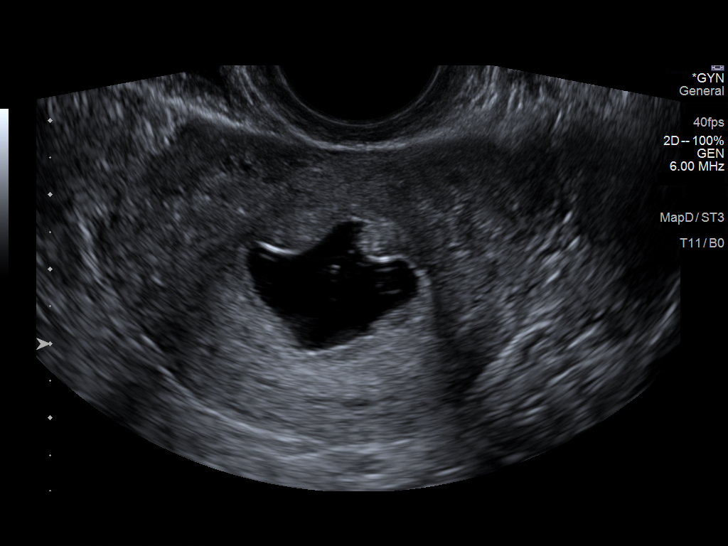
[im 47/47]
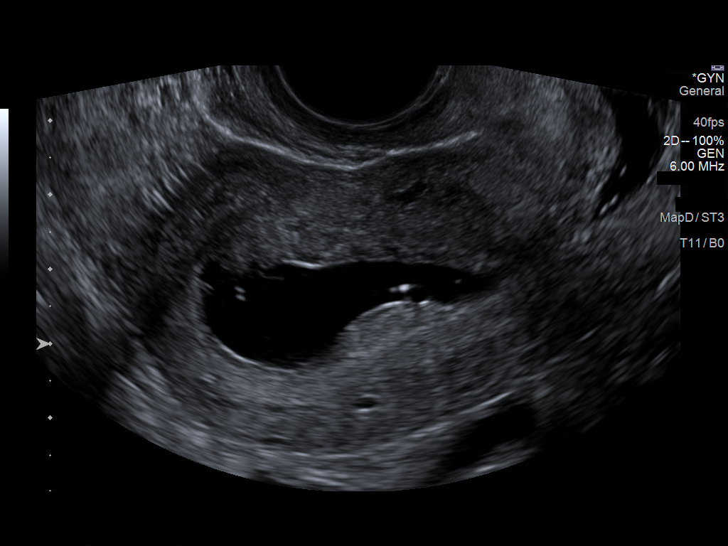

[14 of 25 positions shown; findings below may reference images not displayed]

FINDINGS: With reflux of saline into the uterus, the endometrial stripe is
normal. However, multiple small polyps are present. Along the
posterior wall the uterus, adjacent small polyps are 1.2 x 0.6 and
0.5 x 0.4 centimeters.

In the anterior fundal uterus, small polyp is 0.6 x 0.4 centimeters.

A small anterior broad-based polyp is 0.8 x 0.5 centimeters. No
fibroids are identified.
IMPRESSION: Small uterine polyps, largest measuring 1.2 centimeters.

## 2024-01-07 DIAGNOSIS — F4322 Adjustment disorder with anxiety: Secondary | ICD-10-CM | POA: Diagnosis not present

## 2024-01-14 DIAGNOSIS — F4322 Adjustment disorder with anxiety: Secondary | ICD-10-CM | POA: Diagnosis not present

## 2024-01-16 NOTE — Progress Notes (Unsigned)
  Donna [name] [address] [place] [address] Phone: [phone] Subjective:   Donna [name], am serving as a scribe for Dr. Antoine Primas.  I'm seeing this patient by the request  of:  Olive Bass, FNP  CC: Low back and neck pain follow-up  [id number]  Donna [name] is a 33 y.o. female coming in with complaint of back and neck pain. OMT on 11/19/2023. Patient states same per usual. When stretching kind of feel like feet go numb.  Medications patient has been prescribed:   Taking:         Reviewed prior external information including notes and imaging from previsou exam, outside providers and external EMR if available.   As well as notes that were available from care everywhere and other healthcare systems.  Past medical history, social, surgical and family history all reviewed in electronic medical record.  No pertanent information unless stated regarding to the chief complaint.   Past Medical History:  Diagnosis Date   Acid reflux    Anemia    History of palpitations     Allergies  Allergen Reactions   Penicillin G Rash   Sulfamethoxazole Rash     Review of Systems:  No headache, visual changes, nausea, vomiting, diarrhea, constipation, dizziness, abdominal pain, skin rash, fevers, chills, night sweats, weight loss, swollen lymph nodes, body aches, joint swelling, chest pain, shortness of breath, mood changes. POSITIVE muscle aches  Objective  Blood pressure 110/68, pulse 86, height 5\' 3"  (1.6 m), weight 134 lb (60.8 kg), SpO2 98%.   General: No apparent distress alert and oriented x3 mood and affect normal, dressed appropriately.  HEENT: Pupils equal, extraocular movements intact  Respiratory: Patient's speak in full sentences and does not appear short of breath  Cardiovascular: No lower extremity edema, non tender, no erythema  Gait MSK:  Back mild scoliosis, patient has significant tenderness on noted in  the upper back.  Seems to be on the left parascapular area.  Osteopathic findings  C2 flexed rotated and side bent left C6 flexed rotated and side bent left T7 extended rotated and side bent left inhaled rib T9 extended rotated and side bent left L2 flexed rotated and side bent right Sacrum right on right       Assessment and Plan:  No problem-specific Assessment & Plan notes found for this encounter.    Nonallopathic problems  Decision today to treat with OMT was based on Physical Exam  After verbal consent patient was treated with HVLA, ME, FPR techniques in cervical, rib, thoracic, lumbar, and sacral  areas  Patient tolerated the procedure well with improvement in symptoms  Patient given exercises, stretches and lifestyle modifications  See medications in patient instructions if given  Patient will follow up in 4-8 weeks     The above documentation has been reviewed and is accurate and complete Judi Saa, DO         Note: This dictation was prepared with Dragon dictation along with smaller phrase technology. Any transcriptional errors that result from this process are unintentional.

## 2024-01-21 ENCOUNTER — Encounter: Payer: Self-pay | Admitting: Family Medicine

## 2024-01-21 ENCOUNTER — Ambulatory Visit: Payer: BC Managed Care – PPO | Admitting: Family Medicine

## 2024-01-21 VITALS — BP 110/68 | HR 86 | Ht 63.0 in | Wt 134.0 lb

## 2024-01-21 DIAGNOSIS — M9904 Segmental and somatic dysfunction of sacral region: Secondary | ICD-10-CM

## 2024-01-21 DIAGNOSIS — M533 Sacrococcygeal disorders, not elsewhere classified: Secondary | ICD-10-CM

## 2024-01-21 DIAGNOSIS — M9903 Segmental and somatic dysfunction of lumbar region: Secondary | ICD-10-CM | POA: Diagnosis not present

## 2024-01-21 DIAGNOSIS — M9902 Segmental and somatic dysfunction of thoracic region: Secondary | ICD-10-CM

## 2024-01-21 DIAGNOSIS — M9901 Segmental and somatic dysfunction of cervical region: Secondary | ICD-10-CM

## 2024-01-21 DIAGNOSIS — M9908 Segmental and somatic dysfunction of rib cage: Secondary | ICD-10-CM

## 2024-01-21 DIAGNOSIS — F4322 Adjustment disorder with anxiety: Secondary | ICD-10-CM | POA: Diagnosis not present

## 2024-01-21 NOTE — Assessment & Plan Note (Signed)
Tightness in the paraspinal musculature of the lumbar spine noted.  Discussed icing regimen and home exercises, continue to work on posture and ergonomics.  Did have more exacerbation of the lower back in the neck noted today.  Will continue to monitor.  Follow-up again in 6 to 8 weeks.

## 2024-01-21 NOTE — Patient Instructions (Signed)
Sorry for being late Love idea of yoga See me in 7-8 weeks

## 2024-02-04 DIAGNOSIS — F4323 Adjustment disorder with mixed anxiety and depressed mood: Secondary | ICD-10-CM | POA: Diagnosis not present

## 2024-02-18 DIAGNOSIS — F4323 Adjustment disorder with mixed anxiety and depressed mood: Secondary | ICD-10-CM | POA: Diagnosis not present

## 2024-02-25 DIAGNOSIS — F4322 Adjustment disorder with anxiety: Secondary | ICD-10-CM | POA: Diagnosis not present

## 2024-03-03 DIAGNOSIS — F4323 Adjustment disorder with mixed anxiety and depressed mood: Secondary | ICD-10-CM | POA: Diagnosis not present

## 2024-03-07 DIAGNOSIS — R82998 Other abnormal findings in urine: Secondary | ICD-10-CM | POA: Diagnosis not present

## 2024-03-07 DIAGNOSIS — R42 Dizziness and giddiness: Secondary | ICD-10-CM | POA: Diagnosis not present

## 2024-03-07 DIAGNOSIS — H6993 Unspecified Eustachian tube disorder, bilateral: Secondary | ICD-10-CM | POA: Diagnosis not present

## 2024-03-07 DIAGNOSIS — K219 Gastro-esophageal reflux disease without esophagitis: Secondary | ICD-10-CM | POA: Diagnosis not present

## 2024-03-10 DIAGNOSIS — F4322 Adjustment disorder with anxiety: Secondary | ICD-10-CM | POA: Diagnosis not present

## 2024-03-10 NOTE — Progress Notes (Deleted)
  [name] [address] [place] [address] Phone: [phone] Subjective:    I'm seeing this patient by the request  of:  Olive Bass, FNP  CC:   [id number]  Donna [name] is a 33 y.o. female coming in with complaint of back and neck pain. OMT on 01/21/2024. Patient states   Medications patient has been prescribed:   Taking:         Reviewed prior external information including notes and imaging from previsou exam, outside providers and external EMR if available.   As well as notes that were available from care everywhere and other healthcare systems.  Past medical history, social, surgical and family history all reviewed in electronic medical record.  No pertanent information unless stated regarding to the chief complaint.   Past Medical History:  Diagnosis Date   Acid reflux    Anemia    History of palpitations     Allergies  Allergen Reactions   Penicillin G Rash   Sulfamethoxazole Rash     Review of Systems:  No headache, visual changes, nausea, vomiting, diarrhea, constipation, dizziness, abdominal pain, skin rash, fevers, chills, night sweats, weight loss, swollen lymph nodes, body aches, joint swelling, chest pain, shortness of breath, mood changes. POSITIVE muscle aches  Objective  There were no vitals taken for this visit.   General: No apparent distress alert and oriented x3 mood and affect normal, dressed appropriately.  HEENT: Pupils equal, extraocular movements intact  Respiratory: Patient's speak in full sentences and does not appear short of breath  Cardiovascular: No lower extremity edema, non tender, no erythema  Gait MSK:  Back   Osteopathic findings  C2 flexed rotated and side bent right C6 flexed rotated and side bent left T3 extended rotated and side bent right inhaled rib T9 extended rotated and side bent left L2 flexed rotated and side bent right Sacrum right on right        Assessment and Plan:  No problem-specific Assessment & Plan notes found for this encounter.    Nonallopathic problems  Decision today to treat with OMT was based on Physical Exam  After verbal consent patient was treated with HVLA, ME, FPR techniques in cervical, rib, thoracic, lumbar, and sacral  areas  Patient tolerated the procedure well with improvement in symptoms  Patient given exercises, stretches and lifestyle modifications  See medications in patient instructions if given  Patient will follow up in 4-8 weeks             Note: This dictation was prepared with Dragon dictation along with smaller phrase technology. Any transcriptional errors that result from this process are unintentional.

## 2024-03-11 ENCOUNTER — Ambulatory Visit: Payer: BC Managed Care – PPO | Admitting: Family Medicine

## 2024-03-18 NOTE — Progress Notes (Unsigned)
  [name] [address] [place] [address] Phone: [phone] Subjective:   [name], am serving as a scribe for Dr. Antoine Primas.  I'm seeing this patient by the request  of:  Olive Bass, FNP  CC: Back and neck pain follow-up  [id number]  Donna [name] is a 33 y.o. female coming in with complaint of back and neck pain. OMT 01/21/2024. Patient states same per usual. R hand and L foot.   Medications patient has been prescribed: None  Taking:         Reviewed prior external information including notes and imaging from previsou exam, outside providers and external EMR if available.   As well as notes that were available from care everywhere and other healthcare systems.  Past medical history, social, surgical and family history all reviewed in electronic medical record.  No pertanent information unless stated regarding to the chief complaint.   Past Medical History:  Diagnosis Date   Acid reflux    Anemia    History of palpitations     Allergies  Allergen Reactions   Penicillin G Rash   Sulfamethoxazole Rash     Review of Systems:  No headache, visual changes, nausea, vomiting, diarrhea, constipation, dizziness, abdominal pain, skin rash, fevers, chills, night sweats, weight loss, swollen lymph nodes, body aches, joint swelling, chest pain, shortness of breath, mood changes. POSITIVE muscle aches  Objective  Blood pressure 120/80, pulse 84, height 5\' 3"  (1.6 m), SpO2 98%.   General: No apparent distress alert and oriented x3 mood and affect normal, dressed appropriately.  HEENT: Pupils equal, extraocular movements intact  Respiratory: Patient's speak in full sentences and does not appear short of breath  Cardiovascular: No lower extremity edema, non tender, no erythema  Thoracolumbar juncture tightness noted.  Some tenderness to palpation in the paraspinal musculature.  Osteopathic findings  C3 flexed  rotated and side bent right C6 flexed rotated and side bent left T3 extended rotated and side bent right inhaled rib T9 extended rotated and side bent left L2 flexed rotated and side bent right L3 flexed rotated and side bent left Sacrum right on right     Assessment and Plan:  Thoracogenic scoliosis of thoracolumbar region Still has the thoracolumbar scoliosis.  Will continue to monitor.  Discussed icing regimen and home exercises, discussed which activities to do and which ones to avoid.  Increase activity slowly over the course of next several weeks.  Discussed icing regimen.  Follow-up again in 6 to 8 weeks    Nonallopathic problems  Decision today to treat with OMT was based on Physical Exam  After verbal consent patient was treated with HVLA, ME, FPR techniques in cervical, rib, thoracic, lumbar, and sacral  areas  Patient tolerated the procedure well with improvement in symptoms  Patient given exercises, stretches and lifestyle modifications  See medications in patient instructions if given  Patient will follow up in 4-8 weeks    The above documentation has been reviewed and is accurate and complete Judi Saa, DO          Note: This dictation was prepared with Dragon dictation along with smaller phrase technology. Any transcriptional errors that result from this process are unintentional.

## 2024-03-19 ENCOUNTER — Encounter: Payer: Self-pay | Admitting: Family Medicine

## 2024-03-19 ENCOUNTER — Ambulatory Visit: Admitting: Family Medicine

## 2024-03-19 VITALS — BP 120/80 | HR 84 | Ht 63.0 in

## 2024-03-19 DIAGNOSIS — M9901 Segmental and somatic dysfunction of cervical region: Secondary | ICD-10-CM

## 2024-03-19 DIAGNOSIS — M4135 Thoracogenic scoliosis, thoracolumbar region: Secondary | ICD-10-CM

## 2024-03-19 DIAGNOSIS — M9904 Segmental and somatic dysfunction of sacral region: Secondary | ICD-10-CM | POA: Diagnosis not present

## 2024-03-19 DIAGNOSIS — M9902 Segmental and somatic dysfunction of thoracic region: Secondary | ICD-10-CM | POA: Diagnosis not present

## 2024-03-19 DIAGNOSIS — M9908 Segmental and somatic dysfunction of rib cage: Secondary | ICD-10-CM | POA: Diagnosis not present

## 2024-03-19 DIAGNOSIS — M9903 Segmental and somatic dysfunction of lumbar region: Secondary | ICD-10-CM | POA: Diagnosis not present

## 2024-03-19 DIAGNOSIS — M25531 Pain in right wrist: Secondary | ICD-10-CM

## 2024-03-19 NOTE — Patient Instructions (Signed)
 Vertical mouse Other wise doing well See me before [place] trip

## 2024-03-19 NOTE — Assessment & Plan Note (Signed)
 Seems to be ergonomic, discussed vertical mouse, given letter for patient's work.

## 2024-03-19 NOTE — Assessment & Plan Note (Signed)
 Still has the thoracolumbar scoliosis.  Will continue to monitor.  Discussed icing regimen and home exercises, discussed which activities to do and which ones to avoid.  Increase activity slowly over the course of next several weeks.  Discussed icing regimen.  Follow-up again in 6 to 8 weeks

## 2024-03-24 DIAGNOSIS — F4322 Adjustment disorder with anxiety: Secondary | ICD-10-CM | POA: Diagnosis not present

## 2024-03-31 DIAGNOSIS — F4323 Adjustment disorder with mixed anxiety and depressed mood: Secondary | ICD-10-CM | POA: Diagnosis not present

## 2024-04-07 DIAGNOSIS — F4322 Adjustment disorder with anxiety: Secondary | ICD-10-CM | POA: Diagnosis not present

## 2024-04-09 DIAGNOSIS — R110 Nausea: Secondary | ICD-10-CM | POA: Diagnosis not present

## 2024-04-09 DIAGNOSIS — R42 Dizziness and giddiness: Secondary | ICD-10-CM | POA: Diagnosis not present

## 2024-04-12 ENCOUNTER — Telehealth: Payer: Self-pay

## 2024-04-12 NOTE — Telephone Encounter (Signed)
 Returned patients phone call to schedule annual appointment. Left voicemail with our contact information for her to call back.

## 2024-04-21 DIAGNOSIS — F4322 Adjustment disorder with anxiety: Secondary | ICD-10-CM | POA: Diagnosis not present

## 2024-04-22 ENCOUNTER — Ambulatory Visit: Admitting: Family

## 2024-04-22 ENCOUNTER — Encounter: Payer: Self-pay | Admitting: Family

## 2024-04-22 VITALS — BP 114/72 | HR 97 | Ht 63.0 in | Wt 126.2 lb

## 2024-04-22 DIAGNOSIS — R109 Unspecified abdominal pain: Secondary | ICD-10-CM | POA: Diagnosis not present

## 2024-04-22 DIAGNOSIS — K219 Gastro-esophageal reflux disease without esophagitis: Secondary | ICD-10-CM

## 2024-04-22 DIAGNOSIS — R634 Abnormal weight loss: Secondary | ICD-10-CM

## 2024-04-22 LAB — COMPREHENSIVE METABOLIC PANEL WITH GFR
ALT: 6 U/L (ref 0–35)
AST: 10 U/L (ref 0–37)
Albumin: 4.4 g/dL (ref 3.5–5.2)
Alkaline Phosphatase: 44 U/L (ref 39–117)
BUN: 9 mg/dL (ref 6–23)
CO2: 29 meq/L (ref 19–32)
Calcium: 9.3 mg/dL (ref 8.4–10.5)
Chloride: 103 meq/L (ref 96–112)
Creatinine, Ser: 0.69 mg/dL (ref 0.40–1.20)
GFR: 115.43 mL/min (ref >60.00)
Glucose, Bld: 90 mg/dL (ref 70–99)
Potassium: 4.1 meq/L (ref 3.5–5.1)
Sodium: 138 meq/L (ref 135–145)
Total Bilirubin: 0.6 mg/dL (ref 0.2–1.2)
Total Protein: 6.9 g/dL (ref 6.0–8.3)

## 2024-04-22 LAB — CBC WITH DIFFERENTIAL/PLATELET
Basophils Absolute: 0.1 10*3/uL (ref 0.0–0.1)
Basophils Relative: 1.2 % (ref 0.0–3.0)
Eosinophils Absolute: 0 10*3/uL (ref 0.0–0.7)
Eosinophils Relative: 0.3 % (ref 0.0–5.0)
HCT: 36.8 % (ref 36.0–46.0)
Hemoglobin: 12.2 g/dL (ref 12.0–15.0)
Lymphocytes Relative: 22.6 % (ref 12.0–46.0)
Lymphs Abs: 0.9 10*3/uL (ref 0.7–4.0)
MCHC: 33.2 g/dL (ref 30.0–36.0)
MCV: 88 fl (ref 78.0–100.0)
Monocytes Absolute: 0.3 10*3/uL (ref 0.1–1.0)
Monocytes Relative: 6.2 % (ref 3.0–12.0)
Neutro Abs: 2.9 10*3/uL (ref 1.4–7.7)
Neutrophils Relative %: 69.7 % (ref 43.0–77.0)
Platelets: 297 10*3/uL (ref 150.0–400.0)
RBC: 4.18 Mil/uL (ref 3.87–5.11)
RDW: 15.7 % — ABNORMAL HIGH (ref 11.5–15.5)
WBC: 4.2 10*3/uL (ref 4.0–10.5)

## 2024-04-22 LAB — HEMOGLOBIN A1C: Hgb A1c MFr Bld: 5.3 % (ref 4.6–6.5)

## 2024-04-22 LAB — TSH: TSH: 1.86 u[IU]/mL (ref 0.35–5.50)

## 2024-04-22 MED ORDER — FAMOTIDINE 20 MG PO TABS: 20.0000 mg | ORAL_TABLET | Freq: Two times a day (BID) | ORAL | 0 refills | Status: DC

## 2024-04-22 NOTE — Progress Notes (Signed)
 Donna [name] is a 33 y.o. 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 60 tablet 0   ferrous sulfate 325 (65 FE) MG tablet Take 325 mg by mouth daily with breakfast.     ibuprofen  (ADVIL ) 800 MG tablet Take 1 tablet (800 mg total) by mouth 3 (three) times daily with meals as needed for headache, moderate pain or cramping. 60 tablet 1   Prenatal Vit-Fe Fumarate-FA (MULTIVITAMIN-PRENATAL) 27-0.8 MG TABS tablet Take 1 tablet by mouth daily at 12 noon.     No current facility-administered medications for this visit.    Allergies: Penicillin g and Sulfamethoxazole  Past Medical History:  Diagnosis Date   Acid reflux    Anemia    History of palpitations     Past Surgical History:  Procedure Laterality Date   BREAST BIOPSY Left 04/25/2023   US  LT BREAST BX W LOC DEV 1ST LESION IMG BX SPEC US  GUIDE 04/25/2023 [facility]   DILATATION & CURETTAGE/HYSTEROSCOPY WITH MYOSURE N/A 03/19/2022   Procedure: DILATATION & CURETTAGE/HYSTEROSCOPY WITH MYOSURE;  Surgeon: Lacey Pian, MD;  Location: [facility] [place];  Service: Gynecology;  Laterality: N/A;   UPPER GASTROINTESTINAL ENDOSCOPY  2019    Family History  Problem  Relation Age of Onset   Cancer Maternal Grandfather        Prostate   Diabetes Brother        type 1   Hypertension Neg Hx     Social History   Tobacco Use   Smoking status: Never   Smokeless tobacco: Never  Substance Use Topics   Alcohol use: Yes    Comment: occasionally    Subjective:   Has not been seen in 2 years; started in December with 2024 with nausea and diarrhea- thought this was a "stomach bug." Happened again at the end of January and had sudden onset of diarrhea while eating at a restaurant; since that time, has been having more frequent symptoms; went to [facility] on March 9 with vertigo, nausea- treated with relief but symptoms re-flared earlier this month and went back to [facility] with nausea; feel that she has worsening nausea/ has lost 10 pounds in the past few weeks; admits to being very anxious which is understandable; Has not had any vomiting or changes in bowel movements/ no bleeding noted; has noticed that both of the episodes that caused her to go [facility] did correlate with her period; planning to follow up with her GYN soon;  Has been told in the past that she may have acid reflux- working on making diet changes to get back on track; tried OTC Prilosec and actually felt that it actually made her symptoms worse;  could not tolerate Protonix in the past;  Negative testing for  celiac in 2019 with upper GI;    Objective:  Vitals:   04/22/24 1042  BP: 114/72  Pulse: 97  SpO2: 99%  Weight: 126 lb 3.2 oz (57.2 kg)  Height: 5\' 3"  (1.6 m)    General: Well developed, well nourished, in no acute distress  Skin : Warm and dry.  Head: Normocephalic and atraumatic  Eyes: Sclera and conjunctiva clear; pupils round and reactive to light; extraocular movements intact  Ears: External normal; canals clear; tympanic membranes normal  Oropharynx: Pink, supple. No suspicious lesions  Neck: Supple without thyromegaly, adenopathy  Lungs: Respirations unlabored; clear to auscultation  bilaterally without wheeze, rales, rhonchi  CVS exam: normal rate and regular rhythm.  Abdomen: Soft; nontender; nondistended; normoactive bowel sounds; no masses or hepatosplenomegaly  Neurologic: Alert and oriented; speech intact; face symmetrical; moves all extremities well; CNII-XII intact without focal deficit   Assessment:  1. Weight loss   2. Abdominal pain, unspecified abdominal location   3. Gastroesophageal reflux disease, unspecified whether esophagitis present     Plan:  ? Etiology; discussed possible food allergy vs gallbladder source vs hormonal changes related to her periods; will go ahead and update labs today and plan for abdominal/ pelvic CT; to consider GI and/or allergy referral after CT results obtained; patient had endoscopy in 2019 which ruled out celiac disease; trial of Pepcid  as patient has not done well on 2 different PPIs in the past;   No follow-ups on file.  Orders Placed This Encounter  Procedures   CT ABDOMEN PELVIS W CONTRAST    Standing Status:   Future    Expiration Date:   04/22/2025    If indicated for the ordered procedure, I authorize the administration of contrast media per Radiology protocol:   Yes    Does the patient have a contrast media/X-ray dye allergy?:   No    Is patient pregnant?:   No    Preferred imaging location?:   [facility]    If indicated for the ordered procedure, I authorize the administration of oral contrast media per Radiology protocol:   Yes   CBC with Differential/Platelet   Comp Met (CMET)   TSH   Hemoglobin A1c    Requested Prescriptions   Signed Prescriptions Disp Refills   famotidine  (PEPCID ) 20 MG tablet 60 tablet 0    Sig: Take 1 tablet (20 mg total) by mouth 2 (two) times daily.

## 2024-04-22 NOTE — Patient Instructions (Signed)
 Please call your GYN as we discussed;

## 2024-04-28 DIAGNOSIS — F4323 Adjustment disorder with mixed anxiety and depressed mood: Secondary | ICD-10-CM | POA: Diagnosis not present

## 2024-04-29 ENCOUNTER — Telehealth (HOSPITAL_BASED_OUTPATIENT_CLINIC_OR_DEPARTMENT_OTHER): Payer: Self-pay

## 2024-05-05 ENCOUNTER — Telehealth: Payer: Self-pay

## 2024-05-05 DIAGNOSIS — F4323 Adjustment disorder with mixed anxiety and depressed mood: Secondary | ICD-10-CM | POA: Diagnosis not present

## 2024-05-05 NOTE — Telephone Encounter (Signed)
 Patient called in to schedule her annual exam with Dr. Vallarie Gauze. I informed her that she is primarily at our [facility] office. Patient would like to transfer care there since Dr. Vallarie Gauze knows all of her history.   Gave her KV's phone number to call and schedule.

## 2024-05-06 ENCOUNTER — Encounter: Payer: Self-pay | Admitting: Family

## 2024-05-10 ENCOUNTER — Telehealth: Payer: Self-pay | Admitting: Family

## 2024-05-10 NOTE — Telephone Encounter (Signed)
 Copied from CRM [id number]. Topic: Referral - Prior Authorization Question >> May 10, 2024 11:02 AM Magdalene School wrote: Reason for CRM: Donna [name] [place] [facility] calling to request a prior authorization for CT scan because insurance [employer] requires it.

## 2024-05-11 ENCOUNTER — Telehealth: Payer: Self-pay | Admitting: Family

## 2024-05-11 NOTE — Telephone Encounter (Signed)
 Copied from CRM [id number]. Topic: Clinical - Medical Advice >> May 11, 2024  9:27 AM Earnestine Goes B wrote: Reason for CRM [facility] called to advise pt needs a prior authorization for her ct scan scheduled for tomorrow, please obtain a prior auth, call them back at [phone]. Ext [phone] , Kerney Pee as soon as possible.

## 2024-05-12 ENCOUNTER — Ambulatory Visit (HOSPITAL_BASED_OUTPATIENT_CLINIC_OR_DEPARTMENT_OTHER)

## 2024-05-12 DIAGNOSIS — F4323 Adjustment disorder with mixed anxiety and depressed mood: Secondary | ICD-10-CM | POA: Diagnosis not present

## 2024-05-14 NOTE — Telephone Encounter (Unsigned)
 Copied from CRM [id number]. Topic: Referral - Prior Authorization Question >> May 14, 2024 11:42 AM Jenna Moan wrote: Reason for CRM: Milana Ali from  [facility] pre-services [facility] called to check on pre authorization for patients CT scan that is schedule on May 20 at 2pm,needs the information before appointment

## 2024-05-18 ENCOUNTER — Ambulatory Visit (HOSPITAL_BASED_OUTPATIENT_CLINIC_OR_DEPARTMENT_OTHER)

## 2024-05-19 DIAGNOSIS — F4322 Adjustment disorder with anxiety: Secondary | ICD-10-CM | POA: Diagnosis not present

## 2024-05-20 ENCOUNTER — Telehealth: Payer: Self-pay

## 2024-05-20 NOTE — Telephone Encounter (Signed)
 Pt has had to reschedule her CT abdomen 3 times due to still waiting on a prior authorization for CT. Please advise pt has CT scheduled for 05/28/20205.

## 2024-05-20 NOTE — Progress Notes (Unsigned)
 [facility] [address] [place] [address] Phone: [phone] Subjective:   Donna [name], am serving as a scribe for Dr. Ronnell Coins.  I'm seeing this patient by the request  of:  Adra Alanis, FNP  CC: Low back and neck pain follow-up  NUU:[id number]  Donna [name] is a 33 y.o. female coming in with complaint of back and neck pain. OMT on 03/19/2024. Patient states doing well. B leg pain at night. Feels like a compression pain. Only goes away when contracting leg muscles.  Medications patient has been prescribed:   Taking:         Reviewed prior external information including notes and imaging from previsou exam, outside providers and external EMR if available.   As well as notes that were available from care everywhere and other healthcare systems.  Past medical history, social, surgical and family history all reviewed in electronic medical record.  No pertanent information unless stated regarding to the chief complaint.   Past Medical History:  Diagnosis Date   Acid reflux    Anemia    History of palpitations     Allergies  Allergen Reactions   Penicillin G Rash   Sulfamethoxazole Rash     Review of Systems:  No headache, visual changes, nausea, vomiting, diarrhea, constipation, dizziness, abdominal pain, skin rash, fevers, chills, night sweats, weight loss, swollen lymph nodes,  joint swelling, chest pain, shortness of breath, mood changes. POSITIVE muscle aches abdominal discomfort  Objective  Pulse 94, height 5\' 3"  (1.6 m), SpO2 99%.   General: No apparent distress alert and oriented x3 mood and affect normal, dressed appropriately.  HEENT: Pupils equal, extraocular movements intact  Respiratory: Patient's speak in full sentences and does not appear short of breath  Cardiovascular: No lower extremity edema, non tender, no erythema  Gait relatively normal MSK:  Back does have some loss lordosis.  Some mild  weight loss noted.  Still fairly good core strength noted, does have some fullness on the abdomen noted.  Neck exam does have some loss of lordosis noted.  Some limited sidebending bilaterally.  Osteopathic findings  C2 flexed rotated and side bent right C6 flexed rotated and side bent left T3 extended rotated and side bent right inhaled rib T11 extended rotated and side bent left L1 flexed rotated and side bent right L3 flexed rotated and side bent left Sacrum right on right       Assessment and Plan:  Thoracogenic scoliosis of thoracolumbar region Continued tightness noted in the thoracolumbar juncture area.  Patient is awaiting CT abdomen secondary to some of the difficulties she is having with her abdomen as well.  Discussed with patient that I do think that this is a good idea with the fullness we are noting.  Discussed which activities to do and which ones to avoid.  Follow-up again 6 to 8 weeks otherwise.  Nervous to make any significant changes until we know more from the imaging.    Nonallopathic problems  Decision today to treat with OMT was based on Physical Exam  After verbal consent patient was treated with HVLA, ME, FPR techniques in cervical, rib, thoracic, lumbar, and sacral  areas  Patient tolerated the procedure well with improvement in symptoms  Patient given exercises, stretches and lifestyle modifications  See medications in patient instructions if given  Patient will follow up in 4-8 weeks    The above documentation has been reviewed and is accurate and complete Hershy Flenner  Opal Bill, DO          Note: This dictation was prepared with Dragon dictation along with smaller phrase technology. Any transcriptional errors that result from this process are unintentional.

## 2024-05-21 ENCOUNTER — Telehealth: Payer: Self-pay

## 2024-05-21 NOTE — Telephone Encounter (Signed)
 Copied from CRM [id number]. Topic: Referral - Prior Authorization Question >> May 14, 2024 11:42 AM Emmet Harm C wrote: Reason for CRM: Milana Ali from  [facility] pre[facility] called to check on pre authorization for patients CT scan that is schedule on May 20 at 2pm,needs the information before appointment >> May 21, 2024  1:27 PM Chuck Crater wrote: Milana Ali with [facility] stated that patient is scheduled for 05/28 at 2:00 for CT Scan but there is still no Authorization on file. She states that this is the 3rd time they had to reschedule patient.

## 2024-05-21 NOTE — Telephone Encounter (Signed)
 Oh okay sorry about that.  Thank you.

## 2024-05-25 ENCOUNTER — Ambulatory Visit: Admitting: Family Medicine

## 2024-05-25 ENCOUNTER — Encounter: Payer: Self-pay | Admitting: Family Medicine

## 2024-05-25 VITALS — HR 94 | Ht 63.0 in

## 2024-05-25 DIAGNOSIS — M9908 Segmental and somatic dysfunction of rib cage: Secondary | ICD-10-CM

## 2024-05-25 DIAGNOSIS — M9903 Segmental and somatic dysfunction of lumbar region: Secondary | ICD-10-CM | POA: Diagnosis not present

## 2024-05-25 DIAGNOSIS — M9904 Segmental and somatic dysfunction of sacral region: Secondary | ICD-10-CM | POA: Diagnosis not present

## 2024-05-25 DIAGNOSIS — M4135 Thoracogenic scoliosis, thoracolumbar region: Secondary | ICD-10-CM

## 2024-05-25 DIAGNOSIS — M9901 Segmental and somatic dysfunction of cervical region: Secondary | ICD-10-CM | POA: Diagnosis not present

## 2024-05-25 DIAGNOSIS — M9902 Segmental and somatic dysfunction of thoracic region: Secondary | ICD-10-CM

## 2024-05-25 NOTE — Assessment & Plan Note (Signed)
 Continued tightness noted in the thoracolumbar juncture area.  Patient is awaiting CT abdomen secondary to some of the difficulties she is having with her abdomen as well.  Discussed with patient that I do think that this is a good idea with the fullness we are noting.  Discussed which activities to do and which ones to avoid.  Follow-up again 6 to 8 weeks otherwise.  Nervous to make any significant changes until we know more from the imaging.

## 2024-05-25 NOTE — Patient Instructions (Signed)
 Good to see you! Please write us  if you have trouble with CT scan Have a good trip Consider taking a prenatal vitamin See you again in 6-8 weeks

## 2024-05-25 NOTE — Telephone Encounter (Signed)
 Will follow up on the most recent telephone encounter.

## 2024-05-26 ENCOUNTER — Ambulatory Visit (HOSPITAL_BASED_OUTPATIENT_CLINIC_OR_DEPARTMENT_OTHER)

## 2024-05-26 DIAGNOSIS — F4322 Adjustment disorder with anxiety: Secondary | ICD-10-CM | POA: Diagnosis not present

## 2024-05-31 ENCOUNTER — Telehealth: Payer: Self-pay | Admitting: *Deleted

## 2024-05-31 NOTE — Telephone Encounter (Signed)
 Returned call from 1:20 PM. Left patient a message to call and schedule appointment with Dr. Vallarie Gauze.

## 2024-06-01 ENCOUNTER — Telehealth (HOSPITAL_BASED_OUTPATIENT_CLINIC_OR_DEPARTMENT_OTHER): Payer: Self-pay

## 2024-06-01 ENCOUNTER — Other Ambulatory Visit: Payer: Self-pay | Admitting: Family

## 2024-06-01 ENCOUNTER — Telehealth: Payer: Self-pay

## 2024-06-01 DIAGNOSIS — R109 Unspecified abdominal pain: Secondary | ICD-10-CM

## 2024-06-01 NOTE — Telephone Encounter (Signed)
 Do you know anything about this CT being denied?

## 2024-06-01 NOTE — Telephone Encounter (Signed)
 Copied from CRM [id number]. Topic: General - Other >> Jun 01, 2024  4:29 PM Trula Gable C wrote: Reason for CRM: Patient husband called in regarding patients ct , stated she is unable to get it scheduled because it keeps getting denied, would like to know what can they do so she will be able to be scheduled for a ct. Please give husband, Debria Fang a callback   [phone]

## 2024-06-02 ENCOUNTER — Telehealth: Payer: Self-pay | Admitting: Family

## 2024-06-02 ENCOUNTER — Ambulatory Visit (HOSPITAL_BASED_OUTPATIENT_CLINIC_OR_DEPARTMENT_OTHER): Admission: RE | Admit: 2024-06-02 | Source: Ambulatory Visit

## 2024-06-02 DIAGNOSIS — F4322 Adjustment disorder with anxiety: Secondary | ICD-10-CM | POA: Diagnosis not present

## 2024-06-02 NOTE — Telephone Encounter (Signed)
 Copied from CRM [id number]. Topic: General - Other >> Jun 01, 2024  4:29 PM Trula Gable C wrote: Reason for CRM: Patient husband called in regarding patients ct , stated she is unable to get it scheduled because it keeps getting denied, would like to know what can they do so she will be able to be scheduled for a ct. Please give husband, Donna [name] a callback   [phone] >> Jun 01, 2024  5:11 PM Magdalene School wrote: Patient calling to check on status of the CT scan. She would like a call back as her appointment is scheduled for tomorrow 05/02/24 at 3 pm and wants to make sure everything is in order before going to that appointment.

## 2024-06-02 NOTE — Telephone Encounter (Signed)
 Copied from CRM [id number]. Topic: Referral - Prior Authorization Question >> Jun 02, 2024  9:30 AM Jenice Mitts wrote: Reason for CRM: Patient is calling because her CT is today and she has not heard anything about her approval for the CT yet.

## 2024-06-02 NOTE — Telephone Encounter (Signed)
 Called pts husband Debria Fang back and left a VM relaying the message, advising pt a referral will be placed for GI. Benson Brawn to give the office a call back for any further questions or concerns.

## 2024-06-02 NOTE — Telephone Encounter (Signed)
 Please see other phone note

## 2024-06-02 NOTE — Telephone Encounter (Signed)
 Spoke with pt, pt states she contacted imaging and cancelled her appointment. Pt states she received a letter in [place] letting her know a referral to GI had been placed. Pt stated the letter advises to wait 2-3 days for a phone call for scheduling, advised pt if she still has not been contacted by Friday afternoon to give us  a call back and we will try to provide pt with number to GI office. Pt is aware and expressed understanding.

## 2024-06-10 ENCOUNTER — Encounter: Payer: Self-pay | Admitting: Gastroenterology

## 2024-06-16 DIAGNOSIS — F4322 Adjustment disorder with anxiety: Secondary | ICD-10-CM | POA: Diagnosis not present

## 2024-06-23 DIAGNOSIS — F4322 Adjustment disorder with anxiety: Secondary | ICD-10-CM | POA: Diagnosis not present

## 2024-06-24 ENCOUNTER — Other Ambulatory Visit (HOSPITAL_COMMUNITY)
Admission: RE | Admit: 2024-06-24 | Discharge: 2024-06-24 | Disposition: A | Discharge status: Home / Self Care | Source: Ambulatory Visit | Attending: Obstetrics and Gynecology | Admitting: Obstetrics and Gynecology

## 2024-06-24 ENCOUNTER — Encounter: Payer: Self-pay | Admitting: Obstetrics and Gynecology

## 2024-06-24 ENCOUNTER — Ambulatory Visit: Admitting: Obstetrics and Gynecology

## 2024-06-24 VITALS — BP 110/73 | HR 80 | Ht 63.0 in | Wt 122.0 lb

## 2024-06-24 DIAGNOSIS — Z01419 Encounter for gynecological examination (general) (routine) without abnormal findings: Secondary | ICD-10-CM | POA: Diagnosis not present

## 2024-06-24 NOTE — Progress Notes (Signed)
   ANNUAL EXAM Patient name: Donna [name] MRN [id number]  Date of birth: 1991/05/24 Chief Complaint:   Gynecologic Exam (Pt reported having bowel issues sometimes around her period where it can worsen, currently being evaluated by PCP. )  History of Present Illness:   Donna [name] is a 33 y.o. G0P0000 female being seen today for a routine annual exam.   Current concerns: Worsened bowel function around period. Negative celiac testing. She has been referred GI by PCP. Has coinciding heart racing and dizziness sometimes associated. She is going to see GI soon. Some issues with getting CT approved with insurance.   Current birth control: Condoms  Patient's last menstrual period was 05/29/2024 (exact date).   Last Pap/Pap History:  10/2021: Pap normal/HPV negative  Review of Systems:   Pertinent items are noted in HPI Denies any headaches, blurred vision, fatigue, shortness of breath, chest pain, abdominal pain, abnormal vaginal discharge/itching/odor/irritation, problems with periods, urination, or intercourse unless otherwise stated above.  Pertinent History Reviewed:  Reviewed past medical,surgical, social and family history.  Reviewed problem list, medications and allergies. Physical Assessment:   Vitals:   06/24/24 1455  BP: 110/73  Pulse: 80  Weight: 122 lb (55.3 kg)  Height: 5' 3 (1.6 m)  Body mass index is 21.61 kg/m.   4/24: 126 lbs 09/2023: 128 lbs 02/2023: 127lb  Physical Examination:  General appearance - well appearing, and in no distress Mental status - alert, oriented to person, place, and time Psych:  She has a normal mood and affect Skin - warm and dry, normal color, no suspicious lesions noted Chest - effort normal Heart - normal rate  Breasts - breasts appear normal, no suspicious masses, no skin or nipple changes or axillary nodes Abdomen - soft, nontender, nondistended, no masses or organomegaly Pelvic -  not indicated VULVA: normal appearing vulva with  no masses, tenderness or lesions  VAGINA: normal appearing vagina with normal color and discharge, no lesions  CERVIX: normal appearing cervix without discharge or lesions, no CMT UTERUS: Not examined ADNEXA: Not examined Extremities:  No swelling or varicosities noted  Chaperone present for exam  No results found for this or any previous visit (from the past 24 hours).  Assessment & Plan:  Zayana was seen today for gynecologic exam.  Diagnoses and all orders for this visit:  Encounter for annual routine gynecological examination -     Cytology - PAP( [place])  - Cervical cancer screening: Discussed guidelines. Pap with HPV wnl 10/2021 - Gardasil: completed - GC/CT: declines - Birth Control: Condoms - Breast Health: Encouraged self breast awareness/SBE. Teaching provided.  - F/U 12 months and prn  Symptoms sound vagal nerve related. Will message PCP to have them consider neurology referral.    No orders of the defined types were placed in this encounter.   Meds: No orders of the defined types were placed in this encounter.   Follow-up: No follow-ups on file.  Vina Solian, MD 06/24/2024 3:33 PM

## 2024-06-25 ENCOUNTER — Ambulatory Visit: Payer: Self-pay | Admitting: Obstetrics and Gynecology

## 2024-06-25 LAB — CYTOLOGY - PAP
Comment: NEGATIVE
Diagnosis: NEGATIVE
High risk HPV: NEGATIVE

## 2024-06-30 DIAGNOSIS — F4322 Adjustment disorder with anxiety: Secondary | ICD-10-CM | POA: Diagnosis not present

## 2024-07-05 NOTE — Progress Notes (Unsigned)
  [name] [address] [place] [address] Phone: [phone] Subjective:   Donna [name] am a scribe for Dr. Claudene.   I'[name] seeing this patient by the request  of:  Jason Leita Repine, FNP  CC: Back and neck pain follow-up  YEP:[name]  Donna [name] is a 33 y.o. female coming in with complaint of back and neck pain. OMT on 05/25/2024. Patient states back and neck are painful as usual but nothing out of the ordinary.   Medications patient has been prescribed:   Taking:         Reviewed prior external information including notes and imaging from previsou exam, outside providers and external EMR if available.   As well as notes that were available from care everywhere and other healthcare systems.  Past medical history, social, surgical and family history all reviewed in electronic medical record.  No pertanent information unless stated regarding to the chief complaint.   Past Medical History:  Diagnosis Date   Acid reflux    Anemia    History of palpitations     Allergies  Allergen Reactions   Penicillin G Rash   Sulfamethoxazole Rash     Review of Systems:  No headache, visual changes, nausea, vomiting, diarrhea, constipation, dizziness,  skin rash, fevers, chills, night sweats,  swollen lymph nodes, body aches, joint swelling, chest pain, shortness of breath, mood changes. POSITIVE muscle aches, abdominal pain, weakness, weight loss  Objective  Blood pressure 110/60, pulse 96, height 5' 3 (1.6 [name]), weight 120 lb 6.4 oz (54.6 kg), last menstrual period 05/29/2024, SpO2 99%.   General: No apparent distress alert and oriented x3 mood and affect normal, dressed appropriately.  HEENT: Pupils equal, extraocular movements intact  Respiratory: Patient's speak in full sentences and does not appear short of breath  Cardiovascular: No lower extremity edema, non tender, no erythema  Patient does have back pain still noted on  exam.  Scoliosis noted.  Osteopathic findings  C2 flexed rotated and side bent right C6 flexed rotated and side bent left T3 extended rotated and side bent right inhaled rib T9 extended rotated and side bent left L1 flexed rotated and side bent right Sacrum right on right    Assessment and Plan:  Thoracogenic scoliosis of thoracolumbar region Continues to describe symptoms.  Denies low back.  Displacement moderate displacement patient has had some loss and is down to 120 pounds.  Having difficulty with appetite.  Discussed if worsening abdominal pain to seek medical attention immediately.  Follow-up with me again 6 to 8 weeks otherwise.  Once again worsening pain seek medical attention     Nonallopathic problems  Decision today to treat with OMT was based on Physical Exam  After verbal consent patient was treated with HVLA, ME, FPR techniques in cervical, rib, thoracic, lumbar, and sacral  areas  Patient tolerated the procedure well with improvement in symptoms  Patient given exercises, stretches and lifestyle modifications  See medications in patient instructions if given  Patient will follow up in 4-8 weeks     The above documentation has been reviewed and is accurate and complete Donna [name] Donna Vanderveer, DO         Note: This dictation was prepared with Dragon dictation along with smaller phrase technology. Any transcriptional errors that result from this process are unintentional.

## 2024-07-08 ENCOUNTER — Ambulatory Visit: Admitting: Family Medicine

## 2024-07-08 ENCOUNTER — Encounter: Payer: Self-pay | Admitting: Family Medicine

## 2024-07-08 VITALS — BP 110/60 | HR 96 | Ht 63.0 in | Wt 120.4 lb

## 2024-07-08 DIAGNOSIS — M9904 Segmental and somatic dysfunction of sacral region: Secondary | ICD-10-CM | POA: Diagnosis not present

## 2024-07-08 DIAGNOSIS — M9902 Segmental and somatic dysfunction of thoracic region: Secondary | ICD-10-CM

## 2024-07-08 DIAGNOSIS — M4135 Thoracogenic scoliosis, thoracolumbar region: Secondary | ICD-10-CM | POA: Diagnosis not present

## 2024-07-08 DIAGNOSIS — M9903 Segmental and somatic dysfunction of lumbar region: Secondary | ICD-10-CM

## 2024-07-08 DIAGNOSIS — M9908 Segmental and somatic dysfunction of rib cage: Secondary | ICD-10-CM | POA: Diagnosis not present

## 2024-07-08 DIAGNOSIS — M9901 Segmental and somatic dysfunction of cervical region: Secondary | ICD-10-CM | POA: Diagnosis not present

## 2024-07-08 NOTE — Patient Instructions (Addendum)
 Good to see you. BRAT Diet Keep me updated See me again in 2 months

## 2024-07-08 NOTE — Assessment & Plan Note (Signed)
 Continues to describe symptoms.  Denies low back.  Displacement moderate displacement patient has had some loss and is down to 120 pounds.  Having difficulty with appetite.  Discussed if worsening abdominal pain to seek medical attention immediately.  Follow-up with me again 6 to 8 weeks otherwise.  Once again worsening pain seek medical attention

## 2024-07-09 NOTE — Telephone Encounter (Signed)
 Please let the patient know that her GYN asked about updated a neurology referral based on the conversation they had. Is this something that she would like me to do now? Or does she want to see GI first and then we can go from there?

## 2024-07-09 NOTE — Telephone Encounter (Signed)
-----   Message from Vina Solian sent at 06/24/2024  3:35 PM EDT ----- Regarding: Follow up Hi Leita,   I saw this patient today. We discussed how diarrhea will worsen around cycle but this is typical for most women. More we discussed her associated symptoms and her diarrhea, dizziness, and heart racing sounds potentially vagal in origin. I told her I would send you a message and see if you would consider a neuro referral.   Thanks!  Vina

## 2024-07-12 ENCOUNTER — Other Ambulatory Visit: Payer: Self-pay | Admitting: Family

## 2024-07-12 DIAGNOSIS — R42 Dizziness and giddiness: Secondary | ICD-10-CM

## 2024-07-12 NOTE — Telephone Encounter (Signed)
 Copied from CRM [id number]. Topic: Referral - Status >> Jul 12, 2024 12:05 PM [place] A wrote: Reason for CRM: Patient called and said Yes to the referral please

## 2024-07-13 ENCOUNTER — Telehealth: Payer: Self-pay

## 2024-07-13 NOTE — Telephone Encounter (Signed)
 Copied from CRM [id number]. Topic: Clinical - Medical Advice >> Jul 13, 2024 12:12 PM [place] wrote: Reason for CRM: Patient asking for Harlene to call her. She said she is working and is she can not answer the call, that a detailed message can be left

## 2024-07-14 ENCOUNTER — Other Ambulatory Visit: Payer: Self-pay | Admitting: Family

## 2024-07-14 DIAGNOSIS — R42 Dizziness and giddiness: Secondary | ICD-10-CM

## 2024-07-14 DIAGNOSIS — F4322 Adjustment disorder with anxiety: Secondary | ICD-10-CM | POA: Diagnosis not present

## 2024-07-14 NOTE — Telephone Encounter (Signed)
 Spoke with pt, pt states she had 1 or 2 visits at Neurology with [facility], pt states she had a bad experience with [facility] and does not wish to return and prefers to be seen through [facility]. Pt also states she received a letter from insurance stating her CT has been approved, pt states she will call later to schedule CT.

## 2024-07-15 ENCOUNTER — Encounter: Payer: Self-pay | Admitting: Neurology

## 2024-07-17 ENCOUNTER — Ambulatory Visit (HOSPITAL_BASED_OUTPATIENT_CLINIC_OR_DEPARTMENT_OTHER)
Admission: RE | Admit: 2024-07-17 | Discharge: 2024-07-17 | Disposition: A | Discharge status: Home / Self Care | Source: Ambulatory Visit | Attending: Family | Admitting: Family

## 2024-07-17 DIAGNOSIS — N8311 Corpus luteum cyst of right ovary: Secondary | ICD-10-CM | POA: Diagnosis not present

## 2024-07-17 DIAGNOSIS — R109 Unspecified abdominal pain: Secondary | ICD-10-CM | POA: Insufficient documentation

## 2024-07-17 DIAGNOSIS — D1803 Hemangioma of intra-abdominal structures: Secondary | ICD-10-CM | POA: Diagnosis not present

## 2024-07-17 MED ORDER — IOHEXOL 300 MG/ML  SOLN
100.0000 mL | Freq: Once | INTRAMUSCULAR | Status: AC | PRN
  Administered 2024-07-17: 100 mL via INTRAVENOUS

## 2024-07-22 ENCOUNTER — Encounter: Payer: Self-pay | Admitting: Gastroenterology

## 2024-07-22 ENCOUNTER — Other Ambulatory Visit (INDEPENDENT_AMBULATORY_CARE_PROVIDER_SITE_OTHER)

## 2024-07-22 ENCOUNTER — Ambulatory Visit: Admitting: Gastroenterology

## 2024-07-22 VITALS — BP 122/74 | HR 101 | Ht 63.0 in | Wt 120.1 lb

## 2024-07-22 DIAGNOSIS — R110 Nausea: Secondary | ICD-10-CM

## 2024-07-22 DIAGNOSIS — R109 Unspecified abdominal pain: Secondary | ICD-10-CM

## 2024-07-22 DIAGNOSIS — R197 Diarrhea, unspecified: Secondary | ICD-10-CM

## 2024-07-22 DIAGNOSIS — Z800 Family history of malignant neoplasm of digestive organs: Secondary | ICD-10-CM

## 2024-07-22 DIAGNOSIS — R1011 Right upper quadrant pain: Secondary | ICD-10-CM

## 2024-07-22 DIAGNOSIS — R634 Abnormal weight loss: Secondary | ICD-10-CM

## 2024-07-22 DIAGNOSIS — R194 Change in bowel habit: Secondary | ICD-10-CM

## 2024-07-22 DIAGNOSIS — R630 Anorexia: Secondary | ICD-10-CM

## 2024-07-22 DIAGNOSIS — R6881 Early satiety: Secondary | ICD-10-CM

## 2024-07-22 DIAGNOSIS — K219 Gastro-esophageal reflux disease without esophagitis: Secondary | ICD-10-CM

## 2024-07-22 LAB — COMPREHENSIVE METABOLIC PANEL WITH GFR
ALT: 6 U/L (ref 0–35)
AST: 10 U/L (ref 0–37)
Albumin: 4.8 g/dL (ref 3.5–5.2)
Alkaline Phosphatase: 45 U/L (ref 39–117)
BUN: 7 mg/dL (ref 6–23)
CO2: 29 meq/L (ref 19–32)
Calcium: 9.8 mg/dL (ref 8.4–10.5)
Chloride: 102 meq/L (ref 96–112)
Creatinine, Ser: 0.68 mg/dL (ref 0.40–1.20)
GFR: 115.63 mL/min (ref >60.00)
Glucose, Bld: 87 mg/dL (ref 70–99)
Potassium: 3.6 meq/L (ref 3.5–5.1)
Sodium: 140 meq/L (ref 135–145)
Total Bilirubin: 0.8 mg/dL (ref 0.2–1.2)
Total Protein: 7.5 g/dL (ref 6.0–8.3)

## 2024-07-22 LAB — IBC + FERRITIN
Ferritin: 3.4 ng/mL — ABNORMAL LOW (ref 10.0–291.0)
Iron: 31 ug/dL — ABNORMAL LOW (ref 42–145)
Saturation Ratios: 7.9 % — ABNORMAL LOW (ref 20.0–50.0)
TIBC: 390.6 ug/dL (ref 250.0–450.0)
Transferrin: 279 mg/dL (ref 212.0–360.0)

## 2024-07-22 LAB — CBC WITH DIFFERENTIAL/PLATELET
Basophils Absolute: 0.1 K/uL (ref 0.0–0.1)
Basophils Relative: 1.2 % (ref 0.0–3.0)
Eosinophils Absolute: 0 K/uL (ref 0.0–0.7)
Eosinophils Relative: 0.3 % (ref 0.0–5.0)
HCT: 37.1 % (ref 36.0–46.0)
Hemoglobin: 12.3 g/dL (ref 12.0–15.0)
Lymphocytes Relative: 29.3 % (ref 12.0–46.0)
Lymphs Abs: 1.4 K/uL (ref 0.7–4.0)
MCHC: 33.2 g/dL (ref 30.0–36.0)
MCV: 86.4 fl (ref 78.0–100.0)
Monocytes Absolute: 0.3 K/uL (ref 0.1–1.0)
Monocytes Relative: 6.8 % (ref 3.0–12.0)
Neutro Abs: 3 K/uL (ref 1.4–7.7)
Neutrophils Relative %: 62.4 % (ref 43.0–77.0)
Platelets: 263 K/uL (ref 150.0–400.0)
RBC: 4.3 Mil/uL (ref 3.87–5.11)
RDW: 15.5 % (ref 11.5–15.5)
WBC: 4.8 K/uL (ref 4.0–10.5)

## 2024-07-22 LAB — TSH: TSH: 4.83 u[IU]/mL (ref 0.35–5.50)

## 2024-07-22 LAB — C-REACTIVE PROTEIN: CRP: <1 mg/dL (ref 0.5–20.0)

## 2024-07-22 MED ORDER — NA SULFATE-K SULFATE-MG SULF 17.5-3.13-1.6 GM/177ML PO SOLN: 1.0000 | Freq: Once | ORAL | 0 refills | Status: AC

## 2024-07-22 NOTE — Progress Notes (Addendum)
 [name] [id number] 22-Jan-1991   Chief Complaint: Abdominal pain  Referring Provider: Jason Leita Repine,* Primary GI MD: Sampson  HPI: Donna [name] is a 33 y.o. female with past medical history of GERD, anemia, palpitations who presents today for a complaint of abdominal pain.    Patient seen by PCP 04/22/2024 with complaint of nausea and diarrhea.  Reported 10 pound weight loss over the course of a few weeks.  Denied any vomiting or change in bowel movements, no bleeding noted.  Symptoms occurring around the time of her period.  Negative testing for celiac in 2019 with EGD.   Patient is here today with her husband.  She states she has been having intermittent abdominal pain since November, which has become more consistent since March.  Pain is primarily located to her lower abdomen.  She does have an ovarian cyst, and has family history of this, but has also noticed changes in her bowel habits and is concerned that pain may be GI related.  Pain can be crampy or sharp at times.  It has been severe enough that she has had to leave work due to pain.  She reports a lot of nausea without vomiting, decreased appetite, and unintentional weight loss of about 15 pounds since March.  Reports rare heartburn or acid reflux and denies dysphagia.  States that in the past she could go several days without a bowel movement, but now is having loose/occasionally watery stools.  Having a bowel movement every other day or multiple times a day.  When she has diarrhea she feels nauseated and can feel dizzy as well.  Denies syncope.  She denies any blood in her stool but has had some irritation and rectal pain sometimes after bowel movements.  Stool can be black after taking iron or Pepto-Bismol.  Pepto-Bismol does help some with the diarrhea and gives her some confidence in leaving the house.  Stress and anxiety seems to make her diarrhea worse.  She tries to avoid straining with bowel movements.   States she has seen pelvic floor physical therapy, having been referred by OB/GYN.  Patient states her grandmother had colon cancer which was diagnosed in her 39s.  Otherwise she denies any known family history of colon cancer or colon polyps.  Denies any family history of IBD.  Reports she has a history of palpitations and that she had a workup with cardiology which was negative.  She reports a history of iron deficiency anemia which was first diagnosed at age 36.  Sometimes she has heavy periods but they are regular and always last 5 days.  She does not take iron all the time.  Reports that her EGD in 2019 showed GERD but was otherwise normal with biopsies negative for celiac disease.  Previous GI Procedures/Imaging   CT A/P 07/17/2024 2.3 cm benign-appearing right ovarian corpus luteum cyst, which is considered physiologic in a reproductive age female (No followup imaging is recommended) .   3.9 cm benign hepatic hemangioma.  EGD 2019   Past Medical History:  Diagnosis Date   Acid reflux    Anemia    History of palpitations     Past Surgical History:  Procedure Laterality Date   BREAST BIOPSY Left 04/25/2023   US  LT BREAST BX W LOC DEV 1ST LESION IMG BX SPEC US  GUIDE 04/25/2023 [facility]   DILATATION & CURETTAGE/HYSTEROSCOPY WITH MYOSURE N/A 03/19/2022   Procedure: DILATATION & CURETTAGE/HYSTEROSCOPY WITH MYOSURE;  Surgeon: Cleatus Moccasin, MD;  Location: [facility];  Service: Gynecology;  Laterality: N/A;   UPPER GASTROINTESTINAL ENDOSCOPY  2019    Current Outpatient Medications  Medication Sig Dispense Refill   bismuth subsalicylate (PEPTO BISMOL) 262 MG/15ML suspension Take 30 mLs by mouth every 6 (six) hours as needed.     famotidine  (PEPCID ) 20 MG tablet Take 1 tablet (20 mg total) by mouth 2 (two) times daily. 60 tablet 0   ferrous sulfate 325 (65 FE) MG tablet Take 325 mg by mouth daily with breakfast.     ibuprofen  (ADVIL ) 800 MG tablet Take 1  tablet (800 mg total) by mouth 3 (three) times daily with meals as needed for headache, moderate pain or cramping. 60 tablet 1   Prenatal Vit-Fe Fumarate-FA (MULTIVITAMIN-PRENATAL) 27-0.8 MG TABS tablet Take 1 tablet by mouth daily at 12 noon.     No current facility-administered medications for this visit.    Allergies as of 07/22/2024 - Review Complete 07/08/2024  Allergen Reaction Noted   Penicillin g Rash 05/28/2017   Sulfamethoxazole Rash 05/28/2017    Family History  Problem Relation Age of Onset   Cancer Maternal Grandfather        Prostate   Diabetes Brother        type 1   Hypertension Neg Hx     Social History   Tobacco Use   Smoking status: Never   Smokeless tobacco: Never  Vaping Use   Vaping status: Never Used  Substance Use Topics   Alcohol use: Yes    Comment: occasionally   Drug use: Never     Review of Systems:    Constitutional: Positive unintentional weight loss of 15 pounds, no fever, chills, weakness or fatigue Skin: No rash or itching Cardiovascular: No chest pain, chest pressure or palpitations   Respiratory: No SOB or cough Gastrointestinal: See HPI and otherwise negative Genitourinary: No dysuria or change in urinary frequency Neurological: Positive dizziness associated with diarrhea, no headache or syncope Musculoskeletal: No new muscle or joint pain Hematologic: No bleeding or bruising    Physical Exam:  Vital signs: BP 122/74 (BP Location: Left Arm, Patient Position: Sitting, Cuff Size: Normal)   Pulse (!) 101   Ht 5' 3 (1.6 m)   Wt 120 lb 2 oz (54.5 kg)   LMP 05/29/2024 (Exact Date)   BMI 21.28 kg/m    Constitutional: Pleasant female in NAD, appears anxious, alert and cooperative Head:  Normocephalic and atraumatic.  Eyes: No scleral icterus.  Mouth: No oral lesions. Respiratory: Respirations even and unlabored. Lungs clear to auscultation bilaterally.  No wheezes, crackles, or rhonchi.  Cardiovascular:  Regular rate and  rhythm. No murmurs. No peripheral edema. Gastrointestinal:  Soft, nondistended, mild tenderness to palpation of lower abdomen, increased tenderness to palpation of RUQ. No rebound or guarding. Normal bowel sounds. No appreciable masses or hepatomegaly. Rectal: Deferred to colonoscopy Neurologic:  Alert and oriented x4;  grossly normal neurologically.  Skin:   Dry and intact without significant lesions or rashes. Psychiatric: Oriented to person, place and time. Demonstrates good judgement and reason without abnormal affect or behaviors.   RELEVANT LABS AND IMAGING: CBC    Component Value Date/Time   WBC 4.2 04/22/2024 1118   RBC 4.18 04/22/2024 1118   HGB 12.2 04/22/2024 1118   HCT 36.8 04/22/2024 1118   PLT 297.0 04/22/2024 1118   MCV 88.0 04/22/2024 1118   MCHC 33.2 04/22/2024 1118   RDW 15.7 (H) 04/22/2024 1118   LYMPHSABS 0.9 04/22/2024 1118   MONOABS  0.3 04/22/2024 1118   EOSABS 0.0 04/22/2024 1118   BASOSABS 0.1 04/22/2024 1118    CMP     Component Value Date/Time   NA 138 04/22/2024 1118   K 4.1 04/22/2024 1118   CL 103 04/22/2024 1118   CO2 29 04/22/2024 1118   GLUCOSE 90 04/22/2024 1118   BUN 9 04/22/2024 1118   CREATININE 0.69 04/22/2024 1118   CALCIUM 9.3 04/22/2024 1118   PROT 6.9 04/22/2024 1118   ALBUMIN 4.4 04/22/2024 1118   AST 10 04/22/2024 1118   ALT 6 04/22/2024 1118   ALKPHOS 44 04/22/2024 1118   BILITOT 0.6 04/22/2024 1118     Assessment/Plan:   Weight loss Abdominal pain Change in bowel habits Diarrhea Nausea  GERD Family history of colon cancer Decreased appetite Early satiety RUQ abdominal pain Patient seen today for multiple GI complaints.  Since November has had lower abdominal pain which has become more persistent since March.  Recent CT scan was unremarkable.  Has noticed a change in her bowel habits and has been having loose/watery stools with associated abdominal cramping, nausea, and dizziness.  Denies any blood in her stool or  melena, though stool is dark on iron and Pepto-Bismol when she takes it.  She reports persistent nausea without vomiting, decreased appetite/early satiety, and unintentional weight loss of 15 pounds over the last few months.  Has previously had an EGD in 2019 and reports that she had findings of GERD but was negative for celiac disease on biopsy.  No prior colonoscopy.  She is on Pepcid  for GERD, having had a reaction to PPIs in the past.  She has family history of colon cancer in her grandmother but no family history of IBD.  History of iron deficiency anemia for which she takes iron intermittently.   - Schedule diagnostic EGD/colonoscopy. I thoroughly discussed the procedure with the patient to include nature of the procedure, alternatives, benefits, and risks (including but not limited to bleeding, infection, perforation, anesthesia/cardiac/pulmonary complications). Patient verbalized understanding and gave verbal consent to proceed with procedure.  - Request past GI records - Check labs today: CBC, CMP, CRP, iron/ferritin, TTG, IgA, TSH, C. Diff, stool culture - Will order RUQ US  for further evaluation of the gallbladder based on notable tenderness to palpation on exam today.    Camie Furbish, PA-C  Gastroenterology 07/22/2024, 1:00 PM  Patient Care Team: Jason Leita Repine, FNP as PCP - General (Internal Medicine)

## 2024-07-22 NOTE — Patient Instructions (Signed)
 You have been scheduled for an abdominal ultrasound at [facility] (1st floor of hospital) on 07/27/24 at 9 am. Please arrive 30 minutes prior to your appointment for registration. Make certain not to have anything to eat or drink nothing after midnight. Should you need to reschedule your appointment, please contact radiology at [phone]. This test typically takes about 30 minutes to perform.  Your provider has requested that you go to the basement level for lab work before leaving today. Press B on the elevator. The lab is located at the first door on the left as you exit the elevator.   You have been scheduled for an endoscopy and colonoscopy. Please follow the written instructions given to you at your visit today.  If you use inhalers (even only as needed), please bring them with you on the day of your procedure.  DO NOT TAKE 7 DAYS PRIOR TO TEST- Trulicity (dulaglutide) Ozempic, Wegovy (semaglutide) Mounjaro (tirzepatide) Bydureon Bcise (exanatide extended release)  DO NOT TAKE 1 DAY PRIOR TO YOUR TEST Rybelsus (semaglutide) Adlyxin (lixisenatide) Victoza (liraglutide) Byetta (exanatide) ___________________________________________________________________________

## 2024-07-23 ENCOUNTER — Encounter: Payer: Self-pay | Admitting: Gastroenterology

## 2024-07-23 LAB — IGA: Immunoglobulin A: 335 mg/dL — ABNORMAL HIGH (ref 47–310)

## 2024-07-23 LAB — TISSUE TRANSGLUTAMINASE, IGA: (tTG) Ab, IgA: <1 U/mL

## 2024-07-25 ENCOUNTER — Ambulatory Visit: Payer: Self-pay | Admitting: Gastroenterology

## 2024-07-26 ENCOUNTER — Ambulatory Visit: Payer: Self-pay | Admitting: Family

## 2024-07-26 ENCOUNTER — Telehealth: Payer: Self-pay | Admitting: Gastroenterology

## 2024-07-26 NOTE — Telephone Encounter (Signed)
 Patient called and stated that she is confused on when she needs to have her US  of her abdomin due to her after visit summary stated that she has one for tomorrow but on her mychart it stated that it's for August the 29 th. Patient is requesting a call back. Please advise.

## 2024-07-26 NOTE — Telephone Encounter (Signed)
 Called the patient. No answer. Left message to confirm 08/27/24 as she sees in My Chart. Left telephone number of scheduling [phone] if she wants to move the appointment.

## 2024-07-29 ENCOUNTER — Other Ambulatory Visit

## 2024-07-29 ENCOUNTER — Emergency Department (HOSPITAL_BASED_OUTPATIENT_CLINIC_OR_DEPARTMENT_OTHER)
Admission: EM | Admit: 2024-07-29 | Discharge: 2024-07-29 | Disposition: A | Discharge status: Home / Self Care | Attending: Emergency Medicine | Admitting: Emergency Medicine

## 2024-07-29 ENCOUNTER — Other Ambulatory Visit: Payer: Self-pay

## 2024-07-29 ENCOUNTER — Emergency Department (HOSPITAL_BASED_OUTPATIENT_CLINIC_OR_DEPARTMENT_OTHER)

## 2024-07-29 DIAGNOSIS — R110 Nausea: Secondary | ICD-10-CM

## 2024-07-29 DIAGNOSIS — R634 Abnormal weight loss: Secondary | ICD-10-CM

## 2024-07-29 DIAGNOSIS — R194 Change in bowel habit: Secondary | ICD-10-CM | POA: Diagnosis not present

## 2024-07-29 DIAGNOSIS — R932 Abnormal findings on diagnostic imaging of liver and biliary tract: Secondary | ICD-10-CM | POA: Diagnosis not present

## 2024-07-29 DIAGNOSIS — I63522 Cerebral infarction due to unspecified occlusion or stenosis of left anterior cerebral artery: Secondary | ICD-10-CM | POA: Diagnosis not present

## 2024-07-29 DIAGNOSIS — R109 Unspecified abdominal pain: Secondary | ICD-10-CM

## 2024-07-29 DIAGNOSIS — R42 Dizziness and giddiness: Secondary | ICD-10-CM | POA: Diagnosis not present

## 2024-07-29 DIAGNOSIS — R197 Diarrhea, unspecified: Secondary | ICD-10-CM | POA: Diagnosis not present

## 2024-07-29 DIAGNOSIS — R930 Abnormal findings on diagnostic imaging of skull and head, not elsewhere classified: Secondary | ICD-10-CM | POA: Insufficient documentation

## 2024-07-29 DIAGNOSIS — R6881 Early satiety: Secondary | ICD-10-CM

## 2024-07-29 DIAGNOSIS — R1011 Right upper quadrant pain: Secondary | ICD-10-CM

## 2024-07-29 DIAGNOSIS — H7012 Chronic mastoiditis, left ear: Secondary | ICD-10-CM | POA: Insufficient documentation

## 2024-07-29 DIAGNOSIS — K769 Liver disease, unspecified: Secondary | ICD-10-CM | POA: Diagnosis not present

## 2024-07-29 DIAGNOSIS — H7492 Unspecified disorder of left middle ear and mastoid: Secondary | ICD-10-CM | POA: Diagnosis not present

## 2024-07-29 DIAGNOSIS — Z800 Family history of malignant neoplasm of digestive organs: Secondary | ICD-10-CM

## 2024-07-29 DIAGNOSIS — D72819 Decreased white blood cell count, unspecified: Secondary | ICD-10-CM | POA: Diagnosis not present

## 2024-07-29 DIAGNOSIS — R630 Anorexia: Secondary | ICD-10-CM

## 2024-07-29 DIAGNOSIS — K219 Gastro-esophageal reflux disease without esophagitis: Secondary | ICD-10-CM

## 2024-07-29 LAB — URINALYSIS, ROUTINE W REFLEX MICROSCOPIC
Bilirubin Urine: NEGATIVE
Glucose, UA: NEGATIVE mg/dL
Ketones, ur: NEGATIVE mg/dL
Nitrite: NEGATIVE
Protein, ur: NEGATIVE mg/dL
Specific Gravity, Urine: <=1.005 (ref 1.005–1.030)
pH: 5.5 (ref 5.0–8.0)

## 2024-07-29 LAB — COMPREHENSIVE METABOLIC PANEL WITH GFR
ALT: 6 U/L (ref 0–44)
AST: 14 U/L — ABNORMAL LOW (ref 15–41)
Albumin: 4.7 g/dL (ref 3.5–5.0)
Alkaline Phosphatase: 49 U/L (ref 38–126)
Anion gap: 12 (ref 5–15)
BUN: 6 mg/dL (ref 6–20)
CO2: 25 mmol/L (ref 22–32)
Calcium: 9.6 mg/dL (ref 8.9–10.3)
Chloride: 102 mmol/L (ref 98–111)
Creatinine, Ser: 0.79 mg/dL (ref 0.44–1.00)
GFR, Estimated: >60 mL/min (ref >60)
Glucose, Bld: 96 mg/dL (ref 70–99)
Potassium: 3.9 mmol/L (ref 3.5–5.1)
Sodium: 139 mmol/L (ref 135–145)
Total Bilirubin: 0.5 mg/dL (ref 0.0–1.2)
Total Protein: 7.5 g/dL (ref 6.5–8.1)

## 2024-07-29 LAB — CBC
HCT: 38.4 % (ref 36.0–46.0)
Hemoglobin: 12.3 g/dL (ref 12.0–15.0)
MCH: 28.5 pg (ref 26.0–34.0)
MCHC: 32 g/dL (ref 30.0–36.0)
MCV: 89.1 fL (ref 80.0–100.0)
Platelets: 271 K/uL (ref 150–400)
RBC: 4.31 MIL/uL (ref 3.87–5.11)
RDW: 13.7 % (ref 11.5–15.5)
WBC: 3.8 K/uL — ABNORMAL LOW (ref 4.0–10.5)
nRBC: 0 % (ref 0.0–0.2)

## 2024-07-29 LAB — URINALYSIS, MICROSCOPIC (REFLEX)

## 2024-07-29 LAB — LIPASE, BLOOD: Lipase: 29 U/L (ref 11–51)

## 2024-07-29 LAB — PREGNANCY, URINE: Preg Test, Ur: NEGATIVE

## 2024-07-29 LAB — MAGNESIUM: Magnesium: 2.1 mg/dL (ref 1.7–2.4)

## 2024-07-29 MED ORDER — PREDNISONE 20 MG PO TABS
20.0000 mg | ORAL_TABLET | Freq: Once | ORAL | Status: AC
  Administered 2024-07-29: 20 mg via ORAL
  Filled 2024-07-29: qty 1

## 2024-07-29 MED ORDER — PREDNISONE 20 MG PO TABS: 20.0000 mg | ORAL_TABLET | Freq: Every day | ORAL | 0 refills | Status: AC

## 2024-07-29 MED ORDER — LOPERAMIDE HCL 2 MG PO CAPS: 2.0000 mg | ORAL_CAPSULE | Freq: Four times a day (QID) | ORAL | 0 refills | Status: AC | PRN

## 2024-07-29 MED ORDER — SODIUM CHLORIDE 0.9 % IV BOLUS
1000.0000 mL | Freq: Once | INTRAVENOUS | Status: AC
  Administered 2024-07-29: 1000 mL via INTRAVENOUS

## 2024-07-29 MED ORDER — MECLIZINE HCL 25 MG PO TABS
50.0000 mg | ORAL_TABLET | Freq: Once | ORAL | Status: AC
  Administered 2024-07-29: 50 mg via ORAL
  Filled 2024-07-29: qty 2

## 2024-07-29 MED ORDER — MECLIZINE HCL 12.5 MG PO TABS: 12.5000 mg | ORAL_TABLET | Freq: Three times a day (TID) | ORAL | 0 refills | Status: AC | PRN

## 2024-07-29 MED ORDER — LOPERAMIDE HCL 2 MG PO CAPS
2.0000 mg | ORAL_CAPSULE | Freq: Once | ORAL | Status: AC
  Administered 2024-07-29: 2 mg via ORAL
  Filled 2024-07-29: qty 1

## 2024-07-29 NOTE — ED Notes (Signed)
 Patient transported to Ultrasound

## 2024-07-29 NOTE — ED Notes (Signed)
 Patient transported to CT

## 2024-07-29 NOTE — ED Triage Notes (Signed)
 Mid to lower intermittent 6/10 abdominal pain that started this AM with nausea and diarrhea. Denies vomiting. Diarrhea x3 days.

## 2024-07-29 NOTE — ED Provider Notes (Signed)
 [facility] Provider Note   CSN: [id number] Arrival date & time: 07/29/24  9057     Patient presents with: Abdominal Pain   Donna [name] is a 33 y.o. female.   Pt is a 33 yo female with pmh of GERD, palpations, anemia, and schizencephaly presenting for abdominal discomfort, nausea without vomiting, and non-bloody diarrhea. She has already established care with a GI specialist and has delivered a stool sample for testing yesterday. She is requesting a RUQ US  that was suggested by another provider for possible GB pathology.  Pt states she has also been having episodes of dizziness, lightheadedness, and feelings that she is going to pass out. She states these episodes are unrelated to diarrhea or bowel movement valsalva's. She has had these symptoms of dizziness/pre-syncope for over a year but they have been worsening. Chart review demonstrates she was seen previously for these symptoms with [facility] demonstrating concerns for possible small remote left occipital lobe infarct. 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 She has also seen ENT for these symptoms due to [facility] noting chronic mastoiditis and cleared after antibiotic and steroid treatment.  She states since this workup last year the symptoms are worsening.    The history is provided by the patient. No language interpreter was used.  Abdominal Pain Associated symptoms: diarrhea and nausea   Associated symptoms: no chest pain, no chills, no cough, no dysuria, no fever, no hematuria, no shortness of breath, no sore throat and no vomiting        Prior to Admission medications   Medication Sig Start Date End Date Taking? Authorizing Provider  loperamide  (IMODIUM ) 2 MG capsule Take 1 capsule (2 mg total) by mouth 4 (four) times daily as needed for diarrhea or loose stools. 07/29/24  Yes Elnor Hila P, DO  meclizine   (ANTIVERT ) 12.5 MG tablet Take 1 tablet (12.5 mg total) by mouth every 8 (eight) hours as needed for dizziness or nausea. 07/29/24  Yes Elnor Hila P, DO  bismuth subsalicylate (PEPTO BISMOL) 262 MG/15ML suspension Take 30 mLs by mouth every 6 (six) hours as needed.    [provider]  famotidine  (PEPCID ) 20 MG tablet Take 1 tablet (20 mg total) by mouth 2 (two) times daily. 04/22/24   Jason Leita Repine, FNP  ferrous sulfate 325 (65 FE) MG tablet Take 325 mg by mouth daily with breakfast.    [provider]  ibuprofen  (ADVIL ) 800 MG tablet Take 1 tablet (800 mg total) by mouth 3 (three) times daily with meals as needed for headache, moderate pain or cramping. 03/19/22   Cleatus Moccasin, MD  ondansetron  (ZOFRAN -ODT) 4 MG disintegrating tablet Take 4 mg by mouth every 8 (eight) hours as needed for nausea. 04/09/24   [provider]  Prenatal Vit-Fe Fumarate-FA (MULTIVITAMIN-PRENATAL) 27-0.8 MG TABS tablet Take 1 tablet by mouth daily at 12 noon.    [provider]    Allergies: Penicillin g and Sulfamethoxazole    Review of Systems  Constitutional:  Negative for chills and fever.  HENT:  Negative for ear pain and sore throat.   Eyes:  Negative for pain and visual disturbance.  Respiratory:  Negative for cough and shortness of breath.   Cardiovascular:  Negative for chest pain and palpitations.  Gastrointestinal:  Positive for abdominal pain, diarrhea and nausea. Negative for vomiting.  Genitourinary:  Negative for dysuria and hematuria.  Musculoskeletal:  Negative for arthralgias and back pain.  Skin:  Negative for color change and rash.  Neurological:  Positive for dizziness, light-headedness and numbness. Negative for seizures and syncope.  All other systems reviewed and are negative.   Updated Vital Signs BP 110/80   Pulse 78   Temp 98.1 F (36.7 C) (Oral)   Resp 18   Ht 5' 3 (1.6 m)   Wt 54.4 kg   LMP 07/23/2024 (Exact Date)   SpO2 99%   BMI  21.26 kg/m   Physical Exam Vitals and nursing note reviewed.  Constitutional:      General: She is not in acute distress.    Appearance: She is well-developed.  HENT:     Head: Normocephalic and atraumatic.  Eyes:     Conjunctiva/sclera: Conjunctivae normal.  Cardiovascular:     Rate and Rhythm: Normal rate and regular rhythm.     Heart sounds: No murmur heard. Pulmonary:     Effort: Pulmonary effort is normal. No respiratory distress.     Breath sounds: Normal breath sounds.  Abdominal:     Palpations: Abdomen is soft.     Tenderness: There is generalized abdominal tenderness. There is no guarding or rebound.  Musculoskeletal:        General: No swelling.     Cervical back: Neck supple.  Skin:    General: Skin is warm and dry.     Capillary Refill: Capillary refill takes less than 2 seconds.  Neurological:     Mental Status: She is alert.     GCS: GCS eye subscore is 4. GCS verbal subscore is 5. GCS motor subscore is 6.     Cranial Nerves: Cranial nerves 2-12 are intact.     Sensory: Sensation is intact.     Motor: Motor function is intact.     Comments: Pt can distinguish between deep pressure, light tough, sharp versus dull in bilateral upper and lower extremities. Her motor function is intact.   Psychiatric:        Mood and Affect: Mood normal.     (all labs ordered are listed, but only abnormal results are displayed) Labs Reviewed  COMPREHENSIVE METABOLIC PANEL WITH GFR - Abnormal; Notable for the following components:      Result Value   AST 14 (*)    All other components within normal limits  CBC - Abnormal; Notable for the following components:   WBC 3.8 (*)    All other components within normal limits  URINALYSIS, ROUTINE W REFLEX MICROSCOPIC - Abnormal; Notable for the following components:   Hgb urine dipstick TRACE (*)    Leukocytes,Ua SMALL (*)    All other components within normal limits  URINALYSIS, MICROSCOPIC (REFLEX) - Abnormal; Notable for the  following components:   Bacteria, UA FEW (*)    All other components within normal limits  LIPASE, BLOOD  PREGNANCY, URINE  MAGNESIUM    EKG: None  Radiology: No results found.   Procedures   Medications Ordered in the ED  sodium chloride  0.9 % bolus 1,000 mL (0 mLs Intravenous Stopped 07/29/24 1205)  meclizine  (ANTIVERT ) tablet 50 mg (50 mg Oral Given 07/29/24 1209)  loperamide  (IMODIUM ) capsule 2 mg (2 mg Oral Given 07/29/24 1358)  predniSONE  (DELTASONE ) tablet 20 mg (20 mg Oral Given 07/29/24 1358)  Medical Decision Making Amount and/or Complexity of Data Reviewed Labs: ordered. Radiology: ordered.  Risk Prescription drug management.   34 yo female with pmh of GERD, palpations, anemia, and schizencephaly presenting for abdominal discomfort, nausea without vomiting, and non-bloody diarrhea. On exam she is afebrile with stable vitals. Non-toxic appearing. Abdomen is soft without guarding or rigidity. RUQ US  suggested by outside provider. No RUQ tenderness on exam or CVA tenderness. I discussed my low concern for GB pathology given her exam. Pt still requesting US  at this time. RUQ US  completed and  demonstrates no concerning findings. Electrolytes panel ordered due to diarrheal symptoms and IVF given. Imodium  ordered.   Electrolyte panel wnl. Stable renal function. No concerning findings for sepsis. No anemia. Stable liver profile and lipase. Preg neg. CT abd/pelvis with contrast was ordered last week and demonstrated a corpus luteal cyst but was otherwise normal for a GI/diarrheal concern. No CT evidence of diverticulitis, tumors, or s/s chron's or UC. She has already delivered a stool specimen to her GI specialist. I will defer to their recommendations for further workup. She is scheduled for an EGD and colonoscopy.     In addition to these symptoms, she presents with concerns for episodes of dizziness, lightheadedness, and feelings that she is  going to pass out that she believes is unrelated to her diarrheal illness. Chart review demonstrates she was seen previously for these symptoms with CTH/MRI demonstrating concerns for possible left occipital infarct. 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 She states since this workup last year the symptoms are worsening. On exam she is neurovascularly intact.. Her CTH today is normal and without changes. Pt states she has had an MRI previously but I am unable to see the results myself in my chart review process via our epic and through our care everywhere system. I do recommended an MRI and repeat visit with neurology if her symptoms continue to progress but she is stable for dc at this time. I have a low suspicion for stroke like symptoms. I have considered MS but thought to be less likely due to her previously normal MRI and neurology clearance but stress the need for follow up.    Patient in no distress and overall condition improved here in the ED. Detailed discussions were had with the patient regarding current findings, and need for close f/u with PCP or on call doctor. The patient has been instructed to return immediately if the symptoms worsen in any way for re-evaluation. Patient verbalized understanding and is in agreement with current care plan. All questions answered prior to discharge.      Final diagnoses:  Vertigo  Mastoiditis, chronic, left-trace effusion  Abnormal CT of the head  Diarrhea, unspecified type  Disorder of left mastoid-trace effusion chronic    ED Discharge Orders          Ordered    loperamide  (IMODIUM ) 2 MG capsule  4 times daily PRN        07/29/24 1355    meclizine  (ANTIVERT ) 12.5 MG tablet  Every 8 hours PRN        07/29/24 1355    predniSONE  (DELTASONE ) 20 MG tablet  Daily        07/29/24 1355               Elnor Bernarda SQUIBB, DO 08/14/24 1242

## 2024-07-30 ENCOUNTER — Other Ambulatory Visit: Payer: Self-pay

## 2024-07-30 LAB — CLOSTRIDIUM DIFFICILE BY PCR: Toxigenic C. Difficile by PCR: POSITIVE — AB

## 2024-07-30 LAB — SPECIMEN STATUS REPORT

## 2024-07-30 MED ORDER — VANCOMYCIN HCL 125 MG PO CAPS: 125.0000 mg | ORAL_CAPSULE | Freq: Four times a day (QID) | ORAL | 0 refills | Status: AC

## 2024-08-02 ENCOUNTER — Telehealth: Payer: Self-pay | Admitting: Gastroenterology

## 2024-08-02 LAB — STOOL CULTURE: E coli, Shiga toxin Assay: NEGATIVE

## 2024-08-02 NOTE — Telephone Encounter (Signed)
 Inbound call from patient stated she was prescribed antibiotic Vancomycin  and wanted to know if it was okay for her to take Meclizine  for dizziness and nausea.  Requesting a call back from nurse   Please advise  Thank you

## 2024-08-02 NOTE — Telephone Encounter (Signed)
 The pt has been advised that she can take her meds that are prescribed as she has been taking

## 2024-08-04 DIAGNOSIS — F4322 Adjustment disorder with anxiety: Secondary | ICD-10-CM | POA: Diagnosis not present

## 2024-08-17 NOTE — Telephone Encounter (Signed)
 PT returning call to find out what her next steps after completing antibiotics. She is still having issues and would like to discuss them to see if she can still proceed with procedures on next week

## 2024-08-18 ENCOUNTER — Other Ambulatory Visit (INDEPENDENT_AMBULATORY_CARE_PROVIDER_SITE_OTHER)

## 2024-08-18 ENCOUNTER — Ambulatory Visit: Payer: Self-pay | Admitting: Gastroenterology

## 2024-08-18 ENCOUNTER — Other Ambulatory Visit: Payer: Self-pay

## 2024-08-18 ENCOUNTER — Ambulatory Visit (INDEPENDENT_AMBULATORY_CARE_PROVIDER_SITE_OTHER)
Admission: RE | Admit: 2024-08-18 | Discharge: 2024-08-18 | Disposition: A | Discharge status: Home / Self Care | Source: Ambulatory Visit | Attending: Gastroenterology | Admitting: Gastroenterology

## 2024-08-18 DIAGNOSIS — R110 Nausea: Secondary | ICD-10-CM

## 2024-08-18 DIAGNOSIS — R109 Unspecified abdominal pain: Secondary | ICD-10-CM

## 2024-08-18 DIAGNOSIS — F4322 Adjustment disorder with anxiety: Secondary | ICD-10-CM | POA: Diagnosis not present

## 2024-08-18 LAB — CBC WITH DIFFERENTIAL/PLATELET
Basophils Absolute: 0 K/uL (ref 0.0–0.1)
Basophils Relative: 1 % (ref 0.0–3.0)
Eosinophils Absolute: 0 K/uL (ref 0.0–0.7)
Eosinophils Relative: 0.4 % (ref 0.0–5.0)
HCT: 35.1 % — ABNORMAL LOW (ref 36.0–46.0)
Hemoglobin: 11.5 g/dL — ABNORMAL LOW (ref 12.0–15.0)
Lymphocytes Relative: 22.6 % (ref 12.0–46.0)
Lymphs Abs: 1.1 K/uL (ref 0.7–4.0)
MCHC: 32.8 g/dL (ref 30.0–36.0)
MCV: 86.8 fl (ref 78.0–100.0)
Monocytes Absolute: 0.3 K/uL (ref 0.1–1.0)
Monocytes Relative: 6.7 % (ref 3.0–12.0)
Neutro Abs: 3.3 K/uL (ref 1.4–7.7)
Neutrophils Relative %: 69.3 % (ref 43.0–77.0)
Platelets: 248 K/uL (ref 150.0–400.0)
RBC: 4.05 Mil/uL (ref 3.87–5.11)
RDW: 15.4 % (ref 11.5–15.5)
WBC: 4.7 K/uL (ref 4.0–10.5)

## 2024-08-18 LAB — COMPREHENSIVE METABOLIC PANEL WITH GFR
ALT: 9 U/L (ref 0–35)
AST: 11 U/L (ref 0–37)
Albumin: 4.1 g/dL (ref 3.5–5.2)
Alkaline Phosphatase: 43 U/L (ref 39–117)
BUN: 6 mg/dL (ref 6–23)
CO2: 27 meq/L (ref 19–32)
Calcium: 8.8 mg/dL (ref 8.4–10.5)
Chloride: 108 meq/L (ref 96–112)
Creatinine, Ser: 0.67 mg/dL (ref 0.40–1.20)
GFR: 115.99 mL/min (ref >60.00)
Glucose, Bld: 107 mg/dL — ABNORMAL HIGH (ref 70–99)
Potassium: 3.3 meq/L — ABNORMAL LOW (ref 3.5–5.1)
Sodium: 140 meq/L (ref 135–145)
Total Bilirubin: 0.8 mg/dL (ref 0.2–1.2)
Total Protein: 6.7 g/dL (ref 6.0–8.3)

## 2024-08-18 LAB — LIPASE: Lipase: 29 U/L (ref 11.0–59.0)

## 2024-08-18 NOTE — Telephone Encounter (Signed)
 Spoke with the patient. She is leaving work today because of increased heart rate, abdominal cramps/pain and nausea. She is concerned she may still have C-Diff infection. She took Imodium  this morning. She has taken it several times over the past 4 days. She does not remember having any bowel movements on Saturday, Sunday or Monday. She had a bowel movement yesterday but cannot tell me if it was diarrhea or soft formed. She is very nauseated. I have asked her to stop Imodium  and to push fluids. She is almost finished with vancomycin  for C Diff. Her EGD/colonoscopy is 08/25/24. The abd u/s sound is 08/27/24. Please advise.

## 2024-08-18 NOTE — Telephone Encounter (Signed)
 Patient clarified for me on the vancomycin . She completed the course on 08/09/24.  Coming in for labs and imaging.

## 2024-08-19 NOTE — Telephone Encounter (Signed)
 Spoke with the patient. She did not recall being advised not to take Imodium  apparently. She continued taking Imodium  and has also taken meclizine  for her nausea and dizziness. I have asked the patient to stop Imodium . She has not had any watery diarrhea stools over the last 24 hours. She passed 1 soft stool. Complains of increased heart rate when she has to go to the bathroom and she feels she is going to pass out. Agrees to stop Imodium . Push fluids by broth soup, popcicles, jello, watery fruit, etc. Okay to take meclizine  for dizziness and nausea as ordered. / / Are we canceling the colonoscopy? / There are no appointments available until 08/25/24

## 2024-08-20 ENCOUNTER — Other Ambulatory Visit: Payer: Self-pay

## 2024-08-20 MED ORDER — POTASSIUM CHLORIDE CRYS ER 20 MEQ PO TBCR
20.0000 meq | EXTENDED_RELEASE_TABLET | Freq: Every day | ORAL | 0 refills | Status: AC
Start: 2024-08-20 — End: ?

## 2024-08-20 NOTE — Telephone Encounter (Signed)
 Potassium prescription transmitted to [facility] as listed.

## 2024-08-23 ENCOUNTER — Emergency Department (HOSPITAL_COMMUNITY)
Admission: EM | Admit: 2024-08-23 | Discharge: 2024-08-23 | Disposition: A | Discharge status: Home / Self Care | Source: Ambulatory Visit | Attending: Emergency Medicine | Admitting: Emergency Medicine

## 2024-08-23 ENCOUNTER — Encounter (HOSPITAL_COMMUNITY): Payer: Self-pay

## 2024-08-23 ENCOUNTER — Other Ambulatory Visit: Payer: Self-pay

## 2024-08-23 DIAGNOSIS — A0472 Enterocolitis due to Clostridium difficile, not specified as recurrent: Secondary | ICD-10-CM | POA: Diagnosis not present

## 2024-08-23 DIAGNOSIS — R197 Diarrhea, unspecified: Secondary | ICD-10-CM | POA: Diagnosis not present

## 2024-08-23 DIAGNOSIS — E876 Hypokalemia: Secondary | ICD-10-CM | POA: Diagnosis not present

## 2024-08-23 DIAGNOSIS — D72819 Decreased white blood cell count, unspecified: Secondary | ICD-10-CM | POA: Diagnosis not present

## 2024-08-23 LAB — URINALYSIS, ROUTINE W REFLEX MICROSCOPIC
Bilirubin Urine: NEGATIVE
Glucose, UA: NEGATIVE mg/dL
Ketones, ur: NEGATIVE mg/dL
Leukocytes,Ua: NEGATIVE
Nitrite: NEGATIVE
Protein, ur: NEGATIVE mg/dL
Specific Gravity, Urine: 1.018 (ref 1.005–1.030)
pH: 5 (ref 5.0–8.0)

## 2024-08-23 LAB — COMPREHENSIVE METABOLIC PANEL WITH GFR
ALT: 11 U/L (ref 0–44)
AST: 20 U/L (ref 15–41)
Albumin: 3.9 g/dL (ref 3.5–5.0)
Alkaline Phosphatase: 46 U/L (ref 38–126)
Anion gap: 8 (ref 5–15)
BUN: <5 mg/dL — ABNORMAL LOW (ref 6–20)
CO2: 24 mmol/L (ref 22–32)
Calcium: 9.3 mg/dL (ref 8.9–10.3)
Chloride: 107 mmol/L (ref 98–111)
Creatinine, Ser: 0.69 mg/dL (ref 0.44–1.00)
GFR, Estimated: >60 mL/min (ref >60)
Glucose, Bld: 107 mg/dL — ABNORMAL HIGH (ref 70–99)
Potassium: 3.4 mmol/L — ABNORMAL LOW (ref 3.5–5.1)
Sodium: 139 mmol/L (ref 135–145)
Total Bilirubin: 0.6 mg/dL (ref 0.0–1.2)
Total Protein: 7.1 g/dL (ref 6.5–8.1)

## 2024-08-23 LAB — C DIFFICILE QUICK SCREEN W PCR REFLEX
C Diff antigen: POSITIVE — AB
C Diff interpretation: DETECTED
C Diff toxin: POSITIVE — AB

## 2024-08-23 LAB — CBC
HCT: 36.9 % (ref 36.0–46.0)
Hemoglobin: 11.5 g/dL — ABNORMAL LOW (ref 12.0–15.0)
MCH: 28.6 pg (ref 26.0–34.0)
MCHC: 31.2 g/dL (ref 30.0–36.0)
MCV: 91.8 fL (ref 80.0–100.0)
Platelets: 251 K/uL (ref 150–400)
RBC: 4.02 MIL/uL (ref 3.87–5.11)
RDW: 14.3 % (ref 11.5–15.5)
WBC: 3.6 K/uL — ABNORMAL LOW (ref 4.0–10.5)
nRBC: 0 % (ref 0.0–0.2)

## 2024-08-23 LAB — LIPASE, BLOOD: Lipase: 29 U/L (ref 11–51)

## 2024-08-23 LAB — HCG, SERUM, QUALITATIVE: Preg, Serum: NEGATIVE

## 2024-08-23 MED ORDER — DICYCLOMINE HCL 20 MG PO TABS: 20.0000 mg | ORAL_TABLET | Freq: Three times a day (TID) | ORAL | 0 refills | Status: DC | PRN

## 2024-08-23 MED ORDER — VANCOMYCIN HCL 250 MG PO CAPS: 250.0000 mg | ORAL_CAPSULE | Freq: Four times a day (QID) | ORAL | 0 refills | Status: AC

## 2024-08-23 NOTE — Telephone Encounter (Signed)
 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 difficile PCR while in the ER -- Restart vancomycin  oral 250 mg 4 times daily x 14 days -- Consider switch to Dificid as an outpatient if able to be covered -- Can try Florastor 250 mg twice daily but data limited -- Okay for dicyclomine  20 mg 3 times daily as needed crampy abdominal pain  I will alert Dr. Avram and his nurse Landry

## 2024-08-23 NOTE — Telephone Encounter (Signed)
 Spoke with the patient. 6 bowel movements so far today . She reports tenesmus now as well. She does not feel well. Agrees to go for evaluation.

## 2024-08-23 NOTE — Telephone Encounter (Signed)
 Patient calls. She stopped Imodium  as instructed. She had solid stool on Friday and did not feel well. No nausea or vomiting. Started menstruating. Cannot tell if there is any blood in the stool. Saturday she had a soft stool. Sunday she had stools x3 described as more watery. Today she is asking if she can take Pepto-Bismol Stating it's one of those days that I know I am going to need to go to the bathroom again. She reports 3 loose stools today. Please advise.

## 2024-08-23 NOTE — ED Provider Notes (Signed)
 [place] EMERGENCY DEPARTMENT AT [facility] Provider Note   CSN: [id number] Arrival date & time: 08/23/24  1608     Patient presents with: Diarrhea   Donna [name] is a 33 y.o. female.   33 year old female presenting with diarrhea.  Patient was diagnosed with C. difficile on 8/1, completed a 10-day course of p.o. vancomycin  on 8/11 as directed by her gastroenterologist Dr. Faustine RIGGERS, then on the 15th patient began to experience diarrhea again.  Patient describes loose stools at least once per day since the 15th, however yesterday she had approximately 3 episodes of diarrhea and today she has had 8 episodes of diarrhea prior to presenting to the emergency department.  She describes her diarrhea as watery, she does not believe there is any blood in her stool however she is on her menstrual cycle so it is hard to tell.  Reports occasional abdominal pain but none currently, she believes she had a low-grade fever on Friday/Saturday between 99-100.30F.  Last week she experienced an episode of tachycardia with heart rate in the 140s with associated abdominal pain around the time that she had an episode of diarrhea.  She was previously taking Imodium  in order to get through the day, but was instructed to discontinue this medication by her gastroenterologist.  She was scheduled to undergo colonoscopy/endoscopy next week, however when she contact her gastroenterologist in regard to her symptoms today they instructed her to come to the emergency department and they will likely plan to reschedule these appointments.   Diarrhea      Prior to Admission medications   Medication Sig Start Date End Date Taking? Authorizing Provider  bismuth subsalicylate (PEPTO BISMOL) 262 MG/15ML suspension Take 30 mLs by mouth every 6 (six) hours as needed.    [provider]  famotidine  (PEPCID ) 20 MG tablet Take 1 tablet (20 mg total) by mouth 2 (two) times daily. 04/22/24   Jason Leita Repine, FNP  ferrous sulfate 325 (65 FE) MG tablet Take 325 mg by mouth daily with breakfast.    [provider]  ibuprofen  (ADVIL ) 800 MG tablet Take 1 tablet (800 mg total) by mouth 3 (three) times daily with meals as needed for headache, moderate pain or cramping. 03/19/22   Cleatus Moccasin, MD  loperamide  (IMODIUM ) 2 MG capsule Take 1 capsule (2 mg total) by mouth 4 (four) times daily as needed for diarrhea or loose stools. 07/29/24   Elnor Hila P, DO  meclizine  (ANTIVERT ) 12.5 MG tablet Take 1 tablet (12.5 mg total) by mouth every 8 (eight) hours as needed for dizziness or nausea. 07/29/24   Elnor Hila P, DO  ondansetron  (ZOFRAN -ODT) 4 MG disintegrating tablet Take 4 mg by mouth every 8 (eight) hours as needed for nausea. 04/09/24   [provider]  potassium chloride  SA (KLOR-CON  M) 20 MEQ tablet Take 1 tablet (20 mEq total) by mouth daily. 08/20/24   Heinz, Sara E, PA-C  Prenatal Vit-Fe Fumarate-FA (MULTIVITAMIN-PRENATAL) 27-0.8 MG TABS tablet Take 1 tablet by mouth daily at 12 noon.    [provider]    Allergies: Penicillin g and Sulfamethoxazole    Review of Systems  Gastrointestinal:  Positive for diarrhea.    Updated Vital Signs  Vitals:   08/23/24 1618  BP: 118/81  Pulse: 84  Resp: 18  Temp: 98.9 F (37.2 C)  TempSrc: Oral  SpO2: 100%     Physical Exam Vitals and nursing note reviewed.  HENT:     Head: Normocephalic.  Eyes:     Extraocular Movements: Extraocular movements intact.  Cardiovascular:     Rate and Rhythm: Normal rate and regular rhythm.     Heart sounds: Normal heart sounds.  Pulmonary:     Effort: Pulmonary effort is normal.     Breath sounds: Normal breath sounds.  Abdominal:     Palpations: Abdomen is soft.     Tenderness: There is no abdominal tenderness. There is no guarding.  Musculoskeletal:     Cervical back: Normal range of motion.     Comments: Moves all extremities spontaneously without difficulty  Skin:     General: Skin is warm and dry.  Neurological:     Mental Status: She is alert and oriented to person, place, and time.     (all labs ordered are listed, but only abnormal results are displayed) Labs Reviewed  COMPREHENSIVE METABOLIC PANEL WITH GFR - Abnormal; Notable for the following components:      Result Value   Potassium 3.4 (*)    Glucose, Bld 107 (*)    BUN <5 (*)    All other components within normal limits  CBC - Abnormal; Notable for the following components:   WBC 3.6 (*)    Hemoglobin 11.5 (*)    All other components within normal limits  URINALYSIS, ROUTINE W REFLEX MICROSCOPIC - Abnormal; Notable for the following components:   APPearance HAZY (*)    Hgb urine dipstick LARGE (*)    Bacteria, UA RARE (*)    All other components within normal limits  C DIFFICILE QUICK SCREEN W PCR REFLEX    LIPASE, BLOOD  HCG, SERUM, QUALITATIVE    EKG: None  Radiology: No results found.   Procedures   Medications Ordered in the ED - No data to display                                  Medical Decision Making This patient presents to the ED for concern of diarrhea, this involves an extensive number of treatment options, and is a complaint that carries with it a high risk of complications and morbidity.  The differential diagnosis includes recurrent C. difficile, gastroenteritis, other infectious bacterial cause of diarrhea   Co morbidities that complicate the patient evaluation  Recent C.diff    Additional history obtained:  Additional history obtained from record review External records from outside source obtained and reviewed including communications from gastroenterology office and recent gastroenterology note   Lab Tests:  I Ordered, and personally interpreted labs.  The pertinent results include: CBC notable for leukopenia with white blood cell count of 3.6 with hemoglobin of 11.5, however this is largely stable as compared to previous.  CMP notable for  borderline hypokalemia with potassium of 3.4, BUN <5.  Lipase within normal limits.  Serum hCG negative.  Urinalysis notable for large RBCs with rare bacteria, I suspect that this is contaminant from vaginal bleeding.  C. difficile stool PCR pending at time of discharge.   Cardiac Monitoring: / EKG:  The patient was maintained on a cardiac monitor.  I personally viewed and interpreted the cardiac monitored which showed an underlying rhythm of: NSR   Consultations Obtained:  I requested consultation with the gastroenterology,  and discussed lab and imaging findings as well as pertinent plan - they recommend: I spoke with Dr. Albertus with [facility] as this patient is established with their service, he recommends recollecting a  c-diff PCR stool sample and have the patient restart p.o. vancomycin  250 mg 4 times daily for 14 days.  He advised that she can try over-the-counter Florastor 250mg  BID or Dicyclomine  20mg  TID PRN for abdominal cramping.    Problem List / ED Course / Critical interventions / Medication management I have reviewed the patients home medicines and have made adjustments as needed   Social Determinants of Health:  Depression   Test / Admission - Considered:  Physical exam is reassuring as above, vitals are within normal limits without fever or tachycardia.  I spoke with Dr. Albertus with gastroenterology, see above for their recommendations.  I suspect the patient's symptoms today are likely a recurrence of her C. difficile infection.  Will restart p.o. vancomycin  4 times daily x 14 days as directed by Dr. Albertus, will prescribe dicyclomine  to be used 3 times daily as needed.  I discussed these findings in depth with patient and her husband, she understands that she should not take Imodium  or Pepto-Bismol while being treated for C. difficile.  C. difficile stool PCR pending at time of discharge.  She is in agreement with this plan and is appropriate for discharge at this time,  return precautions discussed.    Amount and/or Complexity of Data Reviewed Labs: ordered.  Risk Prescription drug management.        Final diagnoses:  C. difficile diarrhea    ED Discharge Orders          Ordered    vancomycin  (VANCOCIN ) 250 MG capsule  4 times daily        08/23/24 1854    dicyclomine  (BENTYL ) 20 MG tablet  3 times daily PRN        08/23/24 1854               Glendia Rocky LOISE DEVONNA 08/23/24 1954    Dasie Faden, MD 08/25/24 1057

## 2024-08-23 NOTE — Discharge Instructions (Addendum)
 Call [facility] gastroenterology tomorrow to schedule follow-up.  Restart vancomycin , 1 tablet by mouth 4 times daily for 14 days.  Start dicyclomine , 1 tablet by mouth up to 3 times daily as needed for abdominal cramping. Start Florastor, this is a probiotic that may be found over-the-counter, use as directed. Return to the emergency department if your symptoms worsen.

## 2024-08-23 NOTE — ED Triage Notes (Signed)
 Pt has been having diarrhea since December. Tested positive for C-diff beginning of August. Today she has had 7 episodes of diarrhea today. Pt is now having dizziness and nausea as well.

## 2024-08-23 NOTE — ED Provider Triage Note (Signed)
 Emergency Medicine Provider Triage Evaluation Note  Donna [name] , a 33 y.o. female  was evaluated in triage.  Pt complains of ND, lightheadedness.  Nausea, Diarrhea. Diagnosed with c diff beginning of August and finished vancomycin  on 08/09/24. Has had watery diarrhea since Saturday. Normally has BM 4-5 days.  Also lightheadedness since 2019 that is intermittent but becoming more frequent. 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 Symptoms seemed related to migraine per neuro  Was evaluated on 07/29/24 for similar complaints  Review of Systems  Positive: See hpi Negative:   Physical Exam  BP 118/81 (BP Location: Left Arm)   Pulse 84   Temp 98.9 F (37.2 C) (Oral)   Resp 18   LMP 07/23/2024 (Exact Date)   SpO2 100%  Gen:   Awake, no distress   Resp:  Normal effort  MSK:   Moves extremities without difficulty  Other:    Medical Decision Making  Medically screening exam initiated at 4:26 PM.  Appropriate orders placed.  Donna [name] was informed that the remainder of the evaluation will be completed by another provider, this initial triage assessment does not replace that evaluation, and the importance of remaining in the ED until their evaluation is complete.  Labs ordered   Minnie Tinnie BRAVO, [place] 08/23/24 [id number]

## 2024-08-24 ENCOUNTER — Telehealth: Payer: Self-pay | Admitting: Gastroenterology

## 2024-08-24 NOTE — Telephone Encounter (Signed)
 Patient needs to contact us  with results of treatment for the C diff after finishing repeat vancomycin  - please let her know

## 2024-08-24 NOTE — Telephone Encounter (Signed)
 10/7 at 850 am appt made with Dr Avram Procedure has been cancelled    The patient has been notified of this information and all questions answered.

## 2024-08-24 NOTE — Telephone Encounter (Signed)
 The pt advised that the prescription was sent by the PA at the hospital.  She is going to call the pharmacy and have the prescription transferred to the pharmacy she prefers

## 2024-08-24 NOTE — Telephone Encounter (Signed)
 Inbound call from patient requesting we send Vancomycin  to [facility] at [facility] in [place]. Please advise.

## 2024-08-24 NOTE — Telephone Encounter (Signed)
 Patient seen in the ED last night and discharged. Has been restarted on 14 day course of Vancomycin  for C. Diff. Please cancel her procedures scheduled for tomorrow and schedule follow up office visit with Dr. Avram or Pod C APP in 6 weeks.

## 2024-08-24 NOTE — Telephone Encounter (Signed)
 Noted the pt has been advised

## 2024-08-25 ENCOUNTER — Encounter: Admitting: Internal Medicine

## 2024-08-27 ENCOUNTER — Ambulatory Visit (HOSPITAL_COMMUNITY)

## 2024-08-31 ENCOUNTER — Telehealth: Admitting: Family

## 2024-08-31 ENCOUNTER — Telehealth: Payer: Self-pay | Admitting: Gastroenterology

## 2024-08-31 ENCOUNTER — Ambulatory Visit: Payer: Self-pay

## 2024-08-31 DIAGNOSIS — F419 Anxiety disorder, unspecified: Secondary | ICD-10-CM

## 2024-08-31 DIAGNOSIS — R000 Tachycardia, unspecified: Secondary | ICD-10-CM | POA: Diagnosis not present

## 2024-08-31 DIAGNOSIS — A0472 Enterocolitis due to Clostridium difficile, not specified as recurrent: Secondary | ICD-10-CM | POA: Insufficient documentation

## 2024-08-31 MED ORDER — SERTRALINE HCL 50 MG PO TABS: ORAL_TABLET | ORAL | 0 refills | Status: DC

## 2024-08-31 NOTE — Telephone Encounter (Signed)
 FYI Only or Action Required?: Action required by provider: request for appointment.  Patient was last seen in primary care on 04/22/2024 by Jason Leita Repine, FNP.  Called Nurse Triage reporting Anxiety.  Symptoms began a week ago.  Interventions attempted: Nothing.  Symptoms are: gradually worsening. Pt. Has had anxiety that is worsening, does not want it to affect her work. Requests VV. Will have dizziness and elevated heart rate.  Triage Disposition: See PCP When Office is Open (Within 3 Days)  Patient/caregiver understands and will follow disposition?: Yes    Copied from CRM #[id number]. Topic: Clinical - Red Word Triage >> Aug 31, 2024  9:55 AM Terri MATSU wrote: Red Word that prompted transfer to Nurse Triage: Patient stated she may be having panic attacks. She is experiencing dizziness, nausea, and her heart elevating. Answer Assessment - Initial Assessment Questions 1. CONCERN: Did anything happen that prompted you to call today?      anxiety 2. ANXIETY SYMPTOMS: Can you describe how you (your loved one; patient) have been feeling? (e.g., tense, restless, panicky, anxious, keyed up, overwhelmed, sense of impending doom).      Elevated heart rate 3. ONSET: How long have you been feeling this way? (e.g., hours, days, weeks)     months 4. SEVERITY: How would you rate the level of anxiety? (e.g., 0 - 10; or mild, moderate, severe).     moderate 5. FUNCTIONAL IMPAIRMENT: How have these feelings affected your ability to do daily activities? Have you had more difficulty than usual doing your normal daily activities? (e.g., getting better, same, worse; self-care, school, work, interactions)     yes 6. HISTORY: Have you felt this way before? Have you ever been diagnosed with an anxiety problem in the past? (e.g., generalized anxiety disorder, panic attacks, PTSD). If Yes, ask: How was this problem treated? (e.g., medicines, counseling, etc.)     no 7. RISK OF HARM -  SUICIDAL IDEATION: Do you ever have thoughts of hurting or killing yourself? If Yes, ask:  Do you have these feelings now? Do you have a plan on how you would do this?     no 8. TREATMENT:  What has been done so far to treat this anxiety? (e.g., medicines, relaxation strategies). What has helped?     no 9. THERAPIST: Do you have a counselor or therapist? If Yes, ask: What is their name?     no 10. POTENTIAL TRIGGERS: Do you drink caffeinated beverages (e.g., coffee, colas, teas), and how much daily? Do you drink alcohol or use any drugs? Have you started any new medicines recently?       no 11. PATIENT SUPPORT: Who is with you now? Who do you live with? Do you have family or friends who you can talk to?        family 79. OTHER SYMPTOMS: Do you have any other symptoms? (e.g., feeling depressed, trouble concentrating, trouble sleeping, trouble breathing, palpitations or fast heartbeat, chest pain, sweating, nausea, or diarrhea)       overwhelmed 13. PREGNANCY: Is there any chance you are pregnant? When was your last menstrual period?       no  Protocols used: Anxiety and Panic Attack-A-AH  Reason for Disposition  MODERATE anxiety (e.g., persistent or frequent anxiety symptoms; interferes with sleep, school, or work)  Answer Assessment - Initial Assessment Questions 1. CONCERN: Did anything happen that prompted you to call today?      anxiety 2. ANXIETY SYMPTOMS: Can you describe how you (your  loved one; patient) have been feeling? (e.g., tense, restless, panicky, anxious, keyed up, overwhelmed, sense of impending doom).      Elevated heart rate 3. ONSET: How long have you been feeling this way? (e.g., hours, days, weeks)     months 4. SEVERITY: How would you rate the level of anxiety? (e.g., 0 - 10; or mild, moderate, severe).     moderate 5. FUNCTIONAL IMPAIRMENT: How have these feelings affected your ability to do daily activities? Have you had  more difficulty than usual doing your normal daily activities? (e.g., getting better, same, worse; self-care, school, work, interactions)     yes 6. HISTORY: Have you felt this way before? Have you ever been diagnosed with an anxiety problem in the past? (e.g., generalized anxiety disorder, panic attacks, PTSD). If Yes, ask: How was this problem treated? (e.g., medicines, counseling, etc.)     no 7. RISK OF HARM - SUICIDAL IDEATION: Do you ever have thoughts of hurting or killing yourself? If Yes, ask:  Do you have these feelings now? Do you have a plan on how you would do this?     no 8. TREATMENT:  What has been done so far to treat this anxiety? (e.g., medicines, relaxation strategies). What has helped?     no 9. THERAPIST: Do you have a counselor or therapist? If Yes, ask: What is their name?     no 10. POTENTIAL TRIGGERS: Do you drink caffeinated beverages (e.g., coffee, colas, teas), and how much daily? Do you drink alcohol or use any drugs? Have you started any new medicines recently?       no 11. PATIENT SUPPORT: Who is with you now? Who do you live with? Do you have family or friends who you can talk to?        family 34. OTHER SYMPTOMS: Do you have any other symptoms? (e.g., feeling depressed, trouble concentrating, trouble sleeping, trouble breathing, palpitations or fast heartbeat, chest pain, sweating, nausea, or diarrhea)       overwhelmed 13. PREGNANCY: Is there any chance you are pregnant? When was your last menstrual period?       no  Protocols used: Anxiety and Panic Attack-A-AH

## 2024-08-31 NOTE — Assessment & Plan Note (Addendum)
 Could be related to anxiety, but POTS is another possibility. I would like to bring her back for an in person visit for orthostatics, thyroid  function testing and EKG.  She states that she saw cardiology > 5 years ago and had a heart monitor which she states showed palpitations. Discussed increasing sodium intake with salty snacks and electrolyte drinks to see if this helps her symptoms.

## 2024-08-31 NOTE — Telephone Encounter (Signed)
 Spoke with pt. Advised pt that anxiety medication is not prescribed and managed by our providers. She will need to reach out to her PCP. Verbalized understanding.

## 2024-08-31 NOTE — Telephone Encounter (Signed)
 Inbound call from patient requesting to speak to nurse. She states that she is dizzy and nauseas all the time and has started to have panic attacks and is wanting to discuss if she can get medication to help keep her calm. Please advise.

## 2024-08-31 NOTE — Assessment & Plan Note (Signed)
 Uncontrolled.  Will initiate sertraline  50mg  for both anxiety and OCD. I instructed pt to start 1/2 tablet once daily for 1 week and then increase to a full tablet once daily on week two as tolerated.  We discussed common side effects such as nausea, drowsiness. Pt verbalizes understanding.  Plan follow up in 1 month to evaluate progress.  She hopes to only be on this medication temporarily but we will continue to monitor her response.

## 2024-08-31 NOTE — Assessment & Plan Note (Signed)
 Being treated with PO vancomycin . Has follow up scheduled with GI.

## 2024-08-31 NOTE — Patient Instructions (Signed)
 VISIT SUMMARY:  During your visit, we discussed your ongoing symptoms of dizziness, nausea, and increased heart rate. We reviewed your recent history of C. difficile infection and your current treatment plan. We also addressed your episodes of dizziness and increased heart rate, which may be related to POTS, and your anxiety and OCD symptoms.  YOUR PLAN:  CLOSTRIDIOIDES DIFFICILE INFECTION: Your symptoms returned after the initial antibiotic course, and you are currently on an extended antibiotic treatment. -Complete the current 14-day course of antibiotics. -Avoid taking Imodium  as it may worsen your symptoms.  RECURRENT DIZZINESS, TACHYCARDIA, AND PRESYNCOPE (POSSIBLE POTS): Your symptoms suggest possible POTS, with other possibilities including anxiety and vertigo. -Increase your sodium intake with salty snacks and electrolyte drinks. -We will perform in-office tests for vertigo and POTS during your follow-up visit.  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 -Start taking sertraline , beginning with half a tablet at bedtime for one week, then increase to a full tablet if tolerated. -Schedule a follow-up appointment in two weeks to assess your response to sertraline  and perform additional tests.

## 2024-08-31 NOTE — Progress Notes (Signed)
 MyChart Video Visit    Virtual Visit via Video Note    Patient location: Home. Patient and provider in visit Provider location: Office  I discussed the limitations of evaluation and management by telemedicine and the availability of in person appointments. The patient expressed understanding and agreed to proceed.  Visit Date: 08/31/2024  Today's healthcare provider: Eleanor GORMAN Ponto, NP     Subjective:    Patient ID: Donna [name], female    DOB: January 25, 1991, 33 y.o.   MRN: [id number]  No chief complaint on file.   HPI  Donna [name] is a 33 year old female who presents with dizziness, nausea, anxiety and increased heart rate.  Since December, she has experienced dizziness, nausea, and loose stools, with episodes increasing in frequency and severity since March. Episodes now last two to three days. In March, fluid was found behind her ears while at Urgent care, and she was treated with prednisone , antacids, and medication for nausea and dizziness. In April, she experienced another episode related to elevator motion and was given Zofran .  In August, she tested positive for C. difficile and completed a ten-day course of antibiotics on August 11th. Symptoms returned on August 15th, with a significant episode on August 20th, where her heart rate increased to 140 beats per minute. On August 25th, she was unable to work due to severe symptoms. She presented to the ED where she was diagnosed with C Diff again. She was treated with PO vancomycin  for 14 days which she is still continuing. f  She experiences episodes of increased heart rate, dizziness, and nausea, particularly at work, with heart rates reaching 120 beats per minute. She fears passing out and describes feeling 'miserable'. A history of palpitations was confirmed by a cardiologist in 2019, and stress exacerbates her symptoms.  She is currently taking antibiotics, Imodium , meclizine , and other medications. She has  follow-up appointments with a GI specialist and a neurologist. She has anxiety and OCD tendencies, exacerbated by recent health issues and past experiences with mice in her home. She is seeing a therapist bi-weekly and is concerned about the impact of her symptoms on her ability to work.  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[facility])     Past Surgical History:  Procedure Laterality Date   BREAST BIOPSY Left 04/25/2023   US  LT BREAST BX W LOC DEV 1ST LESION IMG BX SPEC US  GUIDE 04/25/2023 [facility] MAMMOGRAPHY   DILATATION & CURETTAGE/HYSTEROSCOPY WITH MYOSURE N/A 03/19/2022   Procedure: DILATATION & CURETTAGE/HYSTEROSCOPY WITH MYOSURE;  Surgeon: Cleatus Moccasin, MD;  Location: [facility];  Service: Gynecology;  Laterality: N/A;   EYE SURGERY Bilateral    UPPER GASTROINTESTINAL ENDOSCOPY  2019    Family History  Problem Relation Age of Onset   Ovarian cysts Mother    Anemia Mother    GER disease Father    Diabetes Brother        type 1   Colon cancer Maternal Grandmother    Prostate cancer Maternal Grandfather    Multiple sclerosis Paternal Grandmother    Cancer Paternal Grandfather    Angina Paternal Grandfather    Hypertension Neg Hx     Social History   Socioeconomic History   Marital status: Married    Spouse name: Not on file   Number of children: 0   Years of education: Not on file   Highest  education level: Bachelor's degree (e.g., BA, AB, BS)  Occupational History    Comment: [employer]  Tobacco Use   Smoking status: Never   Smokeless tobacco: Never  Vaping Use   Vaping status: Never Used  Substance and Sexual Activity   Alcohol use: Yes    Comment: occasionally   Drug use: Never   Sexual activity: Yes    Partners: Male    Birth control/protection: Condom  Other Topics Concern   Not on file  Social History Narrative   Not on file   Social Drivers of Health   Financial  Resource Strain: Low Risk  (08/31/2024)   Overall Financial Resource Strain (CARDIA)    Difficulty of Paying Living Expenses: Not hard at all  Food Insecurity: No Food Insecurity (08/31/2024)   Hunger Vital Sign    Worried About Running Out of Food in the Last Year: Never true    Ran Out of Food in the Last Year: Never true  Transportation Needs: No Transportation Needs (08/31/2024)   PRAPARE - Administrator, Civil Service (Medical): No    Lack of Transportation (Non-Medical): No  Physical Activity: Inactive (08/31/2024)   Exercise Vital Sign    Days of Exercise per Week: 1 day    Minutes of Exercise per Session: 0 min  Stress: Stress Concern Present (08/31/2024)   [employer] of Occupational Health - Occupational Stress Questionnaire    Feeling of Stress: Very much  Social Connections: Moderately Isolated (08/31/2024)   Social Connection and Isolation Panel    Frequency of Communication with Friends and Family: More than three times a week    Frequency of Social Gatherings with Friends and Family: Never    Attends Religious Services: Never    Database administrator or Organizations: No    Attends Engineer, structural: Not on file    Marital Status: Married  Intimate Partner Violence: Unknown (04/05/2022)   Received from [facility]   HITS    Physically Hurt: Not on file    Insult or Talk Down To: Not on file    Threaten Physical Harm: Not on file    Scream or Curse: Not on file    Outpatient Medications Prior to Visit  Medication Sig Dispense Refill   dicyclomine  (BENTYL ) 20 MG tablet Take 1 tablet (20 mg total) by mouth 3 (three) times daily as needed for spasms. 20 tablet 0   famotidine  (PEPCID ) 20 MG tablet Take 1 tablet (20 mg total) by mouth 2 (two) times daily. 60 tablet 0   ferrous sulfate 325 (65 FE) MG tablet Take 325 mg by mouth daily with breakfast.     ibuprofen  (ADVIL ) 800 MG tablet Take 1 tablet (800 mg total) by mouth 3 (three) times daily with  meals as needed for headache, moderate pain or cramping. 60 tablet 1   loperamide  (IMODIUM ) 2 MG capsule Take 1 capsule (2 mg total) by mouth 4 (four) times daily as needed for diarrhea or loose stools. 12 capsule 0   meclizine  (ANTIVERT ) 12.5 MG tablet Take 1 tablet (12.5 mg total) by mouth every 8 (eight) hours as needed for dizziness or nausea. 30 tablet 0   potassium chloride  SA (KLOR-CON  M) 20 MEQ tablet Take 1 tablet (20 mEq total) by mouth daily. 30 tablet 0   Prenatal Vit-Fe Fumarate-FA (MULTIVITAMIN-PRENATAL) 27-0.8 MG TABS tablet Take 1 tablet by mouth daily at 12 noon.     vancomycin  (VANCOCIN ) 250 MG capsule Take 1 capsule (  250 mg total) by mouth 4 (four) times daily for 14 days. 56 capsule 0   bismuth subsalicylate (PEPTO BISMOL) 262 MG/15ML suspension Take 30 mLs by mouth every 6 (six) hours as needed.     ondansetron  (ZOFRAN -ODT) 4 MG disintegrating tablet Take 4 mg by mouth every 8 (eight) hours as needed for nausea.     No facility-administered medications prior to visit.    Allergies  Allergen Reactions   Penicillin G Rash   Sulfamethoxazole Rash    ROS See HPI    Objective:    Physical Exam  There were no vitals taken for this visit. Wt Readings from Last 3 Encounters:  07/29/24 120 lb (54.4 kg)  07/22/24 120 lb 2 oz (54.5 kg)  07/08/24 120 lb 6.4 oz (54.6 kg)    Gen: Awake, alert, no acute distress Resp: Breathing is even and non-labored Psych: moderately anxious Neuro: Alert and Oriented x 3, + facial symmetry, speech is clear.     Assessment & Plan:   Problem List Items Addressed This Visit       Unprioritized   Tachycardia   Could be related to anxiety, but POTS is another possibility. I would like to bring her back for an in person visit for orthostatics, thyroid  function testing and EKG.  She states that she saw cardiology > 5 years ago and had a heart monitor which she states showed palpitations. Discussed increasing sodium intake with salty  snacks and electrolyte drinks to see if this helps her symptoms.      C. difficile colitis   Being treated with PO vancomycin . Has follow up scheduled with GI.       Anxiety - Primary   Uncontrolled.  Will initiate sertraline  50mg  for both anxiety and OCD. I instructed pt to start 1/2 tablet once daily for 1 week and then increase to a full tablet once daily on week two as tolerated.  We discussed common side effects such as nausea, drowsiness. Pt verbalizes understanding.  Plan follow up in 1 month to evaluate progress.  She hopes to only be on this medication temporarily but we will continue to monitor her response.       Relevant Medications   sertraline  (ZOLOFT ) 50 MG tablet   I personally spent a total of 30 minutes in the care of the patient today including preparing to see the patient, counseling and educating, and placing orders.  I have discontinued Adithi Bress's bismuth subsalicylate and ondansetron . I am also having her start on sertraline . Additionally, I am having her maintain her multivitamin-prenatal, ferrous sulfate, ibuprofen , famotidine , loperamide , meclizine , potassium chloride  SA, vancomycin , and dicyclomine .  Meds ordered this encounter  Medications   sertraline  (ZOLOFT ) 50 MG tablet    Sig: 1/2 tab once daily for 1 week, then increase to a full tab once daily on week two    Dispense:  30 tablet    Refill:  0    Supervising Provider:   DOMENICA BLACKBIRD A [[id number]]    I discussed the assessment and treatment plan with the patient. The patient was provided an opportunity to ask questions and all were answered. The patient agreed with the plan and demonstrated an understanding of the instructions.   The patient was advised to call back or seek an in-person evaluation if the symptoms worsen or if the condition fails to improve as anticipated.   Eleanor GORMAN Ponto, NP [facility] [phone] (phone) [phone]  (fax)  [facility]

## 2024-09-01 ENCOUNTER — Other Ambulatory Visit (INDEPENDENT_AMBULATORY_CARE_PROVIDER_SITE_OTHER)

## 2024-09-01 ENCOUNTER — Other Ambulatory Visit: Payer: Self-pay

## 2024-09-01 DIAGNOSIS — K921 Melena: Secondary | ICD-10-CM

## 2024-09-01 DIAGNOSIS — F4322 Adjustment disorder with anxiety: Secondary | ICD-10-CM | POA: Diagnosis not present

## 2024-09-01 LAB — CBC WITH DIFFERENTIAL/PLATELET
Basophils Absolute: 0.1 K/uL (ref 0.0–0.1)
Basophils Relative: 1 % (ref 0.0–3.0)
Eosinophils Absolute: 0 K/uL (ref 0.0–0.7)
Eosinophils Relative: 0.1 % (ref 0.0–5.0)
HCT: 37.4 % (ref 36.0–46.0)
Hemoglobin: 12.2 g/dL (ref 12.0–15.0)
Lymphocytes Relative: 24.1 % (ref 12.0–46.0)
Lymphs Abs: 1.4 K/uL (ref 0.7–4.0)
MCHC: 32.7 g/dL (ref 30.0–36.0)
MCV: 86.8 fl (ref 78.0–100.0)
Monocytes Absolute: 0.3 K/uL (ref 0.1–1.0)
Monocytes Relative: 5.3 % (ref 3.0–12.0)
Neutro Abs: 4.1 K/uL (ref 1.4–7.7)
Neutrophils Relative %: 69.5 % (ref 43.0–77.0)
Platelets: 349 K/uL (ref 150.0–400.0)
RBC: 4.31 Mil/uL (ref 3.87–5.11)
RDW: 15.4 % (ref 11.5–15.5)
WBC: 5.9 K/uL (ref 4.0–10.5)

## 2024-09-01 NOTE — Telephone Encounter (Signed)
 Pt called clinic, she states that she has been feeling bad with dizziness and nausea for about 2 days. She reports that today she has noted her stools have been black in color. Stools described as solid and sausage like. Pt has not taken Pepto since the last week of July. She has not been taking her iron pill regularly. Pt originally tested positive for C-diff on 07/30/2024 and is on her second round of abts for treatment. Pt is concerned that she may have a GI bleed due to black stools.

## 2024-09-01 NOTE — Telephone Encounter (Signed)
 Orders placed. Pt notified to come in for blood work. Hemoccult cards left at second floor main desk. Pt instructed on where to pick these up from and we discussed how to collect the sample. Verbalized understanding.

## 2024-09-02 ENCOUNTER — Ambulatory Visit: Payer: Self-pay | Admitting: Physician Assistant

## 2024-09-02 NOTE — Progress Notes (Signed)
 Normal CBC.  Delon Failing, PA-C

## 2024-09-03 ENCOUNTER — Telehealth: Payer: Self-pay

## 2024-09-03 NOTE — Telephone Encounter (Signed)
 Copied from CRM [id number]. Topic: General - Other >> Sep 03, 2024  2:57 PM Thersia C wrote: Reason for CRM: Patient called in regarding needing a nurse to call her back regarding paperwork

## 2024-09-03 NOTE — Telephone Encounter (Signed)
 Returned call to pt. Pt had several questions regarding hemoccult cards and the timing with which to do them. All questions answered and pt verbalized understanding of instructions.

## 2024-09-03 NOTE — Telephone Encounter (Signed)
 Patient called requesting a call to discuss questions she has regarding Hemoccult cards. Please advise, thank you

## 2024-09-06 ENCOUNTER — Telehealth: Payer: Self-pay | Admitting: Family

## 2024-09-06 NOTE — Telephone Encounter (Signed)
 Duplicate message

## 2024-09-06 NOTE — Telephone Encounter (Signed)
 Pt dropped off papers that need to be filled out for her medical leave. She stated to call her when they are completed so she can decide if she wants to pick them up or have them faxed.

## 2024-09-06 NOTE — Telephone Encounter (Signed)
 Copied from CRM [id number]. Topic: General - Other >> Sep 03, 2024  2:57 PM Thersia C wrote: Reason for CRM: Patient called in regarding needing a nurse to call her back regarding paperwork

## 2024-09-07 NOTE — Telephone Encounter (Signed)
 Duplicate

## 2024-09-07 NOTE — Progress Notes (Unsigned)
 [name] [address] [place] [address] Phone: [phone] Subjective:   Donna [name], am serving as a scribe for Dr. Arthea Claudene.  I'm seeing this patient by the request  of:  Jason Leita Repine, FNP  CC: Back and neck pain follow-up  YEP:[name]  Donna [name] is a 33 y.o. female coming in with complaint of back and neck pain. OMT on 07/08/2024. Patient states that she has taken 2 rounds of antibiotics and she still feels bad. No new back symptoms.          Reviewed prior external information including notes and imaging from previsou exam, outside providers and external EMR if available.   As well as notes that were available from care everywhere and other healthcare systems.  Past medical history, social, surgical and family history all reviewed in electronic medical record.  No pertanent information unless stated regarding to the chief complaint.   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(HCC)     Allergies  Allergen Reactions   Penicillin G Rash   Sulfamethoxazole Rash     Review of Systems:  No headache, visual changes, nausea, vomiting, diarrhea, constipation, dizziness, abdominal pain, skin rash, fevers, chills, night sweats, weight loss, swollen lymph nodes, body aches, joint swelling, chest pain, shortness of breath, mood changes. POSITIVE muscle aches  Objective  Blood pressure 118/84, pulse 84, height 5' 3 (1.6 m), weight 117 lb (53.1 kg), SpO2 99%.   General: No apparent distress alert and oriented x3 mood and affect normal, dressed appropriately.  HEENT: Pupils equal, extraocular movements intact  Respiratory: Patient's speak in full sentences and does not appear short of breath  Cardiovascular: No lower extremity edema, non tender, no erythema  Gait MSK:  Back does have scoliosis noted.  Tenderness to palpation today in multiple  different areas.  Patient does have tightness more on the thoracolumbar area as well as on the sacroiliac joint.  Osteopathic findings  C3 flexed rotated and side bent right C6 flexed rotated and side bent left T3 extended rotated and side bent right inhaled rib T5 extended rotated and side bent left T7 extended rotated and side bent left L3 flexed rotated and side bent right Sacrum right on right       Assessment and Plan:  Thoracogenic scoliosis of thoracolumbar region Chronic problem, patient has lost some weight, has been sick recently and has not been able to workout which I do think is usually beneficial.  Discussed with patient that icing regimen and home exercises, discussed which activities to do and which ones to avoid.  Increase activity slowly otherwise.  Follow-up with me again 6 weeks.  Did discuss with patient being sick about what else to potentially do for this individual.  We discussed different laboratory workup potentially.  He is seeing primary care next week.    Nonallopathic problems  Decision today to treat with OMT was based on Physical Exam  After verbal consent patient was treated with HVLA, ME, FPR techniques in cervical, rib, thoracic, lumbar, and sacral  areas  Patient tolerated the procedure well with improvement in symptoms  Patient given exercises, stretches and lifestyle modifications  See medications in patient instructions if given  Patient will follow up in 4-8 weeks     The above documentation has been reviewed and is accurate and complete Kynslee Baham M Makhia Vosler, DO  Note: This dictation was prepared with Dragon dictation along with smaller phrase technology. Any transcriptional errors that result from this process are unintentional.

## 2024-09-07 NOTE — Telephone Encounter (Signed)
 Spoke with pt, pt states the FMLA is for everything going on as far as the dizziness and nausea, I was advised pt my manager to see about getting FMLA because some days I have to leave early or miss work pt states she would like to cover any missed days and is trying to get STD to stay out of work the rest of this month and go back to work in November. Advised pt a message would be sent back to PCP.

## 2024-09-07 NOTE — Telephone Encounter (Signed)
 I have not seen her since April 2025; what is she needing FMLA for? What timeline are we covering? She may need an appointment before this can be completed.

## 2024-09-08 NOTE — Telephone Encounter (Signed)
 Mychart message sent to pt.

## 2024-09-09 ENCOUNTER — Encounter: Payer: Self-pay | Admitting: Family Medicine

## 2024-09-09 ENCOUNTER — Ambulatory Visit: Admitting: Family Medicine

## 2024-09-09 VITALS — BP 118/84 | HR 84 | Ht 63.0 in | Wt 117.0 lb

## 2024-09-09 DIAGNOSIS — M9903 Segmental and somatic dysfunction of lumbar region: Secondary | ICD-10-CM

## 2024-09-09 DIAGNOSIS — M4135 Thoracogenic scoliosis, thoracolumbar region: Secondary | ICD-10-CM

## 2024-09-09 DIAGNOSIS — M9904 Segmental and somatic dysfunction of sacral region: Secondary | ICD-10-CM

## 2024-09-09 DIAGNOSIS — R5383 Other fatigue: Secondary | ICD-10-CM | POA: Diagnosis not present

## 2024-09-09 DIAGNOSIS — M9902 Segmental and somatic dysfunction of thoracic region: Secondary | ICD-10-CM | POA: Diagnosis not present

## 2024-09-09 DIAGNOSIS — M9901 Segmental and somatic dysfunction of cervical region: Secondary | ICD-10-CM

## 2024-09-09 DIAGNOSIS — M9908 Segmental and somatic dysfunction of rib cage: Secondary | ICD-10-CM

## 2024-09-09 NOTE — Assessment & Plan Note (Signed)
 Chronic problem, patient has lost some weight, has been sick recently and has not been able to workout which I do think is usually beneficial.  Discussed with patient that icing regimen and home exercises, discussed which activities to do and which ones to avoid.  Increase activity slowly otherwise.  Follow-up with me again 6 weeks.  Did discuss with patient being sick about what else to potentially do for this individual.  We discussed different laboratory workup potentially.  He is seeing primary care next week.

## 2024-09-09 NOTE — Patient Instructions (Signed)
 See me in 6 weeks

## 2024-09-10 NOTE — Telephone Encounter (Signed)
 Spoke to pt, pt states her dates are August 15th to November 3rd.

## 2024-09-12 ENCOUNTER — Telehealth: Payer: Self-pay | Admitting: Family

## 2024-09-12 NOTE — Telephone Encounter (Signed)
 See mychart.

## 2024-09-14 ENCOUNTER — Telehealth: Payer: Self-pay | Admitting: Family

## 2024-09-14 NOTE — Telephone Encounter (Signed)
 Copied from CRM #[id number]. Topic: General - Other >> Sep 14, 2024  4:53 PM Taleah C wrote: Reason for CRM: pt called to confirm if the office received a fax from [facility] today. Please call and confirm.

## 2024-09-15 DIAGNOSIS — F4322 Adjustment disorder with anxiety: Secondary | ICD-10-CM | POA: Diagnosis not present

## 2024-09-15 NOTE — Telephone Encounter (Signed)
 Called and spoke with pt, advised pt her FMLA is filled out. Pt is requesting I fax the forms to employer and pt would like to pick there forms up at her upcoming appointment

## 2024-09-17 ENCOUNTER — Telehealth: Payer: Self-pay | Admitting: Family

## 2024-09-17 NOTE — Telephone Encounter (Signed)
 Regarding her disability paperwork- we are not allowed to print records and send with these type of forms based on my understanding. You can check with Alan to be sure but I think some type of request is going to have to be sent to medical records. I have completed what I can in the office. If there are further questions or paperwork that needs to be completed, please also review with Eleanor since she saw her to take her out of work.

## 2024-09-20 ENCOUNTER — Other Ambulatory Visit: Payer: Self-pay | Admitting: Family

## 2024-09-20 ENCOUNTER — Encounter: Payer: Self-pay | Admitting: Family

## 2024-09-20 ENCOUNTER — Other Ambulatory Visit (INDEPENDENT_AMBULATORY_CARE_PROVIDER_SITE_OTHER)

## 2024-09-20 DIAGNOSIS — K921 Melena: Secondary | ICD-10-CM

## 2024-09-20 LAB — HEMOCCULT SLIDES (X 3 CARDS)
Fecal Occult Blood: NEGATIVE
OCCULT 1: NEGATIVE
OCCULT 2: NEGATIVE
OCCULT 3: NEGATIVE
OCCULT 4: NEGATIVE
OCCULT 5: NEGATIVE

## 2024-09-20 NOTE — Telephone Encounter (Signed)
 Myhcart message has been sent to pt.

## 2024-09-21 ENCOUNTER — Ambulatory Visit: Admitting: Neurology

## 2024-09-21 ENCOUNTER — Ambulatory Visit: Admitting: Family

## 2024-09-21 VITALS — BP 105/59 | HR 98 | Temp 98.6°F | Resp 16 | Ht 63.0 in | Wt 116.0 lb

## 2024-09-21 DIAGNOSIS — A0472 Enterocolitis due to Clostridium difficile, not specified as recurrent: Secondary | ICD-10-CM

## 2024-09-21 DIAGNOSIS — Z23 Encounter for immunization: Secondary | ICD-10-CM | POA: Diagnosis not present

## 2024-09-21 DIAGNOSIS — R5383 Other fatigue: Secondary | ICD-10-CM | POA: Insufficient documentation

## 2024-09-21 DIAGNOSIS — R1011 Right upper quadrant pain: Secondary | ICD-10-CM | POA: Insufficient documentation

## 2024-09-21 DIAGNOSIS — R110 Nausea: Secondary | ICD-10-CM

## 2024-09-21 DIAGNOSIS — F419 Anxiety disorder, unspecified: Secondary | ICD-10-CM

## 2024-09-21 LAB — IBC + FERRITIN
Ferritin: 10.1 ng/mL (ref 10.0–291.0)
Iron: 44 ug/dL (ref 42–145)
Saturation Ratios: 12 % — ABNORMAL LOW (ref 20.0–50.0)
TIBC: 368.2 ug/dL (ref 250.0–450.0)
Transferrin: 263 mg/dL (ref 212.0–360.0)

## 2024-09-21 LAB — VITAMIN D 25 HYDROXY (VIT D DEFICIENCY, FRACTURES): VITD: 15.29 ng/mL — ABNORMAL LOW (ref 30.00–100.00)

## 2024-09-21 LAB — SEDIMENTATION RATE: Sed Rate: 4 mm/h (ref 0–20)

## 2024-09-21 LAB — TSH: TSH: 1.7 u[IU]/mL (ref 0.35–5.50)

## 2024-09-21 LAB — T4, FREE: Free T4: 0.85 ng/dL (ref 0.60–1.60)

## 2024-09-21 LAB — VITAMIN B12: Vitamin B-12: 261 pg/mL (ref 211–911)

## 2024-09-21 LAB — T3, FREE: T3, Free: 3.3 pg/mL (ref 2.3–4.2)

## 2024-09-21 LAB — URIC ACID: Uric Acid, Serum: 4.1 mg/dL (ref 2.4–7.0)

## 2024-09-21 LAB — CORTISOL: Cortisol, Plasma: 15.7 ug/dL

## 2024-09-21 MED ORDER — ONDANSETRON HCL 4 MG PO TABS: 4.0000 mg | ORAL_TABLET | Freq: Three times a day (TID) | ORAL | 0 refills | Status: DC | PRN

## 2024-09-21 MED ORDER — FAMOTIDINE 20 MG PO TABS: 20.0000 mg | ORAL_TABLET | Freq: Two times a day (BID) | ORAL | 2 refills | Status: AC

## 2024-09-21 NOTE — Assessment & Plan Note (Signed)
 Neg US , but hx sounds like GB. Obtain HIDA scan.

## 2024-09-21 NOTE — Progress Notes (Signed)
 Subjective:     Patient ID: Donna [name], female    DOB: 26-May-1991, 33 y.o.   MRN: [id number]  Chief Complaint  Patient presents with   Anxiety    Here for follow up    HPI  Discussed the use of AI scribe software for clinical note transcription with the patient, who gave verbal consent to proceed.  History of Present Illness Donna [name] is a 33 year old female who presents with persistent nausea, dizziness, and abdominal pain.  Nausea is most severe in the mornings, hindering her ability to get out of bed and delaying eating until the afternoon, contributing to weight loss from 135 pounds to 115 pounds. Dizziness is described as a sensation of her head 'not quite on right' and worsens with head movements. Abdominal pain varies in location, sometimes starting in the upper abdomen and radiating to the chest.  She takes sertraline  50 mg for anxiety, but symptoms like dizziness and heart racing persist. She has not been engaging in activities that typically trigger anxiety. Episodes of dizziness and nausea are present, with uncertainty about their relation to anxiety.  She completed a 14-day course of vancomycin  for a C. difficile infection, with improvement in diarrhea. Stools are mostly formed, with occasional loose stools, but no severe watery diarrhea since hospitalization.  Meclizine  is prescribed for nausea, and Zofran  provided some initial relief. She is concerned about significant weight loss and difficulty eating, noting previous activity and 'bulking' at the gym before symptoms began.      Health Maintenance Due  Topic Date Due   HIV Screening  Never done   DTaP/Tdap/Td (1 - Tdap) Never done   Hepatitis B Vaccines 19-59 Average Risk (1 of 3 - 19+ 3-dose series) Never done   HPV VACCINES (1 - 3-dose SCDM series) Never done   COVID-19 Vaccine (4 - 2025-26 season) 08/30/2024    Past Medical History:  Diagnosis Date   Acid reflux    Anemia    Anxiety    C. 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[facility])     Past Surgical History:  Procedure Laterality Date   BREAST BIOPSY Left 04/25/2023   US  LT BREAST BX W LOC DEV 1ST LESION IMG BX SPEC US  GUIDE 04/25/2023 [facility]   DILATATION & CURETTAGE/HYSTEROSCOPY WITH MYOSURE N/A 03/19/2022   Procedure: DILATATION & CURETTAGE/HYSTEROSCOPY WITH MYOSURE;  Surgeon: Cleatus Moccasin, MD;  Location: [facility] ;  Service: Gynecology;  Laterality: N/A;   EYE SURGERY Bilateral    UPPER GASTROINTESTINAL ENDOSCOPY  2019    Family History  Problem Relation Age of Onset   Ovarian cysts Mother    Anemia Mother    GER disease Father    Diabetes Brother        type 1   Colon cancer Maternal Grandmother    Prostate cancer Maternal Grandfather    Multiple sclerosis Paternal Grandmother    Cancer Paternal Grandfather    Angina Paternal Grandfather    Hypertension Neg Hx     Social History   Socioeconomic History   Marital status: Married    Spouse name: Not on file   Number of children: 0   Years of education: Not on file   Highest education level: Bachelor's degree (e.g., BA, AB, BS)  Occupational History    Comment: [employer]  Tobacco Use   Smoking status: Never   Smokeless tobacco: Never  Vaping Use   Vaping status: Never  Used  Substance and Sexual Activity   Alcohol use: Yes    Comment: occasionally   Drug use: Never   Sexual activity: Yes    Partners: Male    Birth control/protection: Condom  Other Topics Concern   Not on file  Social History Narrative   Not on file   Social Drivers of Health   Financial Resource Strain: Low Risk  (08/31/2024)   Overall Financial Resource Strain (CARDIA)    Difficulty of Paying Living Expenses: Not hard at all  Food Insecurity: No Food Insecurity (08/31/2024)   Hunger Vital Sign    Worried About Running Out of Food in the Last Year: Never true    Ran Out of Food in the Last Year: Never true  Transportation Needs: No  Transportation Needs (08/31/2024)   PRAPARE - Administrator, Civil Service (Medical): No    Lack of Transportation (Non-Medical): No  Physical Activity: Inactive (08/31/2024)   Exercise Vital Sign    Days of Exercise per Week: 1 day    Minutes of Exercise per Session: 0 min  Stress: Stress Concern Present (08/31/2024)   [employer] of Occupational Health - Occupational Stress Questionnaire    Feeling of Stress: Very much  Social Connections: Moderately Isolated (08/31/2024)   Social Connection and Isolation Panel    Frequency of Communication with Friends and Family: More than three times a week    Frequency of Social Gatherings with Friends and Family: Never    Attends Religious Services: Never    Database administrator or Organizations: No    Attends Engineer, structural: Not on file    Marital Status: Married  Intimate Partner Violence: Unknown (04/05/2022)   Received from [facility]   HITS    Physically Hurt: Not on file    Insult or Talk Down To: Not on file    Threaten Physical Harm: Not on file    Scream or Curse: Not on file    Outpatient Medications Prior to Visit  Medication Sig Dispense Refill   dicyclomine  (BENTYL ) 20 MG tablet Take 1 tablet (20 mg total) by mouth 3 (three) times daily as needed for spasms. 20 tablet 0   ferrous sulfate 325 (65 FE) MG tablet Take 325 mg by mouth daily with breakfast.     ibuprofen  (ADVIL ) 800 MG tablet Take 1 tablet (800 mg total) by mouth 3 (three) times daily with meals as needed for headache, moderate pain or cramping. 60 tablet 1   loperamide  (IMODIUM ) 2 MG capsule Take 1 capsule (2 mg total) by mouth 4 (four) times daily as needed for diarrhea or loose stools. 12 capsule 0   meclizine  (ANTIVERT ) 12.5 MG tablet Take 1 tablet (12.5 mg total) by mouth every 8 (eight) hours as needed for dizziness or nausea. 30 tablet 0   potassium chloride  SA (KLOR-CON  M) 20 MEQ tablet Take 1 tablet (20 mEq total) by mouth daily.  30 tablet 0   Prenatal Vit-Fe Fumarate-FA (MULTIVITAMIN-PRENATAL) 27-0.8 MG TABS tablet Take 1 tablet by mouth daily at 12 noon.     sertraline  (ZOLOFT ) 50 MG tablet 1/2 tab once daily for 1 week, then increase to a full tab once daily on week two 30 tablet 0   No facility-administered medications prior to visit.    Allergies  Allergen Reactions   Penicillin G Rash   Sulfamethoxazole Rash    ROS    See HPI Objective:    Physical Exam Constitutional:  General: She is not in acute distress.    Appearance: Normal appearance. She is well-developed.  HENT:     Head: Normocephalic and atraumatic.     Right Ear: External ear normal.     Left Ear: External ear normal.  Eyes:     General: No scleral icterus. Neck:     Thyroid : No thyromegaly.  Cardiovascular:     Rate and Rhythm: Normal rate and regular rhythm.     Heart sounds: Normal heart sounds. No murmur heard. Pulmonary:     Effort: Pulmonary effort is normal. No respiratory distress.     Breath sounds: Normal breath sounds. No wheezing.  Abdominal:     General: Bowel sounds are normal.     Palpations: Abdomen is soft. There is no mass.     Comments: Mild RLQ/LLQ tenderness to palpation  Musculoskeletal:     Cervical back: Neck supple.  Skin:    General: Skin is warm and dry.  Neurological:     Mental Status: She is alert and oriented to person, place, and time.     Comments: Neg dix [place] pike bilaterally  Psychiatric:        Mood and Affect: Mood normal.        Behavior: Behavior normal.        Thought Content: Thought content normal.        Judgment: Judgment normal.      BP (!) 105/59 (BP Location: Right Arm, Patient Position: Sitting, Cuff Size: Small)   Pulse 98   Temp 98.6 F (37 C) (Oral)   Resp 16   Ht 5' 3 (1.6 m)   Wt 116 lb (52.6 kg)   SpO2 98%   BMI 20.55 kg/m  Wt Readings from Last 3 Encounters:  09/21/24 116 lb (52.6 kg)  09/09/24 117 lb (53.1 kg)  07/29/24 120 lb (54.4 kg)        Assessment & Plan:   Problem List Items Addressed This Visit       Unprioritized   Right upper quadrant abdominal pain   Neg US , but hx sounds like GB. Obtain HIDA scan.       Relevant Medications   ondansetron  (ZOFRAN ) 4 MG tablet   famotidine  (PEPCID ) 20 MG tablet   Other Relevant Orders   NM Hepato W/Eject Fract   H. pylori antigen, stool   Nausea   Check H Pylori, trial of pepcid  (had tongue issue with pantoprazole in the past), zofran  prn, consultation pending with GI.       Fatigue   Requesting to have labs drawn here that were ordered by sports med.  See below.   Orthostatic checks:  lying 121/68 p 93,  sitting 111/74 p 101,  standing 108/71  p 107  Does not meet strict criteria for POTS, but will monitor.         Relevant Orders   IBC + Ferritin   Cortisol   Anti-TPO Ab (RDL)   T4, free   T3, free   Vitamin D  (25 hydroxy)   B12   Uric acid   TSH   Sedimentation rate   PTH, intact (no Ca)   C. difficile colitis   Clinically resolved following treatment.       Anxiety   Seems better on sertraline - will have to see how she does when she returns to work. Hopefully she will return on 10/8 following her GI visit.       Other Visit Diagnoses  Needs flu shot    -  Primary   Relevant Orders   Flu vaccine trivalent PF, 6mos and older(Flulaval,Afluria,Fluarix,Fluzone) (Completed)       I am having Meade Koenigs start on ondansetron  and famotidine . I am also having her maintain her multivitamin-prenatal, ferrous sulfate, ibuprofen , loperamide , meclizine , potassium chloride  SA, dicyclomine , and sertraline .  Meds ordered this encounter  Medications   ondansetron  (ZOFRAN ) 4 MG tablet    Sig: Take 1 tablet (4 mg total) by mouth every 8 (eight) hours as needed for nausea or vomiting.    Dispense:  20 tablet    Refill:  0    Supervising Provider:   DOMENICA BLACKBIRD A [[id number]]   famotidine  (PEPCID ) 20 MG tablet    Sig: Take 1 tablet (20 mg total) by  mouth 2 (two) times daily.    Dispense:  60 tablet    Refill:  2    Supervising Provider:   DOMENICA BLACKBIRD A [[id number]]

## 2024-09-21 NOTE — Assessment & Plan Note (Signed)
 Seems better on sertraline - will have to see how she does when she returns to work. Hopefully she will return on 10/8 following her GI visit.

## 2024-09-21 NOTE — Assessment & Plan Note (Signed)
 Check H Pylori, trial of pepcid  (had tongue issue with pantoprazole in the past), zofran  prn, consultation pending with GI.

## 2024-09-21 NOTE — Patient Instructions (Signed)
 VISIT SUMMARY:  During your visit, we discussed your persistent nausea, dizziness, abdominal pain, and significant weight loss. We also reviewed your recent improvement in diarrhea following treatment for a C. difficile infection and your ongoing anxiety management.  YOUR PLAN:  NAUSEA, VOMITING, AND UNINTENTIONAL WEIGHT LOSS WITH ABDOMINAL PAIN AND DIZZINESS: You have been experiencing persistent nausea, dizziness, and abdominal pain, which has led to significant weight loss. We are considering several potential causes, including gallbladder issues, H. pylori infection, POTS, and vertigo. -We will order a HIDA scan to check your gallbladder function. -We will also order an H. pylori breath test. -You will be prescribed Pepcid  to help with potential ulcer-related symptoms. -Continue taking Zofran  for nausea. -We will perform orthostatic vital signs to evaluate for POTS. -You will receive a flu shot today.  DIARRHEA, IMPROVING AFTER CLOSTRIDIUM DIFFICILE INFECTION: Your diarrhea has improved after completing the vancomycin  treatment, with mostly formed stools now. -Follow up with your GI specialist for ongoing symptoms and management.  ANXIETY DISORDER, UNSPECIFIED: Your anxiety has not significantly improved with your current dose of sertraline . We discussed the possibility of increasing your dose if your anxiety worsens when you return to work. -Consider increasing your sertraline  dose to 75 mg if your anxiety worsens upon returning to work.

## 2024-09-21 NOTE — Assessment & Plan Note (Signed)
 Clinically resolved following treatment.

## 2024-09-21 NOTE — Assessment & Plan Note (Addendum)
 Requesting to have labs drawn here that were ordered by sports med.  See below.   Orthostatic checks:  lying 121/68 p 93,  sitting 111/74 p 101,  standing 108/71  p 107  Does not meet strict criteria for POTS, but will monitor.

## 2024-09-22 ENCOUNTER — Ambulatory Visit: Payer: Self-pay | Admitting: Family

## 2024-09-22 ENCOUNTER — Other Ambulatory Visit: Payer: Self-pay

## 2024-09-22 ENCOUNTER — Ambulatory Visit: Payer: Self-pay

## 2024-09-22 DIAGNOSIS — E538 Deficiency of other specified B group vitamins: Secondary | ICD-10-CM

## 2024-09-22 DIAGNOSIS — E559 Vitamin D deficiency, unspecified: Secondary | ICD-10-CM

## 2024-09-22 LAB — PARATHYROID HORMONE, INTACT (NO CA): PTH: 17 pg/mL (ref 16–77)

## 2024-09-22 MED ORDER — VITAMIN D (ERGOCALCIFEROL) 1.25 MG (50000 UNIT) PO CAPS: 50000.0000 [IU] | ORAL_CAPSULE | ORAL | 0 refills | Status: AC

## 2024-09-22 MED ORDER — VITAMIN D (ERGOCALCIFEROL) 1.25 MG (50000 UNIT) PO CAPS: 50000.0000 [IU] | ORAL_CAPSULE | ORAL | 0 refills | Status: DC

## 2024-09-22 NOTE — Telephone Encounter (Signed)
 FYI Only or Action Required?: FYI only for provider.  Patient was last seen in primary care on 09/21/2024 by Daryl Setter, NP.  Called Nurse Triage reporting Medication Reaction.  Symptoms began today.  Interventions attempted: OTC medications: Tylenol , zofran  and Rest, hydration, or home remedies.  Symptoms are: unchanged.  Triage Disposition: Home Care  Patient/caregiver understands and will follow disposition?: Yes  Copied from CRM #[id number]. Topic: Clinical - Red Word Triage >> Sep 22, 2024 10:51 AM Harlene ORN wrote: Red Word that prompted transfer to Nurse Triage: is not sure if it;s related to the flu  shot fevers and chills, fatigue, nausea, and increased heart rate.  Diarrhea,  Reason for Disposition  Influenza (Injection; Quadrivalent or Trivalent) injected vaccine reactions  Palpitations  Answer Assessment - Initial Assessment Questions Yesterday got flu vaccine with onset of high heart rate (100-120) at rest/with exertion, nausea, diarrhea, fever/chills with temp of 99.1. Also reporting mild dizziness though this is chronic and not any worse. Has had flu vaccine in the past with mild reactions of same symptoms. Has had palpitations since 2019, triggered by stress and caffeine. Not on medications for it, managed with mindfulness and breathing exercises. Recently tested positive for c-diff. Finished tx with abxs on September 8th for c-diff. Diarrhea stopped and now restarted after getting flu vaccine. Tried tylenol  and zofran  with positive effect. Gave pt home care instructions, advised that is symptoms continue on or get worse to call back or go to ED for severe symptoms, particularly persistent elevated HR of 140+.  1. SYMPTOMS: What is the main symptom? (e.g., pain, redness, or swelling at injection site; feeling tired, fever, muscle aches)      High heart rate, nausea, diarrhea, fever/chills 2. ONSET: When was the vaccine (shot) given? How much later did the  yesterday begin? (e.g., hours, days ago)      today 3. SEVERITY: How bad is it?      moderate 4. FEVER: Do you have a fever? If Yes, ask: What is your temperature, how was it measured, and when did it start?      99.1 F yesterday, 97.5 F today 5. IMMUNIZATIONS GIVEN: What shots have you recently received?     Just flu vaccine 6. PAST REACTIONS: Have you reacted to immunizations before? If Yes, ask: What happened?     Yes, mild reactions (same symptoms listed above) to flu vaccine in the past 7. OTHER SYMPTOMS: Do you have any other symptoms?     No 8. PREGNANCY: Is there any chance you are pregnant? When was your last menstrual period?     No, LMP past Friday.  Answer Assessment - Initial Assessment Questions 1. DESCRIPTION: Please describe your heart rate or heartbeat that you are having (e.g., fast/slow, regular/irregular, skipped or extra beats, palpitations)     Resting heart rate 100-120 even at rest. Normal Resting HR 70's. 2. ONSET: When did it start? (e.g., minutes, hours, days)      Today 3. DURATION: How long does it last (e.g., seconds, minutes, hours)     Continuous 4. PATTERN Does it come and go, or has it been constant since it started?  Does it get worse with exertion?   Are you feeling it now?     Continuous, exacerbated by stress 6. HEART RATE: Can you tell me your heart rate? How many beats in 15 seconds?  Note: Not all patients can do this.       100-120  7. RECURRENT SYMPTOM: Have you  ever had this before? If Yes, ask: When was the last time? and What happened that time?      Dx with palpitations in 2019, exacerbated by stress 8. CAUSE: What do you think is causing the palpitations?     Stress from flu vaccine reaction symptoms 9. CARDIAC HISTORY: Do you have any history of heart disease? (e.g., heart attack, angina, bypass surgery, angioplasty, arrhythmia)       Dx with palpitations in 2019 10. OTHER SYMPTOMS: Do you  have any other symptoms? (e.g., dizziness, chest pain, sweating, difficulty breathing)       Chronic low grade dizziness since 2019 11. PREGNANCY: Is there any chance you are pregnant? When was your last menstrual period?       No, LMP last Friday  Protocols used: Immunization Reactions-A-AH, Heart Rate and Heartbeat Questions-A-AH

## 2024-09-22 NOTE — Telephone Encounter (Signed)
 Iron is improving, Vit D is very low.   Advise patient to begin vit D 50000 units once weekly for 12 weeks, then repeat vit D level (dx Vit D deficiency).    B12 is also low normal. Please add otc vit B12 1000 mcg PO daily. Repeat b12 in 3 months.   Thyroid  function testing is normal.  Gout test is normal.   There are a few more studies that are still pending and I will send them to her via mychart when they are complete.

## 2024-09-27 NOTE — Progress Notes (Unsigned)
 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  Migraine with aura   Check MRI of brain with and without contrast for further evaluation of ongoing dizziness and visual disturbance, as well as to follow up on CT head findings. Migraine prevention:  Plan to start Aimovig 140mg  every 28 days.  Would not use topiramate as she already has had an unintentional 20 lb weight loss.  Would not start an antidepressant as she recently has just started an SSRI.  Due to lightheadedness, would rather not start a beta blocker.   Refer to vestibular rehab May use Zofran  for nausea and meclizine  sparingly Lifestyle modification: Limit use of pain relievers to no more than 9 days out of the month to prevent risk of rebound or medication-overuse headache. Diet modification/hydration/caffeine cessation Routine exercise Sleep hygiene Consider vitamins/supplements:  magnesium citrate 400mg  daily, riboflavin 400mg  daily, CoQ10 100mg  three times daily Keep headache diary Follow up 6 months.   Total time spent on today's visit was 73 minutes dedicated to this patient today, preparing to see patient, examining the patient, ordering tests and/or medications and counseling the patient, documenting clinical information in the EHR or other health record, independently interpreting results and communicating results to the patient/family, discussing treatment and goals, answering patient's questions and coordinating care.     Subjective:  Donna [name] is a 33 year old right-handed female with anemia, palpitations (negative cardiac workup), and GERD who presents for dizziness.  History supplemented by ED, prior neurlogist's and referring provider's notes.   She began  experiencing vertigo when she woke up one morning in 2023.  It resolved.  She had a recurrence when she woke up again with vertigo in March 2025, described as spinning sensation.  She was evaluated in Urgent Care and found to have fluid behind her ears, so she was treated with steroids and antibiotics and symptoms resolved.  The vertigo returned in April.  Since May-June 2025, she has had constant dizziness, described as a floating feeling.  However, she will have episodes of spinning, usually lasting a few seconds but has had episodes lasting as long as 10 minutes.  She was recently at the dentist and when the chair was tilted backward, she became dizzy.  Lying down flat helps.  It does not affect her driving.  She had an episode where she was working at her computer when she developed sudden vertigo.  She was unable to see the top half of her vision.  She also began experiencing gastrointestinal symptoms in March 2025, presenting as nausea, non-bloody diarrhea, abdominal pain and loss of appetite with weight loss.  She has had a 20 lb weight loss.  She has been evaluated by GI who has scheduled an EGD and colonoscopy.  She was subsequently diagnosed and treated for C. Diff.  She previously had gastrointestinal complaints in 2019.  Work up, including for Celiac disease with biopsy, was unremarkable.    She has history of migraines presenting with fuzzy vision, which have been controlled.  She was seen by neurology in 2019 for migraines.  CT head on 05/08/2018 showed possible remote left occipital infarct but the neurologist believed that it was either an enlarged fissure or mild schizencephaly.  Since May 2025, she has been experiencing a mild head pressure on her  scalp and above her right eye.  No associated nausea, photophobia, phonophobia.  She was seen in the ED on 07/29/2024 for ongoing dizziness and gastrointestinal symptoms.  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    Past NSAIDS/analgesics:  ibuprofen , Toradol  IV Past abortive triptans:  none Past abortive ergotamine:  none Past muscle relaxants:  tizanidine  Past anti-emetic:  none Past antihypertensive medications:  none Past antidepressant medications:  none Past anticonvulsant medications:  none Past anti-CGRP:  none Past vitamins/Herbal/Supplements:  none Past antihistamines/decongestants:  Xyzal  Other past therapies:  none  Imaging: 05/08/2018 CT HEAD WO:  1. No acute intracranial abnormality identified. 2. Small remote left occipital lobe infarct.  3. Small left mastoid effusion.  07/29/2024 CT HEAD WO:  1.  No evidence of an acute intracranial abnormality. 2. 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 3. Trace left mastoid effusion.   Current NSAIDS/analgesics:  ibuprofen  800mg  Current triptans:  none Current ergotamine:  none Current anti-emetic:  Zofran  4mg  Current muscle relaxants:  none Current Antihypertensive medications:  none Current Antidepressant medications:  sertraline  50mg  daily Current Anticonvulsant medications:  none Current anti-CGRP:  none Current Vitamins/Herbal/Supplements:  prenatal, ferrous sulfate, D Current Antihistamines/Decongestants:  meclizine  12.5mg  PRN Other therapy:  none Birth control:  none Other medications:  Imodium , Pepcid , Bentyl    PAST MEDICAL HISTORY: Past Medical History:  Diagnosis Date   Acid reflux    Anemia    Anxiety    C. 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(HCC)     PAST SURGICAL HISTORY: Past Surgical History:  Procedure Laterality Date   BREAST BIOPSY Left 04/25/2023   US  LT BREAST BX W LOC DEV 1ST LESION IMG BX SPEC US  GUIDE 04/25/2023 [facility]MOGRAP[facility]   DILATATION & CURETTAGE/HYSTEROSCOPY WITH MYOSURE N/A 03/19/2022   Procedure: DILATATION & CURETTAGE/HYSTEROSCOPY WITH MYOSURE;  Surgeon:  Cleatus Moccasin, MD;  Location: [facility] [place];  Service: Gynecology;  Laterality: N/A;   EYE SURGERY Bilateral    UPPER GASTROINTESTINAL ENDOSCOPY  2019    MEDICATIONS: Current Outpatient Medications on File Prior to Visit  Medication Sig Dispense Refill   Vitamin D , Ergocalciferol , (DRISDOL ) 1.25 MG (50000 UNIT) CAPS capsule Take 1 capsule (50,000 Units total) by mouth every 7 (seven) days. 12 capsule 0   dicyclomine  (BENTYL ) 20 MG tablet Take 1 tablet (20 mg total) by mouth 3 (three) times daily as needed for spasms. 20 tablet 0   famotidine  (PEPCID ) 20 MG tablet Take 1 tablet (20 mg total) by mouth 2 (two) times daily. 60 tablet 2   ferrous sulfate 325 (65 FE) MG tablet Take 325 mg by mouth daily with breakfast.     ibuprofen  (ADVIL ) 800 MG tablet Take 1 tablet (800 mg total) by mouth 3 (three) times daily with meals as needed for headache, moderate pain or cramping. 60 tablet 1   loperamide  (IMODIUM ) 2 MG capsule Take 1 capsule (2 mg total) by mouth 4 (four) times daily as needed for diarrhea or loose stools. 12 capsule 0   meclizine  (ANTIVERT ) 12.5 MG tablet Take 1 tablet (12.5 mg total) by mouth every 8 (eight) hours as needed for dizziness or nausea. 30 tablet 0   ondansetron  (ZOFRAN ) 4 MG tablet Take 1 tablet (4 mg total) by mouth every 8 (eight) hours as needed for nausea or vomiting. 20 tablet 0   potassium chloride  SA (KLOR-CON   M) 20 MEQ tablet Take 1 tablet (20 mEq total) by mouth daily. 30 tablet 0   Prenatal Vit-Fe Fumarate-FA (MULTIVITAMIN-PRENATAL) 27-0.8 MG TABS tablet Take 1 tablet by mouth daily at 12 noon.     sertraline  (ZOLOFT ) 50 MG tablet 1/2 tab once daily for 1 week, then increase to a full tab once daily on week two 30 tablet 0   No current facility-administered medications on file prior to visit.    ALLERGIES: Allergies  Allergen Reactions   Penicillin G Rash   Sulfamethoxazole Rash    FAMILY HISTORY: Family History  Problem Relation Age of Onset    Ovarian cysts Mother    Anemia Mother    GER disease Father    Diabetes Brother        type 1   Colon cancer Maternal Grandmother    Prostate cancer Maternal Grandfather    Multiple sclerosis Paternal Grandmother    Cancer Paternal Grandfather    Angina Paternal Grandfather    Hypertension Neg Hx     Objective:  Blood pressure 128/81, pulse (!) 105, resp. rate (!) 100, height 5' 3 (1.6 m), weight 117 lb (53.1 kg). General: No acute distress.  Patient appears well-groomed.   Head:  Normocephalic/atraumatic Eyes:  fundi examined but not visualized Neck: supple, no paraspinal tenderness, full range of motion Heart: regular rate and rhythm Neurological Exam: Mental status: alert and oriented to person, place, and time, speech fluent and not dysarthric, language intact. Cranial nerves: CN I: not tested CN II: pupils equal, round and reactive to light, visual fields intact CN III, IV, VI:  full range of motion, no nystagmus, no ptosis CN V: facial sensation intact. CN VII: upper and lower face symmetric CN VIII: hearing intact CN IX, X: gag intact, uvula midline CN XI: sternocleidomastoid and trapezius muscles intact CN XII: tongue midline Bulk & Tone: normal, no fasciculations. Motor:  muscle strength 5/5 throughout Sensation:  Pinprick and vibratory sensation intact. Deep Tendon Reflexes:  2+ throughout,  toes downgoing.   Finger to nose testing:  Without dysmetria.   Gait:  Normal station and stride.  Romberg negative.    Thank you for allowing me to take part in the care of this patient.  Juliene Dunnings, DO  CC:  Eleanor Ponto, NP  Leita Eliza Elbe, FNP

## 2024-09-28 ENCOUNTER — Other Ambulatory Visit

## 2024-09-28 ENCOUNTER — Encounter: Payer: Self-pay | Admitting: Neurology

## 2024-09-28 ENCOUNTER — Ambulatory Visit: Admitting: Neurology

## 2024-09-28 VITALS — BP 128/81 | HR 105 | Resp 100 | Ht 63.0 in | Wt 117.0 lb

## 2024-09-28 DIAGNOSIS — G43111 Migraine with aura, intractable, with status migrainosus: Secondary | ICD-10-CM | POA: Diagnosis not present

## 2024-09-28 DIAGNOSIS — R42 Dizziness and giddiness: Secondary | ICD-10-CM | POA: Diagnosis not present

## 2024-09-28 DIAGNOSIS — R1011 Right upper quadrant pain: Secondary | ICD-10-CM | POA: Diagnosis not present

## 2024-09-28 DIAGNOSIS — R9089 Other abnormal findings on diagnostic imaging of central nervous system: Secondary | ICD-10-CM | POA: Diagnosis not present

## 2024-09-28 MED ORDER — AIMOVIG 140 MG/ML ~~LOC~~ SOAJ: 140.0000 mg | SUBCUTANEOUS | 5 refills | Status: AC

## 2024-09-28 NOTE — Patient Instructions (Addendum)
  Check MRI of brain with and without contrast. We have sent a referral to [facility] for your MRI and they will call you directly to schedule your appointment. They are located at [address] [facility]. If you need to contact them directly please call [phone].  Plan to start Aimovig 140mg  inj every 28 days.  If no improvement in 3 months, contact me Refer to vestibular rehab May use Zofran  for nausea or meclizine  for acute vertigo attacks, but use sparingly Limit use of pain relievers to no more than 9 days out of the month.  These medications include acetaminophen , NSAIDs (ibuprofen /Advil /Motrin , naproxen/Aleve, triptans (Imitrex/sumatriptan), Excedrin, and narcotics.  This will help reduce risk of rebound headaches. Be aware of common food triggers: Stay adequately hydrated (aim for 64 oz water daily) Keep headache diary Maintain proper stress management Maintain proper sleep hygiene Do not skip meals Consider supplements:  magnesium citrate 400mg  daily, riboflavin 400mg  daily, coenzyme Q10 100mg  three times daily.

## 2024-09-29 DIAGNOSIS — F4322 Adjustment disorder with anxiety: Secondary | ICD-10-CM | POA: Diagnosis not present

## 2024-09-30 ENCOUNTER — Encounter (HOSPITAL_COMMUNITY)
Admission: RE | Admit: 2024-09-30 | Discharge: 2024-09-30 | Disposition: A | Discharge status: Home / Self Care | Source: Ambulatory Visit | Attending: Family | Admitting: Family

## 2024-09-30 ENCOUNTER — Telehealth: Payer: Self-pay | Admitting: Pharmacy Technician

## 2024-09-30 DIAGNOSIS — R1011 Right upper quadrant pain: Secondary | ICD-10-CM | POA: Diagnosis not present

## 2024-09-30 DIAGNOSIS — R110 Nausea: Secondary | ICD-10-CM | POA: Diagnosis not present

## 2024-09-30 DIAGNOSIS — R109 Unspecified abdominal pain: Secondary | ICD-10-CM | POA: Diagnosis not present

## 2024-09-30 LAB — H. PYLORI ANTIGEN, STOOL: H pylori Ag, Stl: NEGATIVE

## 2024-09-30 MED ORDER — TECHNETIUM TC 99M MEBROFENIN IV KIT
5.0000 | PACK | Freq: Once | INTRAVENOUS | Status: AC | PRN
  Administered 2024-09-30: 5.28 via INTRAVENOUS

## 2024-09-30 NOTE — Telephone Encounter (Addendum)
 Pharmacy Patient Advocate Encounter   Received notification from CoverMyMeds that prior authorization for AIMOVIG 140MG  is required/requested.   Insurance verification completed.   The patient is insured through [employer] **[place] .   Per test claim: PA required; PA started via CoverMyMeds. KEY [id number] . Please see clinical question(s) below that I am not finding the answer to in their chart and advise.

## 2024-10-01 ENCOUNTER — Encounter: Payer: Self-pay | Admitting: Family

## 2024-10-01 ENCOUNTER — Other Ambulatory Visit (HOSPITAL_COMMUNITY): Payer: Self-pay

## 2024-10-01 DIAGNOSIS — F419 Anxiety disorder, unspecified: Secondary | ICD-10-CM

## 2024-10-01 NOTE — Telephone Encounter (Signed)
 Pharmacy Patient Advocate Encounter  Received notification from [facility] ** that Prior Authorization for AIMOVIG 140MG  has been APPROVED from 10.3.25 to 12.31.2039. Ran test claim, Copay is $30. This test claim was processed through [facility]- copay amounts may vary at other pharmacies due to pharmacy/plan contracts, or as the patient moves through the different stages of their insurance plan.   PA #/Case ID/Reference #: [id number]

## 2024-10-01 NOTE — Telephone Encounter (Signed)
PA has been submitted with updated information

## 2024-10-01 NOTE — Telephone Encounter (Signed)
 PA has been submitted, and telephone encounter has been created. Please see telephone encounter dated 10.3.25.

## 2024-10-04 ENCOUNTER — Encounter: Payer: Self-pay | Admitting: Physical Therapy

## 2024-10-04 ENCOUNTER — Ambulatory Visit: Attending: Neurology | Admitting: Physical Therapy

## 2024-10-04 DIAGNOSIS — R29818 Other symptoms and signs involving the nervous system: Secondary | ICD-10-CM | POA: Insufficient documentation

## 2024-10-04 DIAGNOSIS — R42 Dizziness and giddiness: Secondary | ICD-10-CM | POA: Diagnosis not present

## 2024-10-04 DIAGNOSIS — R2681 Unsteadiness on feet: Secondary | ICD-10-CM | POA: Insufficient documentation

## 2024-10-04 LAB — ANTI-TPO AB (RDL): Anti-TPO Ab (RDL): <9 [IU]/mL (ref <9.0)

## 2024-10-04 MED ORDER — SERTRALINE HCL 50 MG PO TABS: 75.0000 mg | ORAL_TABLET | Freq: Every day | ORAL | 0 refills | Status: DC

## 2024-10-04 NOTE — Therapy (Signed)
 OUTPATIENT PHYSICAL THERAPY VESTIBULAR EVALUATION     Patient Name: Donna [name] MRN: [id number] DOB:05-23-1991, 33 y.o., female Today's Date: 10/04/2024  END OF SESSION:  PT End of Session - 10/04/24 0944     Visit Number 1    Number of Visits 5   4 visits + eval   Date for Recertification  11/05/24    Authorization Type BCBS    PT Start Time 0845    PT Stop Time 0938    PT Time Calculation (min) 53 min    Activity Tolerance Patient tolerated treatment well    Behavior During Therapy [facility] for tasks assessed/performed          Past Medical History:  Diagnosis Date   Acid reflux    Anemia    Anxiety    C. 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[facility])    Past Surgical History:  Procedure Laterality Date   BREAST BIOPSY Left 04/25/2023   US  LT BREAST BX W LOC DEV 1ST LESION IMG BX SPEC US  GUIDE 04/25/2023 [facility]AMMOGRAP[facility]   DILATATION & CURETTAGE/HYSTEROSCOPY WITH MYOSURE N/A 03/19/2022   Procedure: DILATATION & CURETTAGE/HYSTEROSCOPY WITH MYOSURE;  Surgeon: Cleatus Moccasin, MD;  Location: [facility] [place];  Service: Gynecology;  Laterality: N/A;   EYE SURGERY Bilateral    UPPER GASTROINTESTINAL ENDOSCOPY  2019   Patient Active Problem List   Diagnosis Date Noted   Fatigue 09/21/2024   Right upper quadrant abdominal pain 09/21/2024   Nausea 09/21/2024   Tachycardia 08/31/2024   C. 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   Migraine without aura and without status migrainosus, not intractable 12/15/2018   Anxiety 05/22/2018   Palpitations 05/18/2018   Globus sensation 01/15/2017    PCP: Daryl Setter, NP REFERRING PROVIDER: Skeet Juliene SAUNDERS,  DO  REFERRING DIAG: R42 (ICD-10-CM) - Vertigo  THERAPY DIAG:  Dizziness and giddiness  Other symptoms and signs involving the nervous system  Unsteadiness on feet  ONSET DATE: July 2025  Rationale for Evaluation and Treatment: Rehabilitation  SUBJECTIVE:   SUBJECTIVE STATEMENT: Pt ambulating independently to PT eval - no device used ; pt reports Aimovig was approved late Friday (10-01-24) so she has not gotten it yet from the pharmacy but plans to do so. Pt reports she had episode of vertigo a couple of years ago (2023) when she woke up during middle of night and had spinning vertigo; another episode occurred in March 2025.  Says she got the flu vaccine recently and it provoked symptoms. Pt reports she felt anxious this morning, therefore, is having more dizziness at this time.  Pt reports has fear of passing out in bathroom, due to having an episode at work when she needed help and had to call a co-worker.  Pt says when she has 2 or more symptoms occur together (nausea/diarrhea/ dizziness) that is when her HR spikes. Pt drove some last week. Pt has been out of work since 08-23-24. Went to ED end of July with vertigo, GI issues; had C-dff in August; feels like head is spinning most of the time. Says she is approved for FMLA thru Oct. Has MRI scheduled on 10-24-24. Pt accompanied by: self - husband drove her to PT  eval appt today  PERTINENT HISTORY: anemia, palpitations (negative cardiac workup), GERD, schizencephaly, tachycardia, h/o C. Diff colitis in August 2025  PAIN:  Are you having pain? Yes: NPRS scale: 2/10 Pain location: head - feels light pressure; abdominal pain - mild at this time Pain description: light pressure in head Aggravating factors: motion increases headache Relieving factors: medication, naps/lying down  PRECAUTIONS: None  RED FLAGS: None   WEIGHT BEARING RESTRICTIONS: No  FALLS: Has patient fallen in last 6 months? No  LIVING ENVIRONMENT: Lives with: lives  with their spouse Lives in: House/apartment  PLOF: Independent - pt is currently on FMLA  PATIENT GOALS: best way to manage the dizziness - so I can start functioning again  OBJECTIVE:  Note: Objective measures were completed at Evaluation unless otherwise noted.  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 MRI scheduled on 10-24-24  COGNITION: Overall cognitive status: WFL's but pt reports memory issues, loses train of thought easily - must write things down  Cervical ROM:  WNL's   GAIT: Gait pattern: WFL Distance walked: 2' Assistive device utilized: None Level of assistance: Complete Independence  VESTIBULAR ASSESSMENT:  GENERAL OBSERVATION: pt is a 33 yr old female with c/o vertigo/dizziness; pt reports initial episode of vertigo occurred in 2023; another episode occurred in March 2025 and most recent in July 2025 but this episode was accompanied by 3 day onset of symptoms of diarrhea, dizziness and abdominal pain.     SYMPTOM BEHAVIOR:  Subjective history: most of time feels worse in the morning; wears contacts so is unable to see first thing in the morning  Non-Vestibular symptoms: neck pain, headaches, tinnitus, nausea/vomiting, and migraine symptoms  Type of dizziness: Spinning/Vertigo, Unsteady with head/body turns, Lightheadedness/Faint, and Funny feeling in the head  Frequency: daily - mostly  Duration: varies  Aggravating factors: Induced by motion: occur when walking, looking up at the ceiling, bending down to the ground, and quick head turns  Relieving factors: lying supine, medication, rest, slow movements, avoid busy/distracting environments, and new migraine medication just approved (Aimovig)  Progression of symptoms: worse  OCULOMOTOR EXAM:  Ocular Alignment: normal  Ocular ROM: No Limitations  Spontaneous Nystagmus: absent  Gaze-Induced Nystagmus:  absent  Smooth Pursuits: intact  Saccades: intact  Convergence/Divergence: approx. 6    FRENZEL - FIXATION SUPRESSED:  Ocular Alignment: normal  Spontaneous Nystagmus: absent  Gaze-Induced Nystagmus: absent   Positional tests: Right Dix-Hallpike: no nystagmus Left Dix-Hallpike: no nystagmus Right Sidelying: no nystagmus Left Sidelying: no nystagmus c/o pressure with return to sitting   VESTIBULAR - OCULAR REFLEX:    Dynamic Visual Acuity: Static: line 10  Dynamic: line 7    POSITIONAL TESTING:  Right Dix-Hallpike: no nystagmus Left Dix-Hallpike: no nystagmus Right Sidelying: no nystagmus Left Sidelying: no nystagmus  MOTION SENSITIVITY:  Motion Sensitivity Quotient Intensity: 0 = none, 1 = Lightheaded, 2 = Mild, 3 = Moderate, 4 = Severe, 5 = Vomiting  Intensity  1. Sitting to supine   2. Supine to L side   3. Supine to R side   4. Supine to sitting   5. L Hallpike-Dix   6. Up from L    7. R Hallpike-Dix   8. Up from R    9. Sitting, head tipped to L knee   10. Head up from L knee   11. Sitting, head tipped to R knee   12. Head up from R knee  13. Sitting head turns x5 1  14.Sitting head nods x5 2 - > symptoms  15. In stance, 180 turn to L    16. In stance, 180 turn to R      FUNCTIONAL GAIT: MCTSIB: Condition 1: Avg of 3 trials: 30 sec, Condition 2: Avg of 3 trials: 30 sec, Condition 3: Avg of 3 trials: 30 sec, Condition 4: Avg of 3 trials: 30 sec, and Total Score: 120/120  Pt reported dizziness upon completion of condition 4 but did not have LOB                                                                                                                           TREATMENT DATE: 10-04-24  Gaze stabilization - in standing; target 4 away on plain background; 30 secs horizontal and 30 secs vertical - 1 rep each; pt reported hearing a clicking noise behind her Lt ear - etiology unknown as did not appear to be in location of cervical vertebrae; instructed pt  to monitor and if it worsened she should D/C x1 viewing exercise - instructed her to discuss this symptom with her MD - stated she sees her sports medicine MD soon and will discuss with him  Access Code: [facility] URL: [email or web address] Date: 10/04/2024 Prepared by: Rock Kussmaul  Exercises - Standing Gaze Stabilization with Head Rotation  - 3-5 x daily - 7 x weekly - 1 sets - 1 reps - 30-60 hold - Standing Gaze Stabilization with Head Nod  - 3-5 x daily - 7 x weekly - 1 sets - 1 reps - 30-60 hold - Standing on foam pad - Eyes open and then with Eyes closed   - 1 x daily - 7 x weekly - 1 sets - 1 reps - 30 sec hold  PATIENT EDUCATION: Education details: Medbridge  Person educated: Patient Education method: Programmer, multimedia, Facilities manager, and Handouts Education comprehension: verbalized understanding and returned demonstration  HOME EXERCISE PROGRAM:  Medbridge   GOALS: Goals reviewed with patient? Yes  SHORT TERM GOALS: same as LTG's as ELOS = 4 wks   LONG TERM GOALS: Target date: 11-05-24  Improve DVA to </= 2 line difference for improved gaze stabilization. Baseline: line 10/line 7 (3 line difference) Goal status: INITIAL  2.  Pt will perform vertical head turns (5 reps) in seated position with c/o dizziness </= 1/5 intensity. Baseline: 2/5 Goal status: INITIAL  3.  Pt will stand for 30 secs on condition 4 of mCTSIB with no c/o dizziness to demo increased vestibular input in maintaining balance.  Baseline: increased symptoms provoked after standing for 30 secs with min. Postural sway Goal status: INITIAL  4.  Independent in HEP for vestibular exercises. Baseline:  Goal status: INITIAL  5.  Pt will subjectively report at least 25% improvement in dizziness.  Baseline:  Goal status: INITIAL  ASSESSMENT:  CLINICAL IMPRESSION: Patient is a 33 y.o. lady who was seen today for physical therapy evaluation and treatment  for dizziness secondary to vestibular  migraine.  Pt's PMH includes anemia, palpitations (negative cardiac workup), GERD, schizencephaly, tachycardia, h/o C. Diff colitis in August 2025, and anxiety.  Pt reports she experiences dizziness daily with pressure sensation in her head.  Pt has mildly impaired VOR with DVA 3 line difference from SVA.  Pt reported > dizziness provoked with vertical head turns compared with horizontal.  Pt will benefit from PT to address dizziness and visual/vestibular deficits.  OBJECTIVE IMPAIRMENTS: decreased balance, dizziness, and pain.   ACTIVITY LIMITATIONS: locomotion level  PARTICIPATION LIMITATIONS: interpersonal relationship, driving, and community activity  PERSONAL FACTORS: Behavior pattern, Past/current experiences, Time since onset of injury/illness/exacerbation, and 1-2 comorbidities: h/o migraines  & schizencephaly are also affecting patient's functional outcome.   REHAB POTENTIAL: Good  CLINICAL DECISION MAKING: Evolving/moderate complexity  EVALUATION COMPLEXITY: Moderate   PLAN:  PT FREQUENCY: 1x/week  PT DURATION: 4 weeks + eval  PLANNED INTERVENTIONS: 97110-Therapeutic exercises, 97530- Therapeutic activity, [id number]- Neuromuscular re-education, 97535- Self Care, and [id number]- Gait training  PLAN FOR NEXT SESSION: review HEP - give information on vestibular migraines/foods; amb. With head turns   Roxanna [place], PT 10/04/2024, 8:23 PM

## 2024-10-05 ENCOUNTER — Encounter: Payer: Self-pay | Admitting: Internal Medicine

## 2024-10-05 ENCOUNTER — Ambulatory Visit: Admitting: Internal Medicine

## 2024-10-05 VITALS — BP 106/60 | HR 89 | Ht 63.0 in | Wt 117.0 lb

## 2024-10-05 DIAGNOSIS — R634 Abnormal weight loss: Secondary | ICD-10-CM | POA: Diagnosis not present

## 2024-10-05 DIAGNOSIS — R1085 Abdominal pain of multiple sites: Secondary | ICD-10-CM

## 2024-10-05 DIAGNOSIS — R109 Unspecified abdominal pain: Secondary | ICD-10-CM

## 2024-10-05 DIAGNOSIS — R110 Nausea: Secondary | ICD-10-CM

## 2024-10-05 DIAGNOSIS — Z8619 Personal history of other infectious and parasitic diseases: Secondary | ICD-10-CM

## 2024-10-05 DIAGNOSIS — R197 Diarrhea, unspecified: Secondary | ICD-10-CM

## 2024-10-05 DIAGNOSIS — D509 Iron deficiency anemia, unspecified: Secondary | ICD-10-CM

## 2024-10-05 MED ORDER — METOCLOPRAMIDE HCL 10 MG PO TABS: ORAL_TABLET | ORAL | 0 refills | Status: DC

## 2024-10-05 NOTE — Patient Instructions (Signed)
 You have been scheduled for an endoscopy and colonoscopy. Please follow the written instructions given to you at your visit today.  If you use inhalers (even only as needed), please bring them with you on the day of your procedure.  DO NOT TAKE 7 DAYS PRIOR TO TEST- Trulicity (dulaglutide) Ozempic, Wegovy (semaglutide) Mounjaro (tirzepatide) Bydureon Bcise (exanatide extended release)  DO NOT TAKE 1 DAY PRIOR TO YOUR TEST Rybelsus (semaglutide) Adlyxin (lixisenatide) Victoza (liraglutide) Byetta (exanatide) ___________________________________________________________________________  We have sent the following medications to your pharmacy for you to pick up at your convenience: Reglan to assist with prepping to prevent nausea  I appreciate the opportunity to care for you. Lupita Commander, MD, [facility]

## 2024-10-05 NOTE — Progress Notes (Signed)
 Donna [name] 33 y.o. 1991-01-06 [id number]  Assessment & Plan:   Encounter Diagnoses  Name Primary?   Diarrhea, unspecified type Yes   Loss of weight    Nausea without vomiting    Abdominal pain of multiple sites    History of Clostridioides difficile colitis    Iron deficiency anemia, unspecified iron deficiency anemia type      Persistent nausea, abdominal pain, diarrhea with urgency, and weight loss despite treatment for C. difficile infection. Differential includes irritable bowel syndrome, possibly exacerbated by anxiety. No gallbladder dysfunction on HIDA scan. Endoscopic evaluation planned as IBD is in the differential also. - Schedule upper endoscopy and colonoscopy. - Continue sertraline . Consider mirtazapine which would treat insomnia and nausea/appetite issues.      She does apparently have some vertigo so nausea could be related to that or possible migraine as per neurology.  Given the severe nausea issues will have her take 10 mg Reglan prior to each phase of her colonoscopy prep to reduce the chance of prep intolerance.    Further plans pending endoscopic evaluation.  Subjective:   Chief Complaint: Diarrhea nausea weight loss abdominal pain  HPI Discussed the use of AI scribe software for clinical note transcription with the patient, who gave verbal consent to proceed.   Donna [name] is a 33 year old female who presents for follow-up of chronic nausea, weight loss diarrhea, and prior C. difficile infection.  She was initially seen in our clinic on July 22, 2024 by Camie Furbish PA-C please see that note for further details as well.  I have reviewed that I have reviewed neurology notes from September 30, ER visits, labs, imaging results.  Primary care notes also reviewed.  She has been experiencing chronic nausea and diarrhea, with a history of testing positive for C. difficile and receiving treatment. I she was treated with vancomycin  twice and overall the  severe diarrhea related to C. difficile has resolved.  At this time she reports more frequent bowel movements than normal and occasional urgency. Abdominal pain is described as crampy and tight, occurring in the right upper quadrant, right lower quadrant, and sometimes across the abdomen. Nausea has led to a reported weight loss of 20 pounds over seven months due to difficulty eating related to nausea.  Because she was complaining of right upper quadrant pain and ultrasound was negative Eleanor Ponto, NP ordered a HIDA scan which was normal including ejection fraction and also had a normal or negative H. pylori stool antigen.  Cortisol level has been normal.  Ferritin is 10.1.  These are labs from September.  Thyroid  function tests are normal.  B12 level is normal.  Vitamin D  level is low.  These were ordered by Dr. Claudene of sports medicine.  He had seen her on September 11 for back and neck pain.  That note is reviewed as well.  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  She has a history of vertigo and dizziness, described as a constant pressure in her head, making her feel 'wobbly.' No vomiting is present, but nausea is frequent.  Saw Dr. Skeet of neurology and was sent for vestibular therapy recently and is scheduled for an MRI to evaluate small area of prior likely infarct seen on CT of the head.SABRA  He also wonders about her problems being a chronic vestibular migraine.  She was started on Aimovig after visit on 09/28/2024.  She reports an elevated heart rate, with the highest recorded at 120-121  bpm, and notes that her heart rate was over 100 bpm on recent mornings. She has experienced episodes where she felt like she might pass out, particularly when getting up to use the bathroom.  She has been taking sertraline  for anxiety, which was increased from 50 mg to 75 mg due to ongoing stress-related symptoms. She has also been prescribed dicyclomine , which she has taken once with  some relief, and has used Imodium  sparingly to manage diarrhea.  Her past medical history includes a previous endoscopy in 2019 for similar symptoms, and she has been tested for celiac disease with TTG levels, which was negative. She has been on omeprazole and is currently taking Pepcid . She has a history of iron deficiency, though her menstrual periods are regular and not heavy.  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 She has been working with a therapist for anxiety, which worsened during the COVID pandemic, leading to increased OCD behaviors related to cleaning.       Wt Readings from Last 3 Encounters:  10/05/24 117 lb (53.1 kg)  09/28/24 117 lb (53.1 kg)  09/21/24 116 lb (52.6 kg)  Patient weighed 120 pounds on July 22, 2024 and she was 134 pounds in January 2025   Allergies  Allergen Reactions   Penicillin G Rash   Sulfamethoxazole Rash   Current Meds  Medication Sig   dicyclomine  (BENTYL ) 20 MG tablet Take 1 tablet (20 mg total) by mouth 3 (three) times daily as needed for spasms.   Erenumab-aooe (AIMOVIG) 140 MG/ML SOAJ Inject 140 mg into the skin every 28 (twenty-eight) days.   famotidine  (PEPCID ) 20 MG tablet Take 1 tablet (20 mg total) by mouth 2 (two) times daily.   ferrous sulfate 325 (65 FE) MG tablet Take 325 mg by mouth daily with breakfast.   ibuprofen  (ADVIL ) 800 MG tablet Take 1 tablet (800 mg total) by mouth 3 (three) times daily with meals as needed for headache, moderate pain or cramping.   loperamide  (IMODIUM ) 2 MG capsule Take 1 capsule (2 mg total) by mouth 4 (four) times daily as needed for diarrhea or loose stools.   meclizine  (ANTIVERT ) 12.5 MG tablet Take 1 tablet (12.5 mg total) by mouth every 8 (eight) hours as needed for dizziness or nausea.   metoCLOPramide (REGLAN) 10 MG tablet Take one tablet by mouth 30 minutes prior to each prepping time for procedure.   ondansetron  (ZOFRAN ) 4 MG tablet Take 1 tablet (4 mg  total) by mouth every 8 (eight) hours as needed for nausea or vomiting.   potassium chloride  SA (KLOR-CON  M) 20 MEQ tablet Take 1 tablet (20 mEq total) by mouth daily.   Prenatal Vit-Fe Fumarate-FA (MULTIVITAMIN-PRENATAL) 27-0.8 MG TABS tablet Take 1 tablet by mouth daily at 12 noon.   sertraline  (ZOLOFT ) 50 MG tablet Take 1.5 tablets (75 mg total) by mouth daily.   Vitamin D , Ergocalciferol , (DRISDOL ) 1.25 MG (50000 UNIT) CAPS capsule Take 1 capsule (50,000 Units total) by mouth every 7 (seven) days.   Past Medical History:  Diagnosis Date   Acid reflux    Anemia    Anxiety    C. 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[facility])    Past Surgical History:  Procedure Laterality Date   BREAST BIOPSY Left 04/25/2023   US  LT BREAST BX W LOC DEV 1ST LESION IMG BX SPEC US  GUIDE 04/25/2023 [facility]   DILATATION & CURETTAGE/HYSTEROSCOPY  WITH MYOSURE N/A 03/19/2022   Procedure: DILATATION & CURETTAGE/HYSTEROSCOPY WITH MYOSURE;  Surgeon: Cleatus Moccasin, MD;  Location: [facility];  Service: Gynecology;  Laterality: N/A;   EYE SURGERY Bilateral    UPPER GASTROINTESTINAL ENDOSCOPY  2019   Social History   Social History Narrative   Right handed   family history includes Anemia in her mother; Angina in her paternal grandfather; Cancer in her paternal grandfather; Colon cancer in her maternal grandmother; Diabetes in her brother; GER disease in her father; Multiple sclerosis in her paternal grandmother; Ovarian cysts in her mother; Prostate cancer in her maternal grandfather; Stroke in her maternal grandmother.   Review of Systems As per HPI  Objective:   Physical Exam @BP  106/60   Pulse 89   Ht 5' 3 (1.6 m)   Wt 117 lb (53.1 kg)   LMP 09/22/2024 (Exact Date)   BMI 20.73 kg/m @  General:  NAD Eyes:   anicteric Lungs:  clear Heart::  S1S2 no rubs, murmurs or gallops Abdomen:  soft and mildly tender bilat LQ's, BS+ Ext:   no edema, cyanosis  or clubbing    Data Reviewed:  See HPI.  I spent 35 minutes of time, including in depth chart review, independent review of results as outlined above, communicating results with the patient directly, face-to-face time with the patient, coordinating care, ordering studies and medications as appropriate, and documentation.

## 2024-10-06 ENCOUNTER — Encounter: Payer: Self-pay | Admitting: Internal Medicine

## 2024-10-07 ENCOUNTER — Telehealth: Payer: Self-pay | Admitting: Internal Medicine

## 2024-10-07 NOTE — Telephone Encounter (Signed)
 Patient requesting to speak about prep . Please advise.

## 2024-10-08 NOTE — Telephone Encounter (Signed)
 Questions answered about Aimovig medicine for her migraines and if she could take it today. I told her that would be okay and we talked about condiments she wants to put on her food. They are fine 5 days out.

## 2024-10-12 ENCOUNTER — Ambulatory Visit: Payer: Self-pay | Admitting: Physical Therapy

## 2024-10-12 VITALS — BP 106/78

## 2024-10-12 DIAGNOSIS — R29818 Other symptoms and signs involving the nervous system: Secondary | ICD-10-CM | POA: Diagnosis not present

## 2024-10-12 DIAGNOSIS — R42 Dizziness and giddiness: Secondary | ICD-10-CM | POA: Diagnosis not present

## 2024-10-12 DIAGNOSIS — R2681 Unsteadiness on feet: Secondary | ICD-10-CM | POA: Diagnosis not present

## 2024-10-12 NOTE — Patient Instructions (Signed)
 Progress letter exercise up to 60 secs in standing - both directions  Pillow exercises - standing only - EO and EC - feet together  ADD HEAD TURNS - EO AND EC - FEET APART - 5-10 REPS HEAD TURNS EACH DIRECTION

## 2024-10-12 NOTE — Therapy (Unsigned)
 OUTPATIENT PHYSICAL THERAPY VESTIBULAR TREATMENT NOTE     Patient Name: Donna [name] MRN: [id number] DOB:December 08, 1991, 33 y.o., female Today's Date: 10/13/2024  END OF SESSION:  PT End of Session - 10/13/24 1928     Visit Number 2    Number of Visits 5   4 visits + eval   Date for Recertification  11/05/24    Authorization Type BC[employer]    PT Start Time [phone]    PT Stop Time 1016    PT Time Calculation (min) 43 min    Activity Tolerance Patient tolerated treatment well    Behavior During Therapy WFL for tasks assessed/performed           Past Medical History:  Diagnosis Date   Acid reflux    Anemia    Anxiety    C. 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[facility])    Past Surgical History:  Procedure Laterality Date   BREAST BIOPSY Left 04/25/2023   US  LT BREAST BX W LOC DEV 1ST LESION IMG BX SPEC US  GUIDE 04/25/2023 [facility]AMMOGR[facility]   DILATATION & CURETTAGE/HYSTEROSCOPY WITH MYOSURE N/A 03/19/2022   Procedure: DILATATION & CURETTAGE/HYSTEROSCOPY WITH MYOSURE;  Surgeon: Cleatus Moccasin, MD;  Location: [facility];  Service: Gynecology;  Laterality: N/A;   EYE SURGERY Bilateral    UPPER GASTROINTESTINAL ENDOSCOPY  2019   Patient Active Problem List   Diagnosis Date Noted   Fatigue 09/21/2024   Right upper quadrant abdominal pain 09/21/2024   Nausea 09/21/2024   Tachycardia 08/31/2024   C. 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  Pt has journaled days/times/activity of dizziness experienced since initial eval last week.  Pt reports she had headache on Sunday night - had to take 4 Tylenol . Pt states she has not eaten today because she has a colonoscopy scheduled tomorrow (on Wed., the 15th)  Pt reports the pain behind her Lt ear she had when she did gaze stabilization with horizontal head turns resolved - is still going to mention it to her sports medicine MD but is no longer an issue  Pt accompanied by: self - husband drove her to PT today  PERTINENT HISTORY: anemia, palpitations (negative cardiac workup), GERD, schizencephaly, tachycardia, h/o C. Diff colitis in August 2025  PAIN:  Are you having pain? Yes: NPRS scale: 2/10 Pain location: head - feels light pressure; abdominal pain - mild at this time Pain description: light pressure in head Aggravating factors: motion increases headache Relieving factors: medication, naps/lying down  PRECAUTIONS: None  RED FLAGS: None   WEIGHT BEARING RESTRICTIONS: No  FALLS: Has patient fallen in last 6 months? No  LIVING ENVIRONMENT: Lives with: lives  with their spouse Lives in: House/apartment  PLOF: Independent - pt is currently on FMLA  PATIENT GOALS: best way to manage the dizziness - so I can start functioning again  OBJECTIVE:  Note: Objective measures were completed at Evaluation unless otherwise noted.  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 MRI scheduled on 10-24-24  COGNITION: Overall cognitive status:  WFL's but pt reports memory issues, loses train of thought easily - must write things down  Cervical ROM:  WNL's   GAIT: Gait pattern: WFL Distance walked: 50' Assistive device utilized: None Level of assistance: Complete Independence  VESTIBULAR ASSESSMENT:  GENERAL OBSERVATION: pt is a 33 yr old female with c/o vertigo/dizziness; pt reports initial episode of vertigo occurred in 2023; another episode occurred in March 2025 and most recent in July 2025 but this episode was accompanied by 3 day onset of symptoms of diarrhea, dizziness and abdominal pain.     SYMPTOM BEHAVIOR:  Subjective history: most of time feels worse in the morning; wears contacts so is unable to see first thing in the morning  Non-Vestibular symptoms: neck pain, headaches, tinnitus, nausea/vomiting, and migraine symptoms  Type of dizziness: Spinning/Vertigo, Unsteady with head/body turns, Lightheadedness/Faint, and Funny feeling in the head  Frequency: daily - mostly  Duration: varies  Aggravating factors: Induced by motion: occur when walking, looking up at the ceiling, bending down to the ground, and quick head turns  Relieving factors: lying supine, medication, rest, slow movements, avoid busy/distracting environments, and new migraine medication just approved (Aimovig)  Progression of symptoms: worse  OCULOMOTOR EXAM:  Ocular Alignment: normal  Ocular ROM: No Limitations  Spontaneous Nystagmus: absent  Gaze-Induced Nystagmus: absent  Smooth Pursuits: intact  Saccades: intact  Convergence/Divergence: approx. 6    FRENZEL - FIXATION SUPRESSED:  Ocular Alignment: normal  Spontaneous Nystagmus: absent  Gaze-Induced Nystagmus: absent   Positional tests: Right Dix-Hallpike: no nystagmus Left Dix-Hallpike: no nystagmus Right Sidelying: no nystagmus Left Sidelying: no nystagmus c/o pressure with return to sitting   VESTIBULAR - OCULAR REFLEX:    Dynamic Visual Acuity: Static: line 10  Dynamic: line  7    POSITIONAL TESTING:  Right Dix-Hallpike: no nystagmus Left Dix-Hallpike: no nystagmus Right Sidelying: no nystagmus Left Sidelying: no nystagmus  MOTION SENSITIVITY:  Motion Sensitivity Quotient Intensity: 0 = none, 1 = Lightheaded, 2 = Mild, 3 = Moderate, 4 = Severe, 5 = Vomiting  Intensity  1. Sitting to supine   2. Supine to L side   3. Supine to R side   4. Supine to sitting   5. L Hallpike-Dix   6. Up from L    7. R Hallpike-Dix   8. Up from R    9. Sitting, head tipped to L knee   10. Head up from L knee   11. Sitting, head tipped to R knee   12. Head up from R knee   13. Sitting head turns x5 1  14.Sitting head nods x5 2 - > symptoms  15. In stance, 180 turn to L    16. In stance, 180 turn to R      FUNCTIONAL GAIT: MCTSIB: Condition 1: Avg of 3 trials: 30 sec, Condition 2: Avg of 3 trials: 30 sec, Condition 3: Avg of 3 trials: 30 sec, Condition 4: Avg of 3 trials: 30 sec, and Total Score: 120/120  Pt reported dizziness upon completion of condition 4 but did not  have LOB                                                                                                                           TREATMENT DATE: 10-12-24 Vitals:   10/12/24 0948  BP: 106/78    NeuroRe-ed:   Gaze stabilization - in standing:  horizontal head turns 30 secs x 1 rep; vertical head turns 30 secs x 1 rep in standing - target on plain background approx. 5' away  Habituation - pt bent over to simulate position with playing with her dog when she experienced dizziness doing this activity at home;  no dizziness reported in today's session  Positional testing; Rt and Lt sidelying tests (-) with no nystagmus and no c/o increased dizziness reported - pt had some dizziness prior to testing  Rolling supine to Lt sidelying to Rt sidelying to Lt sidelying; no c/o increased dizziness reported  Pt amb. Approx. 30' making circles CW and CCW with ball for visual tracking; tossing and catching ball  30' x 1 rep: tossing and catching on Rt/Lt sides 30' x 1 rep  Standing Balance: Surface: Airex Position: Feet Hip Width Apart Wide Base of Support Completed with: Eyes Open and Eyes Closed; Head Turns x 5 Reps and Head Nods x 5 Reps  Self Care:  educated pt in habituation: gave article from [employer] - [employer] syndrome; also gave DHI to be completed  HEP Progressions:  Progress letter exercise up to 60 secs in standing - both directions  Pillow exercises - standing only - EO and EC - feet together  ADD HEAD TURNS - EO AND EC - FEET APART - 5-10 REPS HEAD TURNS EACH DIRECTION  Access Code: [email or web address] URL: [email or web address] Date: 10/04/2024 Prepared by: Rock Kussmaul  Exercises - Standing Gaze Stabilization with Head Rotation  - 3-5 x daily - 7 x weekly - 1 sets - 1 reps - 30-60 hold - Standing Gaze Stabilization with Head Nod  - 3-5 x daily - 7 x weekly - 1 sets - 1 reps - 30-60 hold - Standing on foam pad - Eyes open and then with Eyes closed   - 1 x daily - 7 x weekly - 1 sets - 1 reps - 30 sec hold  PATIENT EDUCATION: Education details: progressed exercises - see above; gave article from [employer] on supermarket syndrome Person educated: Patient Education method: Explanation, Demonstration, and Handouts Education comprehension: verbalized understanding and returned demonstration  HOME EXERCISE PROGRAM:  [employer]   GOALS: Goals reviewed with patient? Yes  SHORT TERM GOALS: same as LTG's as ELOS = 4 wks   LONG TERM GOALS: Target date: 11-05-24  Improve DVA to </= 2 line difference for improved gaze stabilization. Baseline: line 10/line 7 (3 line difference) Goal status: INITIAL  2.  Pt will perform vertical head turns (5 reps) in seated position with c/o dizziness </= 1/5 intensity. Baseline: 2/5 Goal status: INITIAL  3.  Pt will stand for 30 secs  on condition 4 of mCTSIB with no c/o dizziness to demo increased vestibular input in maintaining balance.   Baseline: increased symptoms provoked after standing for 30 secs with min. Postural sway Goal status: INITIAL  4.  Independent in HEP for vestibular exercises. Baseline:  Goal status: INITIAL  5.  Pt will subjectively report at least 25% improvement in dizziness.  Baseline:  Goal status: INITIAL  ASSESSMENT:  CLINICAL IMPRESSION: PT session focused on vestibular exercises including habituation, visual exercises including tracking ball during gait and gaze stabilization.  Pt reported dizziness increased to 5-6/10 intensity during session with some activities but able to take short seated rest break to allow dizziness to decrease and continue with session.  Cont with POC.    OBJECTIVE IMPAIRMENTS: decreased balance, dizziness, and pain.   ACTIVITY LIMITATIONS: locomotion level  PARTICIPATION LIMITATIONS: interpersonal relationship, driving, and community activity  PERSONAL FACTORS: Behavior pattern, Past/current experiences, Time since onset of injury/illness/exacerbation, and 1-2 comorbidities: h/o migraines  & schizencephaly are also affecting patient's functional outcome.   REHAB POTENTIAL: Good  CLINICAL DECISION MAKING: Evolving/moderate complexity  EVALUATION COMPLEXITY: Moderate   PLAN:  PT FREQUENCY: 1x/week  PT DURATION: 4 weeks + eval  PLANNED INTERVENTIONS: 97110-Therapeutic exercises, 97530- Therapeutic activity, [id number]- Neuromuscular re-education, [phone]- Self Care, and [id number]- Gait training  PLAN FOR NEXT SESSION: do SOT:  calculate DHI:  check HEP progressions   Jaylee Lantry, [place], PT 10/13/2024, 7:29 PM

## 2024-10-13 ENCOUNTER — Encounter: Payer: Self-pay | Admitting: Internal Medicine

## 2024-10-13 ENCOUNTER — Ambulatory Visit (AMBULATORY_SURGERY_CENTER): Admitting: Internal Medicine

## 2024-10-13 ENCOUNTER — Encounter: Payer: Self-pay | Admitting: Physical Therapy

## 2024-10-13 VITALS — BP 118/82 | HR 94 | Temp 97.4°F | Resp 12 | Ht 63.0 in | Wt 117.0 lb

## 2024-10-13 DIAGNOSIS — K2970 Gastritis, unspecified, without bleeding: Secondary | ICD-10-CM | POA: Diagnosis not present

## 2024-10-13 DIAGNOSIS — K644 Residual hemorrhoidal skin tags: Secondary | ICD-10-CM

## 2024-10-13 DIAGNOSIS — R197 Diarrhea, unspecified: Secondary | ICD-10-CM | POA: Diagnosis not present

## 2024-10-13 DIAGNOSIS — K2950 Unspecified chronic gastritis without bleeding: Secondary | ICD-10-CM | POA: Diagnosis not present

## 2024-10-13 DIAGNOSIS — K648 Other hemorrhoids: Secondary | ICD-10-CM

## 2024-10-13 DIAGNOSIS — R634 Abnormal weight loss: Secondary | ICD-10-CM

## 2024-10-13 DIAGNOSIS — R110 Nausea: Secondary | ICD-10-CM

## 2024-10-13 MED ORDER — SODIUM CHLORIDE 0.9 % IV SOLN: 500.0000 mL | Freq: Once | INTRAVENOUS | Status: DC

## 2024-10-13 MED ORDER — SODIUM CHLORIDE 0.9 % IV SOLN
4.0000 mg | Freq: Once | INTRAVENOUS | Status: AC
  Administered 2024-10-13: 4 mg via INTRAVENOUS

## 2024-10-13 NOTE — Progress Notes (Signed)
 A/o x 3, VSS, good SR's, pleased with anesthesia, report to RN

## 2024-10-13 NOTE — Op Note (Signed)
 [facility] Patient Name: Donna [name] Procedure Date: 10/13/2024 8:57 AM MRN: [id number] Endoscopist: Lupita FORBES Commander , MD, [id number] Age: 33 Referring MD:  Date of Birth: 02-Oct-1991 Gender: Female Account #: [id number] Procedure:                Upper GI endoscopy Indications:              Nausea, Weight loss Medicines:                Monitored Anesthesia Care Procedure:                Pre-Anesthesia Assessment:                           - Prior to the procedure, a History and Physical                            was performed, and patient medications and                            allergies were reviewed. The patient's tolerance of                            previous anesthesia was also reviewed. The risks                            and benefits of the procedure and the sedation                            options and risks were discussed with the patient.                            All questions were answered, and informed consent                            was obtained. Prior Anticoagulants: The patient has                            taken no anticoagulant or antiplatelet agents. ASA                            Grade Assessment: II - A patient with mild systemic                            disease. After reviewing the risks and benefits,                            the patient was deemed in satisfactory condition to                            undergo the procedure.                           After obtaining informed consent, the endoscope was  passed under direct vision. Throughout the                            procedure, the patient's blood pressure, pulse, and                            oxygen saturations were monitored continuously. The                            GIF [id number] was introduced through the                            mouth, and advanced to the second part of duodenum.                            The upper GI endoscopy was  accomplished without                            difficulty. The patient tolerated the procedure                            well. Scope In: Scope Out: Findings:                 Patchy mildly erythematous mucosa without bleeding                            was found in the gastric antrum. Biopsies were                            taken with a cold forceps for histology.                            Verification of patient identification for the                            specimen was done. Estimated blood loss was minimal.                           The gastroesophageal flap valve was visualized                            endoscopically and classified as Hill Grade II                            (fold present, opens with respiration).                           The exam was otherwise without abnormality.                           The cardia and gastric fundus were normal on                            retroflexion. Complications:  No immediate complications. Estimated Blood Loss:     Estimated blood loss was minimal. Impression:               - Erythematous mucosa in the antrum. Biopsied.                           - Gastroesophageal flap valve classified as Hill                            Grade II (fold present, opens with respiration).                           - The examination was otherwise normal. Recommendation:           - Patient has a contact number available for                            emergencies. The signs and symptoms of potential                            delayed complications were discussed with the                            patient. Return to normal activities tomorrow.                            Written discharge instructions were provided to the                            patient.                           - Resume previous diet.                           - Continue present medications.                           - See the other procedure note for documentation of                             additional recommendations.                           - Await pathology results. Lupita FORBES Commander, MD 10/13/2024 9:29:48 AM This report has been signed electronically.

## 2024-10-13 NOTE — Op Note (Signed)
 [facility] Patient Name: Donna [name] Procedure Date: 10/13/2024 8:56 AM MRN: [id number] Endoscopist: Lupita FORBES Commander , MD, [id number] Age: 33 Referring MD:  Date of Birth: 1991-01-22 Gender: Female Account #: [id number] Procedure:                Colonoscopy Indications:              Clinically significant diarrhea of unexplained                            origin Medicines:                Monitored Anesthesia Care Procedure:                Pre-Anesthesia Assessment:                           - Prior to the procedure, a History and Physical                            was performed, and patient medications and                            allergies were reviewed. The patient's tolerance of                            previous anesthesia was also reviewed. The risks                            and benefits of the procedure and the sedation                            options and risks were discussed with the patient.                            All questions were answered, and informed consent                            was obtained. Prior Anticoagulants: The patient has                            taken no anticoagulant or antiplatelet agents. ASA                            Grade Assessment: II - A patient with mild systemic                            disease. After reviewing the risks and benefits,                            the patient was deemed in satisfactory condition to                            undergo the procedure.  After obtaining informed consent, the colonoscope                            was passed under direct vision. Throughout the                            procedure, the patient's blood pressure, pulse, and                            oxygen saturations were monitored continuously. The                            Olympus Scope [id number] was introduced through the                            anus and advanced to the the terminal ileum, with                             identification of the appendiceal orifice and IC                            valve. The colonoscopy was performed without                            difficulty. The patient tolerated the procedure                            well. The quality of the bowel preparation was                            good. The terminal ileum, ileocecal valve,                            appendiceal orifice, and rectum were photographed.                            The bowel preparation used was SUPREP via split                            dose instruction. Scope In: 9:06:24 AM Scope Out: 9:18:58 AM Scope Withdrawal Time: 0 hours 8 minutes 44 seconds  Total Procedure Duration: 0 hours 12 minutes 34 seconds  Findings:                 The perianal and digital rectal examinations were                            normal except for swollen hemorrhoid.                           The terminal ileum appeared normal.                           Internal hemorrhoids were found.  The exam was otherwise without abnormality on                            direct and retroflexion views.                           Biopsies for histology were taken with a cold                            forceps from the entire colon for evaluation of                            microscopic colitis. Complications:            No immediate complications. Estimated Blood Loss:     Estimated blood loss was minimal. Impression:               - The examined portion of the ileum was normal.                           - Internal/External hemorrhoids.                           - The examination was otherwise normal on direct                            and retroflexion views.                           - Biopsies were taken with a cold forceps from the                            entire colon for evaluation of microscopic colitis. Recommendation:           - Patient has a contact number available for                             emergencies. The signs and symptoms of potential                            delayed complications were discussed with the                            patient. Return to normal activities tomorrow.                            Written discharge instructions were provided to the                            patient.                           - Resume previous diet.                           - Continue present medications.                           -  Await pathology results.                           - No recommendation at this time regarding repeat                            colonoscopy due to young age. Lupita FORBES Commander, MD 10/13/2024 9:35:12 AM This report has been signed electronically.

## 2024-10-13 NOTE — Patient Instructions (Addendum)
 Overall things look good.  There is some mild red color change in the stomach that is quite common.  As we frequently do I took biopsies in the stomach to look for inflammation.  Colonoscopy was normal other than some small internal hemorrhoids.  These are also common and normal.  I took biopsies of the lining to check for inflammation.  Once I get the results I will contact you with further recommendations and follow-up plans.  While there is no clear answer from what I saw this is very good news and that it does not appear that you have any major chronic inflammatory condition in the areas of gastrointestinal tract seen today.  I appreciate the opportunity to care for you. Lupita CHARLENA Commander, MD, [facility]   Handouts given: Hemorrhoids Resume previous diet. Continue present medications.  Await pathology results. No recommendation at this time regarding repeat colonoscopy due to young age.   YOU HAD AN ENDOSCOPIC PROCEDURE TODAY AT THE [facility]:   Refer to the procedure report that was given to you for any specific questions about what was found during the examination.  If the procedure report does not answer your questions, please call your gastroenterologist to clarify.  If you requested that your care partner not be given the details of your procedure findings, then the procedure report has been included in a sealed envelope for you to review at your convenience later.  YOU SHOULD EXPECT: Some feelings of bloating in the abdomen. Passage of more gas than usual.  Walking can help get rid of the air that was put into your GI tract during the procedure and reduce the bloating. If you had a lower endoscopy (such as a colonoscopy or flexible sigmoidoscopy) you may notice spotting of blood in your stool or on the toilet paper. If you underwent a bowel prep for your procedure, you may not have a normal bowel movement for a few days.  Please Note:  You might notice some irritation and  congestion in your nose or some drainage.  This is from the oxygen used during your procedure.  There is no need for concern and it should clear up in a day or so.  SYMPTOMS TO REPORT IMMEDIATELY:  Following lower endoscopy (colonoscopy or flexible sigmoidoscopy):  Excessive amounts of blood in the stool  Significant tenderness or worsening of abdominal pains  Swelling of the abdomen that is new, acute  Fever of 100F or higher  Following upper endoscopy (EGD)  Vomiting of blood or coffee ground material  New chest pain or pain under the shoulder blades  Painful or persistently difficult swallowing  New shortness of breath  Fever of 100F or higher  Black, tarry-looking stools  For urgent or emergent issues, a gastroenterologist can be reached at any hour by calling [phone]. Do not use MyChart messaging for urgent concerns.    DIET:  We do recommend a small meal at first, but then you may proceed to your regular diet.  Drink plenty of fluids but you should avoid alcoholic beverages for 24 hours.  ACTIVITY:  You should plan to take it easy for the rest of today and you should NOT DRIVE or use heavy machinery until tomorrow (because of the sedation medicines used during the test).    FOLLOW UP: Our staff will call the number listed on your records the next business day following your procedure.  We will call around 7:15- 8:00 am to check on you and address any  questions or concerns that you may have regarding the information given to you following your procedure. If we do not reach you, we will leave a message.     If any biopsies were taken you will be contacted by phone or by letter within the next 1-3 weeks.  Please call us  at [phone] if you have not heard about the biopsies in 3 weeks.    SIGNATURES/CONFIDENTIALITY: You and/or your care partner have signed paperwork which will be entered into your electronic medical record.  These signatures attest to the fact that  that the information above on your After Visit Summary has been reviewed and is understood.  Full responsibility of the confidentiality of this discharge information lies with you and/or your care-partner.

## 2024-10-13 NOTE — Progress Notes (Signed)
 Called to room to assist during endoscopic procedure.  Patient ID and intended procedure confirmed with present staff. Received instructions for my participation in the procedure from the performing physician.

## 2024-10-13 NOTE — Progress Notes (Signed)
 Patient c/o of nausea. Zofran  4mg  IV by Laneta Speaks RN. Lying in bed resting with eyes closed. Dr. Avram in to speak with patient. Will continue to monitor.

## 2024-10-13 NOTE — Progress Notes (Signed)
 History and Physical Interval Note:  10/13/2024 8:52 AM  Donna [name]  has presented today for endoscopic procedure(s), with the diagnosis of  Encounter Diagnoses  Name Primary?   Diarrhea, unspecified type Yes   Nausea without vomiting   .  The various methods of evaluation and treatment have been discussed with the patient and/or family. After consideration of risks, benefits and other options for treatment, the patient has consented to  the endoscopic procedure(s).   The patient's history has been reviewed, patient examined, no change in status, stable for endoscopic procedure(s).  I have reviewed the patient's chart and labs.  Questions were answered to the patient's satisfaction.     Lupita CHARLENA Commander, MD, [facility]

## 2024-10-13 NOTE — Progress Notes (Signed)
 Pt's states no medical or surgical changes since previsit or office visit.

## 2024-10-14 ENCOUNTER — Telehealth: Payer: Self-pay | Admitting: *Deleted

## 2024-10-14 DIAGNOSIS — F4322 Adjustment disorder with anxiety: Secondary | ICD-10-CM | POA: Diagnosis not present

## 2024-10-14 NOTE — Telephone Encounter (Signed)
  Follow up Call-     10/13/2024    7:51 AM  Call back number  Post procedure Call Back phone  # [phone]  Permission to leave phone message Yes     Patient questions:  Do you have a fever, pain , or abdominal swelling? No. Pain Score  0 *  Have you tolerated food without any problems? Yes.    Have you been able to return to your normal activities? Yes.    Do you have any questions about your discharge instructions: Diet   No. Medications  No. Follow up visit  No.  Do you have questions or concerns about your Care? No.  Actions: * If pain score is 4 or above: No action needed, pain <4.

## 2024-10-15 LAB — SURGICAL PATHOLOGY

## 2024-10-19 ENCOUNTER — Ambulatory Visit: Payer: Self-pay | Admitting: Physical Therapy

## 2024-10-19 DIAGNOSIS — R29818 Other symptoms and signs involving the nervous system: Secondary | ICD-10-CM | POA: Diagnosis not present

## 2024-10-19 DIAGNOSIS — R2681 Unsteadiness on feet: Secondary | ICD-10-CM | POA: Diagnosis not present

## 2024-10-19 DIAGNOSIS — R42 Dizziness and giddiness: Secondary | ICD-10-CM | POA: Diagnosis not present

## 2024-10-19 NOTE — Therapy (Unsigned)
 OUTPATIENT PHYSICAL THERAPY VESTIBULAR TREATMENT NOTE     Patient Name: Donna [name] MRN: [id number] DOB:[birth year], 33 y.o., female Today's Date: 10/22/2024  END OF SESSION:  PT End of Session - 10/22/24 1027     Visit Number 3    Number of Visits 5   4 visits + eval   Date for Recertification  11/05/24    Authorization Type [employer]    PT Start Time 0932    PT Stop Time 1015    PT Time Calculation (min) 43 min    Activity Tolerance Patient tolerated treatment well    Behavior During Therapy WFL for tasks assessed/performed            Past Medical History:  Diagnosis Date   Acid reflux    Anemia    Anxiety    C. 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[facility])    Past Surgical History:  Procedure Laterality Date   BREAST BIOPSY Left 04/25/2023   US  LT BREAST BX W LOC DEV 1ST LESION IMG BX SPEC US  GUIDE 04/25/2023 [facility]   DILATATION & CURETTAGE/HYSTEROSCOPY WITH MYOSURE N/A 03/19/2022   Procedure: DILATATION & CURETTAGE/HYSTEROSCOPY WITH MYOSURE;  Surgeon: Cleatus Moccasin, MD;  Location: [facility] [place];  Service: Gynecology;  Laterality: N/A;   EYE SURGERY Bilateral    UPPER GASTROINTESTINAL ENDOSCOPY  2019   Patient Active Problem List   Diagnosis Date Noted   Fatigue 09/21/2024   Right upper quadrant abdominal pain 09/21/2024   Nausea 09/21/2024   Tachycardia 08/31/2024   C. 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   Migraine without aura and without status migrainosus, not intractable 12/15/2018   Anxiety 05/22/2018   Palpitations 05/18/2018   Globus sensation 01/15/2017    PCP: Daryl Setter, NP REFERRING PROVIDER: Skeet Juliene SAUNDERS, DO  REFERRING DIAG: R42 (ICD-10-CM) - Vertigo  THERAPY DIAG:  Dizziness and giddiness  Unsteadiness on feet  ONSET DATE: July 2025  Rationale for Evaluation and Treatment: Rehabilitation  SUBJECTIVE:   SUBJECTIVE STATEMENT: Pt reports she has been getting a sharp pain in her head above her left ear when she was doing the gaze stabilization exercise with horizontal head turns, so she has not been doing this exercise - is going to discuss with her doctor to see what could be causing it.  Has started some recently - drove today with husband in the car.  Has MRI scheduled on Sunday, the 26th. Completed DHI - score 64%  Pt accompanied by: self - husband drove her to PT today  PERTINENT HISTORY: anemia, palpitations (negative cardiac workup), GERD, schizencephaly, tachycardia, h/o C. Diff colitis in August 2025  PAIN:  Are you having pain? Yes: NPRS scale: 5/10 Pain location: head - feels light pressure; abdominal pain - mild at this time Pain description: light pressure in head Aggravating factors: motion increases headache Relieving factors: medication, naps/lying down Pain on Lt side behind ear   PRECAUTIONS: None  RED FLAGS: None   WEIGHT BEARING RESTRICTIONS: No  FALLS: Has patient fallen in last 6 months? No  LIVING ENVIRONMENT: Lives with: lives with their spouse Lives in: House/apartment  PLOF: Independent - pt is currently on FMLA  PATIENT GOALS: best way  to manage the dizziness - so I can start functioning again  OBJECTIVE:  Note: Objective measures were completed at Evaluation unless otherwise noted.  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 MRI scheduled on 10-24-24  COGNITION: Overall cognitive status: WFL's but pt reports memory issues, loses train of thought easily - must write things down  Cervical ROM:  WNL's   GAIT: Gait pattern:  WFL Distance walked: 55' Assistive device utilized: None Level of assistance: Complete Independence  VESTIBULAR ASSESSMENT:  GENERAL OBSERVATION: pt is a 33 yr old female with c/o vertigo/dizziness; pt reports initial episode of vertigo occurred in 2023; another episode occurred in March 2025 and most recent in July 2025 but this episode was accompanied by 3 day onset of symptoms of diarrhea, dizziness and abdominal pain.     SYMPTOM BEHAVIOR:  Subjective history: most of time feels worse in the morning; wears contacts so is unable to see first thing in the morning  Non-Vestibular symptoms: neck pain, headaches, tinnitus, nausea/vomiting, and migraine symptoms  Type of dizziness: Spinning/Vertigo, Unsteady with head/body turns, Lightheadedness/Faint, and Funny feeling in the head  Frequency: daily - mostly  Duration: varies  Aggravating factors: Induced by motion: occur when walking, looking up at the ceiling, bending down to the ground, and quick head turns  Relieving factors: lying supine, medication, rest, slow movements, avoid busy/distracting environments, and new migraine medication just approved (Aimovig)  Progression of symptoms: worse  OCULOMOTOR EXAM:  Ocular Alignment: normal  Ocular ROM: No Limitations  Spontaneous Nystagmus: absent  Gaze-Induced Nystagmus: absent  Smooth Pursuits: intact  Saccades: intact  Convergence/Divergence: approx. 6    FRENZEL - FIXATION SUPRESSED:  Ocular Alignment: normal  Spontaneous Nystagmus: absent  Gaze-Induced Nystagmus: absent   Positional tests: Right Dix-Hallpike: no nystagmus Left Dix-Hallpike: no nystagmus Right Sidelying: no nystagmus Left Sidelying: no nystagmus c/o pressure with return to sitting   VESTIBULAR - OCULAR REFLEX:    Dynamic Visual Acuity: Static: line 10  Dynamic: line 7    POSITIONAL TESTING:  Right Dix-Hallpike: no nystagmus Left Dix-Hallpike: no nystagmus Right Sidelying: no nystagmus Left Sidelying:  no nystagmus  MOTION SENSITIVITY:  Motion Sensitivity Quotient Intensity: 0 = none, 1 = Lightheaded, 2 = Mild, 3 = Moderate, 4 = Severe, 5 = Vomiting  Intensity  1. Sitting to supine   2. Supine to L side   3. Supine to R side   4. Supine to sitting   5. L Hallpike-Dix   6. Up from L    7. R Hallpike-Dix   8. Up from R    9. Sitting, head tipped to L knee   10. Head up from L knee   11. Sitting, head tipped to R knee   12. Head up from R knee   13. Sitting head turns x5 1  14.Sitting head nods x5 2 - > symptoms  15. In stance, 180 turn to L    16. In stance, 180 turn to R      FUNCTIONAL GAIT: MCTSIB: Condition 1: Avg of 3 trials: 30 sec, Condition 2: Avg of 3 trials: 30 sec, Condition 3: Avg of 3 trials: 30 sec, Condition 4: Avg of 3 trials: 30 sec, and Total Score: 120/120  Pt reported dizziness upon completion of condition 4 but did not have LOB  TREATMENT DATE: 10-19-24  TherAct:  Sensory Organization Test: composite score 64/100 (N=70/100)  Somatosensory WNL's Visual - decreased at 56/100 from Normal of 73/100 Vestibular WNL's   Condition 1 - trials 1 & 2 WNL's: trial 3 slightly decreased from normal Condition 2 - all 3 trials WNL's Condition 3 - trials 1 & 3 slightly decreased from N; trial 2 WNL's Condition 4 - all 3 trials decreased from N Condition 5 - trial 1 decreased slightly; trials 2 & 3 WNL's Condition 6 - trial 1 decreased slightly; trials 2 & 3 WNL's  Standing Balance: Surface: Airex Position: Feet Hip Width Apart Wide Base of Support Completed with: Eyes Open and Eyes Closed; Head Turns x 5 Reps and Head Nods x 5 Reps  Self Care:  discussed DHI score of 64% (severe handicap category);  educated pt in results of SOT with result showing decreased visual input only, with somatosensory and vestibular WNL's  Recommended pt to  continue to hold on doing gaze stabilization exercise until she discusses the Lt sided pain in head with her MD - as horizontal head turns with doing this exercise provokes - pt verbalizes understanding & agreement  HEP Progressions:  Progress letter exercise up to 60 secs in standing - both directions  Pillow exercises - standing only - EO and EC - feet together  ADD HEAD TURNS - EO AND EC - FEET APART - 5-10 REPS HEAD TURNS EACH DIRECTION  Access Code: [email or web address] URL: [email or web address] Date: 10/04/2024 Prepared by: Rock Kussmaul  Exercises - Standing Gaze Stabilization with Head Rotation  - 3-5 x daily - 7 x weekly - 1 sets - 1 reps - 30-60 hold - Standing Gaze Stabilization with Head Nod  - 3-5 x daily - 7 x weekly - 1 sets - 1 reps - 30-60 hold - Standing on foam pad - Eyes open and then with Eyes closed   - 1 x daily - 7 x weekly - 1 sets - 1 reps - 30 sec hold  PATIENT EDUCATION: Education details: progressed exercises - see above; gave article from [employer] on supermarket syndrome Person educated: Patient Education method: Explanation, Demonstration, and Handouts Education comprehension: verbalized understanding and returned demonstration  HOME EXERCISE PROGRAM:  Medbridge   GOALS: Goals reviewed with patient? Yes  SHORT TERM GOALS: same as LTG's as ELOS = 4 wks   LONG TERM GOALS: Target date: 11-05-24  Improve DVA to </= 2 line difference for improved gaze stabilization. Baseline: line 10/line 7 (3 line difference) Goal status: INITIAL  2.  Pt will perform vertical head turns (5 reps) in seated position with c/o dizziness </= 1/5 intensity. Baseline: 2/5 Goal status: INITIAL  3.  Pt will stand for 30 secs on condition 4 of mCTSIB with no c/o dizziness to demo increased vestibular input in maintaining balance.  Baseline: increased symptoms provoked after standing for 30 secs with min. Postural sway Goal status: INITIAL  4.  Independent in HEP for  vestibular exercises. Baseline:  Goal status: INITIAL  5.  Pt will subjectively report at least 25% improvement in dizziness.  Baseline:  Goal status: INITIAL  ASSESSMENT:  CLINICAL IMPRESSION: PT session focused on assessment of balance with SOT; results indicate decreased visual input used in maintaining balance with somatosensory and vestibular input scores WNL's.  Pt's composite score = 64/100 with N= 70/100.  Pt reported moderate dizziness upon completion of this test.  Pt's DHI score = 64% (severe handicap category).  Pt is scheduled for MRI  on 10-24-24.  Cont with POC.    OBJECTIVE IMPAIRMENTS: decreased balance, dizziness, and pain.   ACTIVITY LIMITATIONS: locomotion level  PARTICIPATION LIMITATIONS: interpersonal relationship, driving, and community activity  PERSONAL FACTORS: Behavior pattern, Past/current experiences, Time since onset of injury/illness/exacerbation, and 1-2 comorbidities: h/o migraines  & schizencephaly are also affecting patient's functional outcome.   REHAB POTENTIAL: Good  CLINICAL DECISION MAKING: Evolving/moderate complexity  EVALUATION COMPLEXITY: Moderate   PLAN:  PT FREQUENCY: 1x/week  PT DURATION: 4 weeks + eval  PLANNED INTERVENTIONS: 97110-Therapeutic exercises, 97530- Therapeutic activity, [id number]- Neuromuscular re-education, 97535- Self Care, and [id number]- Gait training  PLAN FOR NEXT SESSION:  check HEP progressions, check LTG's; MRI results?   [place]anna [place], PT 10/22/2024, 10:30 AM

## 2024-10-20 ENCOUNTER — Ambulatory Visit: Payer: Self-pay | Admitting: Internal Medicine

## 2024-10-21 ENCOUNTER — Other Ambulatory Visit: Payer: Self-pay | Admitting: Internal Medicine

## 2024-10-21 MED ORDER — MIRTAZAPINE 15 MG PO TABS: 15.0000 mg | ORAL_TABLET | Freq: Every day | ORAL | 1 refills | Status: AC

## 2024-10-22 ENCOUNTER — Telehealth: Payer: Self-pay | Admitting: Gastroenterology

## 2024-10-22 ENCOUNTER — Encounter: Payer: Self-pay | Admitting: Physical Therapy

## 2024-10-22 NOTE — Progress Notes (Signed)
 Donna [name] [name] [address] [place] [address] Phone: [phone] Subjective:   Donna [name] am a scribe for Dr. Claudene.   I'm seeing this patient by the request  of:  Daryl Setter, NP  CC: Back and neck pain  YEP:[name]  Donna [name] is a 33 y.o. female coming in with complaint of back and neck pain. OMT on 09/09/2024. Patient states that the back and neck are rough today. Not sure what she did but something is wrong.   Medications patient has been prescribed: Vit D  Taking: yes     Laboratory workup did show that vitamin D  was even lower than previously.  Ferritin has made some mild improvement but still at 10.  Iron barely within normal limits.    Reviewed prior external information including notes and imaging from previsou exam, outside providers and external EMR if available.   As well as notes that were available from care everywhere and other healthcare systems.  Past medical history, social, surgical and family history all reviewed in electronic medical record.  No pertanent information unless stated regarding to the chief complaint.   Past Medical History:  Diagnosis Date   Acid reflux    Anemia    Anxiety    C. 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(HCC)     Allergies  Allergen Reactions   Penicillin G Rash   Sulfamethoxazole Rash    Review of Systems:  No , visual changes, nausea, vomiting, diarrhea, constipation, , abdominal pain, skin rash, fevers, chills, night sweats, weight loss, swollen lymph nodes, body aches, joint swelling, chest pain, shortness of breath, mood changes. POSITIVE muscle aches, dizziness headache  Objective  Blood pressure 120/60, pulse 79, height 5' 3 (1.6 m), weight 122 lb 9.6 oz (55.6 kg), last menstrual period 09/22/2024, SpO2 100%.   General: No apparent distress alert and oriented x3 mood and affect normal, dressed appropriately.  HEENT: Pupils  equal, extraocular movements intact  Respiratory: Patient's speak in full sentences and does not appear short of breath  Cardiovascular: No lower extremity edema, non tender, no erythema  Gait relatively normal MSK:  Back does have some mild loss of lordosis noted.  Some mild scoliosis noted.  Osteopathic findings  C4 flexed rotated and side bent right C6 flexed rotated and side bent left T3 extended rotated and side bent right inhaled rib T7 extended rotated and side bent left L2 flexed rotated and side bent right L3 flexed rotated sidebent left Sacrum right on right    Assessment and Plan:  Thoracogenic scoliosis of thoracolumbar region Patient has done relatively well overall.  We still now are having the further workup of patient's dizziness and lightheadedness.  Discussed with patient about icing regimen and home exercises, which activities to do and which ones to avoid.  Increase activity slowly.  Discussed icing regimen.  Follow-up again in 6 to 8 weeks.    Nonallopathic problems  Decision today to treat with OMT was based on Physical Exam  After verbal consent patient was treated with , ME, FPR techniques in cervical, rib, thoracic, lumbar, and sacral  areas avoided HVLA while awaiting MRI report.  Responded extremely well though to muscle energy.  Patient tolerated the procedure well with improvement in symptoms  Patient given exercises, stretches and lifestyle modifications  See medications in patient instructions if given  Patient will follow up in 4-8 weeks     The above documentation  has been reviewed and is accurate and complete Donna [name] M Donna Sickinger, DO         Note: This dictation was prepared with Dragon dictation along with smaller phrase technology. Any transcriptional errors that result from this process are unintentional.

## 2024-10-23 NOTE — Telephone Encounter (Signed)
 Called by patient late this evening bleeding per rectum/on toilet paper. Patient's chart reviewed. Recent EGD/colonoscopy greater than 1 week ago. Patient was having resolution of symptoms of diarrheal symptoms. Had recent menses and saw blood yesterday but then today while wiping she noticed blood per rectum. Bowels are brown in color. She feels a knot/nodule in the perianal. She has some slight discomfort in the area of her anal canal.  She was worried about the potential of an underlying cancer process. I told her this was unlikely based on recent full colonoscopy within the last 2 weeks. I told her to initiate Preparation H 2-3 times daily. I told her to initiate RectiCare up to the first knuckle/digit twice daily in case she could have a fissure. If things progress or worsen over the course the weekend she can certainly call back. Will send message to pod C team for Dr. Avram to follow-up on Monday to see how patient is doing.  She was appreciative for the call back.   Aloha Finner, MD [facility] Office # [phone]

## 2024-10-24 ENCOUNTER — Ambulatory Visit
Admission: RE | Admit: 2024-10-24 | Discharge: 2024-10-24 | Disposition: A | Discharge status: Home / Self Care | Source: Ambulatory Visit | Attending: Neurology | Admitting: Neurology

## 2024-10-24 DIAGNOSIS — G43111 Migraine with aura, intractable, with status migrainosus: Secondary | ICD-10-CM

## 2024-10-24 DIAGNOSIS — R9089 Other abnormal findings on diagnostic imaging of central nervous system: Secondary | ICD-10-CM

## 2024-10-24 MED ORDER — GADOPICLENOL 0.5 MMOL/ML IV SOLN
5.0000 mL | Freq: Once | INTRAVENOUS | Status: AC | PRN
  Administered 2024-10-24: 5 mL via INTRAVENOUS

## 2024-10-25 ENCOUNTER — Ambulatory Visit: Payer: Self-pay | Admitting: Physical Therapy

## 2024-10-25 ENCOUNTER — Encounter: Payer: Self-pay | Admitting: Neurology

## 2024-10-25 ENCOUNTER — Encounter: Payer: Self-pay | Admitting: Physical Therapy

## 2024-10-25 DIAGNOSIS — R2681 Unsteadiness on feet: Secondary | ICD-10-CM | POA: Diagnosis not present

## 2024-10-25 DIAGNOSIS — R42 Dizziness and giddiness: Secondary | ICD-10-CM

## 2024-10-25 DIAGNOSIS — R29818 Other symptoms and signs involving the nervous system: Secondary | ICD-10-CM | POA: Diagnosis not present

## 2024-10-25 NOTE — Therapy (Signed)
 OUTPATIENT PHYSICAL THERAPY VESTIBULAR TREATMENT NOTE     Patient Name: Donna [name] MRN: [id number] DOB:[birth year], 33 y.o., female Today's Date: 10/25/2024  END OF SESSION:  PT End of Session - 10/25/24 1845     Visit Number 4    Number of Visits 5   4 visits + eval   Date for Recertification  11/05/24    Authorization Type BCBS    PT Start Time [phone]    PT Stop Time 1017    PT Time Calculation (min) 43 min    Activity Tolerance Patient tolerated treatment well    Behavior During Therapy [facility] for tasks assessed/performed             Past Medical History:  Diagnosis Date   Acid reflux    Anemia    Anxiety    C. 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[facility])    Past Surgical History:  Procedure Laterality Date   BREAST BIOPSY Left 04/25/2023   US  LT BREAST BX W LOC DEV 1ST LESION IMG BX SPEC US  GUIDE 04/25/2023 [facility] MAMMOGRAPHY   DILATATION & CURETTAGE/HYSTEROSCOPY WITH MYOSURE N/A 03/19/2022   Procedure: DILATATION & CURETTAGE/HYSTEROSCOPY WITH MYOSURE;  Surgeon: Cleatus Moccasin, MD;  Location: [facility]: [facility];  Service: Gynecology;  Laterality: N/A;   EYE SURGERY Bilateral    UPPER GASTROINTESTINAL ENDOSCOPY  2019   Patient Active Problem List   Diagnosis Date Noted   Fatigue 09/21/2024   Right upper quadrant abdominal pain 09/21/2024   Nausea 09/21/2024   Tachycardia 08/31/2024   C. 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   Migraine without aura and without status migrainosus, not intractable 12/15/2018   Anxiety 05/22/2018   Palpitations 05/18/2018   Globus sensation 01/15/2017    PCP: Daryl Setter, NP REFERRING PROVIDER: Skeet Juliene SAUNDERS, DO  REFERRING DIAG: R42 (ICD-10-CM) - Vertigo  THERAPY DIAG:  Dizziness and giddiness  Unsteadiness on feet  ONSET DATE: July 2025  Rationale for Evaluation and Treatment: Rehabilitation  SUBJECTIVE:   SUBJECTIVE STATEMENT: Pt reports she is on another medication for GI issues - says it helps with the nausea ; pt reports she is driving more now   Pt accompanied by: self  PERTINENT HISTORY: anemia, palpitations (negative cardiac workup), GERD, schizencephaly, tachycardia, h/o C. Diff colitis in August 2025  PAIN:  Are you having pain? Yes: NPRS scale: 5/10 Pain location: head - feels light pressure; abdominal pain - mild at this time Pain description: light pressure in head Aggravating factors: motion increases headache Relieving factors: medication, naps/lying down Pain on Lt side behind ear   PRECAUTIONS: None  RED FLAGS: None   WEIGHT BEARING RESTRICTIONS: No  FALLS: Has patient fallen in last 6 months? No  LIVING ENVIRONMENT: Lives with: lives with their spouse Lives in: House/apartment  PLOF: Independent - pt is currently on FMLA  PATIENT GOALS: best way to manage the dizziness - so I can start functioning again  OBJECTIVE:  Note: Objective measures were completed at Evaluation unless otherwise noted.  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 MRI scheduled on 10-24-24  COGNITION: Overall cognitive status: WFL's but  pt reports memory issues, loses train of thought easily - must write things down  Cervical ROM:  WNL's   GAIT: Gait pattern: WFL Distance walked: 8' Assistive device utilized: None Level of assistance: Complete Independence  VESTIBULAR ASSESSMENT:  GENERAL OBSERVATION: pt is a 33 yr old female with c/o vertigo/dizziness; pt reports initial episode of vertigo occurred in 2023; another episode occurred in March 2025 and most  recent in July 2025 but this episode was accompanied by 3 day onset of symptoms of diarrhea, dizziness and abdominal pain.     SYMPTOM BEHAVIOR:  Subjective history: most of time feels worse in the morning; wears contacts so is unable to see first thing in the morning  Non-Vestibular symptoms: neck pain, headaches, tinnitus, nausea/vomiting, and migraine symptoms  Type of dizziness: Spinning/Vertigo, Unsteady with head/body turns, Lightheadedness/Faint, and Funny feeling in the head  Frequency: daily - mostly  Duration: varies  Aggravating factors: Induced by motion: occur when walking, looking up at the ceiling, bending down to the ground, and quick head turns  Relieving factors: lying supine, medication, rest, slow movements, avoid busy/distracting environments, and new migraine medication just approved (Aimovig)  Progression of symptoms: worse  OCULOMOTOR EXAM:  Ocular Alignment: normal  Ocular ROM: No Limitations  Spontaneous Nystagmus: absent  Gaze-Induced Nystagmus: absent  Smooth Pursuits: intact  Saccades: intact  Convergence/Divergence: approx. 6    FRENZEL - FIXATION SUPRESSED:  Ocular Alignment: normal  Spontaneous Nystagmus: absent  Gaze-Induced Nystagmus: absent   Positional tests: Right Dix-Hallpike: no nystagmus Left Dix-Hallpike: no nystagmus Right Sidelying: no nystagmus Left Sidelying: no nystagmus c/o pressure with return to sitting   VESTIBULAR - OCULAR REFLEX:    Dynamic Visual Acuity: Static: line 10  Dynamic: line 7    POSITIONAL TESTING:  Right Dix-Hallpike: no nystagmus Left Dix-Hallpike: no nystagmus Right Sidelying: no nystagmus Left Sidelying: no nystagmus  MOTION SENSITIVITY:  Motion Sensitivity Quotient Intensity: 0 = none, 1 = Lightheaded, 2 = Mild, 3 = Moderate, 4 = Severe, 5 = Vomiting  Intensity  1. Sitting to supine   2. Supine to L side   3. Supine to R side   4. Supine to sitting   5. L Hallpike-Dix   6. Up from L    7. R  Hallpike-Dix   8. Up from R    9. Sitting, head tipped to L knee   10. Head up from L knee   11. Sitting, head tipped to R knee   12. Head up from R knee   13. Sitting head turns x5 1  14.Sitting head nods x5 2 - > symptoms  15. In stance, 180 turn to L    16. In stance, 180 turn to R      FUNCTIONAL GAIT: MCTSIB: Condition 1: Avg of 3 trials: 30 sec, Condition 2: Avg of 3 trials: 30 sec, Condition 3: Avg of 3 trials: 30 sec, Condition 4: Avg of 3 trials: 30 sec, and Total Score: 120/120  Pt reported dizziness upon completion of condition 4 but did not have LOB  TREATMENT DATE: 10-25-24  NeuroRe-ed: Pt performed x 1 viewing in standing - letter on picture in treatment room 3 for busy background - pt performed 30 secs each x 1 rep horizontal and vertical head turns Progressed to posture grid background for increased visual input - pt stood 5' away - performed horizontal and vertical head turns 30 secs 1 rep each direction; pt reported intermittent pain on Lt side of head during this exercise  TherAct: Pt performed smooth pursuits stepping in place on trampoline  Pt stood on trampoline - tossed red medicine ball - performed visual tracking of ball while stepping in place; progressed to turning 360 degrees 1 rep to Rt while tossing/catching ball; 1 rep turning to Lt side 360 degrees with tossing and catching ball - pt reported severe provocation of dizziness with this activity and requested to discontinue this activity  Pt performed tossing/catching red medium sized ball with letters  - amb. 30' x 2 reps tossing ball and reading a letter during amb.; amb. 30' making circles with ball CW, then CCW  Used patterned cloth on floor, progressed to mat - pt retrieved 4 cones from floor - turned and amb. On mat to place on other end of cloth; performed picking up 5# kettlebell -  diagonal lifts 3 reps X Performed picking up kettlebell, turned 360 degrees to Rt side 1 rep, then picked up and turned 360 degrees to Lt side 1 rep standing on patterned cloth on mat - moderate dizziness provoked with this activity  Pt performed sit to stand with pivot turn from mat - 1 rep to Lt side and 1 rep to Rt side  Pt reports she is in process of making diet modifications - no fried foods, low sodium, less eating out, etc.   HEP Progressions:   Added turning - step and pivot turn, progressing to 360 degree turn for habituation  to HEP  Progress letter exercise up to 60 secs in standing - both directions  Pillow exercises - standing only - EO and EC - feet together  ADD HEAD TURNS - EO AND EC - FEET APART - 5-10 REPS HEAD TURNS EACH DIRECTION  Access Code: [email or web address] URL: [email or web address] Date: 10/04/2024 Prepared by: Rock Kussmaul  Exercises - Standing Gaze Stabilization with Head Rotation  - 3-5 x daily - 7 x weekly - 1 sets - 1 reps - 30-60 hold - Standing Gaze Stabilization with Head Nod  - 3-5 x daily - 7 x weekly - 1 sets - 1 reps - 30-60 hold - Standing on foam pad - Eyes open and then with Eyes closed   - 1 x daily - 7 x weekly - 1 sets - 1 reps - 30 sec hold  PATIENT EDUCATION: Education details: progressed exercises - see above; gave article from [employer] on supermarket syndrome Person educated: Patient Education method: Explanation, Demonstration, and Handouts Education comprehension: verbalized understanding and returned demonstration  HOME EXERCISE PROGRAM:  Medbridge   GOALS: Goals reviewed with patient? Yes  SHORT TERM GOALS: same as LTG's as ELOS = 4 wks   LONG TERM GOALS: Target date: 11-05-24  Improve DVA to </= 2 line difference for improved gaze stabilization. Baseline: line 10/line 7 (3 line difference) Goal status: INITIAL  2.  Pt will perform vertical head turns (5 reps) in seated position with c/o dizziness </= 1/5  intensity. Baseline: 2/5 Goal status: INITIAL  3.  Pt will stand for 30 secs on condition 4 of mCTSIB with no c/o  dizziness to demo increased vestibular input in maintaining balance.  Baseline: increased symptoms provoked after standing for 30 secs with min. Postural sway Goal status: INITIAL  4.  Independent in HEP for vestibular exercises. Baseline:  Goal status: INITIAL  5.  Pt will subjectively report at least 25% improvement in dizziness.  Baseline:  Goal status: INITIAL  ASSESSMENT:  CLINICAL IMPRESSION: PT session focused on visual/vestibular exercises and balance exercises with increased vestibular input incorporated.  Pt reported severe dizziness with activity on trampoline - stepping in place with tossing/catching ball with turning 360 degrees 1 rep to Rt side and 1 rep to Lt side.  Pt reported moderate dizziness provoked with turning activity on patterned cloth on mat on floor.  Pt reported minimal dizziness provoked with amb. Tossing/catching ball reading letters.  Pt is progressing well towards LTG's.  Cont with POC.    OBJECTIVE IMPAIRMENTS: decreased balance, dizziness, and pain.   ACTIVITY LIMITATIONS: locomotion level  PARTICIPATION LIMITATIONS: interpersonal relationship, driving, and community activity  PERSONAL FACTORS: Behavior pattern, Past/current experiences, Time since onset of injury/illness/exacerbation, and 1-2 comorbidities: h/o migraines  & schizencephaly are also affecting patient's functional outcome.   REHAB POTENTIAL: Good  CLINICAL DECISION MAKING: Evolving/moderate complexity  EVALUATION COMPLEXITY: Moderate   PLAN:  PT FREQUENCY: 1x/week  PT DURATION: 4 weeks + eval  PLANNED INTERVENTIONS: 97110-Therapeutic exercises, 97530- Therapeutic activity, [id number]- Neuromuscular re-education, 97535- Self Care, and [id number]- Gait training  PLAN FOR NEXT SESSION:  check HEP progressions, check LTG's; MRI results? D/C next session?   R[place]anna [place], PT 10/25/2024, 6:49 PM

## 2024-10-25 NOTE — Telephone Encounter (Signed)
 Spoke with pt and she tells me that she is doing much better and has only had a small amount of BRB on the stool once since last episode.  She has no bleeding independent of stool. She has been advised to call our office if she does not continue to improve.

## 2024-10-25 NOTE — Telephone Encounter (Signed)
 Spoke with pt and she tells me that she is doing much better and has only had a small amount of BRB on the stool once since last episode.  She has no bleeding indeendent of stol

## 2024-10-26 NOTE — Telephone Encounter (Signed)
 The pt states that she has not seen any bleeding since Friday. She had a BM today that was somewhat painful with BRBPR on the stool and TP.  Nothing after the first wipe.  She does have hemorrhoids and states it felt like something scraped with BM  Had endo colon on 10/13/24.  Bleeding started about a week after but did resolve.

## 2024-10-26 NOTE — Telephone Encounter (Signed)
 PT is calling with a update. She stated that she went to bathroom today and seen way more blood that Friday. She stated that once she was done, she did not see any on the toilet paper but she is concerned. Please advise.

## 2024-10-27 ENCOUNTER — Ambulatory Visit: Admitting: Family

## 2024-10-27 VITALS — BP 120/80 | HR 97 | Temp 98.7°F | Resp 16 | Ht 63.0 in | Wt 122.6 lb

## 2024-10-27 DIAGNOSIS — K649 Unspecified hemorrhoids: Secondary | ICD-10-CM | POA: Diagnosis not present

## 2024-10-27 DIAGNOSIS — F429 Obsessive-compulsive disorder, unspecified: Secondary | ICD-10-CM | POA: Diagnosis not present

## 2024-10-27 DIAGNOSIS — F419 Anxiety disorder, unspecified: Secondary | ICD-10-CM

## 2024-10-27 DIAGNOSIS — F4322 Adjustment disorder with anxiety: Secondary | ICD-10-CM | POA: Diagnosis not present

## 2024-10-27 MED ORDER — HYDROCORTISONE ACETATE 25 MG RE SUPP: 25.0000 mg | Freq: Two times a day (BID) | RECTAL | 2 refills | Status: AC

## 2024-10-27 MED ORDER — SERTRALINE HCL 100 MG PO TABS: 100.0000 mg | ORAL_TABLET | Freq: Every day | ORAL | 1 refills | Status: DC

## 2024-10-27 NOTE — Progress Notes (Signed)
 Subjective:     Patient ID: Donna [name], female    DOB: [birth year], 33 y.o.   MRN: [id number]  Chief Complaint  Patient presents with   Anxiety    Here for follow up, doing a little better since increasing zoloft  to 75 mg a day.     HPI  Discussed the use of AI scribe software for clinical note transcription with the patient, who gave verbal consent to proceed.  History of Present Illness   Discussed the use of AI scribe software for clinical note transcription with the patient, who gave verbal consent to proceed.  History of Present Illness   Patient is a 33 yr old female who presents today for follow up.  She recently underwent EGD/Colo notable for gastritis and hemorrhoids.  She was started on mirtazapine for presumed IBS by GI.  H Pylori testing was negative.  Overall GI symptoms are improved and she has been moving her bowels more regularly, but has had some rectal bleeding/discomfort with BM's. Has left message with GI and is awaiting call back.  She is still not working, hoping to stay out of work until she gets her MRI results back from Neurology.   Anxiety is somewhat improved but still having some OCD symptoms. Insomnia is an ongoing issue and she plans to re-trial melatonin.      Health Maintenance Due  Topic Date Due   HIV Screening  Never done   DTaP/Tdap/Td (1 - Tdap) Never done   Hepatitis B Vaccines 19-59 Average Risk (1 of 3 - 19+ 3-dose series) Never done   HPV VACCINES (1 - Risk 3-dose SCDM series) Never done   COVID-19 Vaccine (4 - 2025-26 season) 08/30/2024    Past Medical History:  Diagnosis Date   Acid reflux    Anemia    Anxiety    C. 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[facility])     Past Surgical History:  Procedure Laterality Date   BREAST BIOPSY Left 04/25/2023   US  LT BREAST BX W LOC DEV 1ST LESION IMG BX SPEC US  GUIDE 04/25/2023 [facility] MAMMOGRAPHY   DILATATION & CURETTAGE/HYSTEROSCOPY WITH MYOSURE N/A  03/19/2022   Procedure: DILATATION & CURETTAGE/HYSTEROSCOPY WITH MYOSURE;  Surgeon: Cleatus Moccasin, MD;  Location: [facility];  Service: Gynecology;  Laterality: N/A;   EYE SURGERY Bilateral    UPPER GASTROINTESTINAL ENDOSCOPY  2019    Family History  Problem Relation Age of Onset   Ovarian cysts Mother    Anemia Mother    GER disease Father    Diabetes Brother        type 1   Stroke Maternal Grandmother    Colon cancer Maternal Grandmother    Prostate cancer Maternal Grandfather    Multiple sclerosis Paternal Grandmother    Cancer Paternal Grandfather    Angina Paternal Grandfather    Hypertension Neg Hx     Social History   Socioeconomic History   Marital status: Married    Spouse name: Not on file   Number of children: 0   Years of education: Not on file   Highest education level: Bachelor's degree (e.g., BA, AB, BS)  Occupational History    Comment: [employer]  Tobacco Use   Smoking status: Never   Smokeless tobacco: Never  Vaping Use   Vaping status: Never Used  Substance and Sexual Activity   Alcohol use: Yes    Comment: occasionally   Drug use: Never  Sexual activity: Yes    Partners: Male    Birth control/protection: Condom  Other Topics Concern   Not on file  Social History Narrative   Right handed   Social Drivers of Health   Financial Resource Strain: Low Risk  (08/31/2024)   Overall Financial Resource Strain (CARDIA)    Difficulty of Paying Living Expenses: Not hard at all  Food Insecurity: No Food Insecurity (08/31/2024)   Hunger Vital Sign    Worried About Running Out of Food in the Last Year: Never true    Ran Out of Food in the Last Year: Never true  Transportation Needs: No Transportation Needs (08/31/2024)   PRAPARE - Administrator, Civil Service (Medical): No    Lack of Transportation (Non-Medical): No  Physical Activity: Inactive (08/31/2024)   Exercise Vital Sign    Days of Exercise per Week: 1 day    Minutes of  Exercise per Session: 0 min  Stress: Stress Concern Present (08/31/2024)   [employer] of Occupational Health - Occupational Stress Questionnaire    Feeling of Stress: Very much  Social Connections: Moderately Isolated (08/31/2024)   Social Connection and Isolation Panel    Frequency of Communication with Friends and Family: More than three times a week    Frequency of Social Gatherings with Friends and Family: Never    Attends Religious Services: Never    Database Administrator or Organizations: No    Attends Engineer, Structural: Not on file    Marital Status: Married  Intimate Partner Violence: Unknown (04/05/2022)   Received from [facility]   HITS    Physically Hurt: Not on file    Insult or Talk Down To: Not on file    Threaten Physical Harm: Not on file    Scream or Curse: Not on file    Outpatient Medications Prior to Visit  Medication Sig Dispense Refill   dicyclomine  (BENTYL ) 20 MG tablet Take 1 tablet (20 mg total) by mouth 3 (three) times daily as needed for spasms. 20 tablet 0   Erenumab-aooe (AIMOVIG) 140 MG/ML SOAJ Inject 140 mg into the skin every 28 (twenty-eight) days. 1.12 mL 5   famotidine  (PEPCID ) 20 MG tablet Take 1 tablet (20 mg total) by mouth 2 (two) times daily. 60 tablet 2   ferrous sulfate 325 (65 FE) MG tablet Take 325 mg by mouth daily with breakfast.     ibuprofen  (ADVIL ) 800 MG tablet Take 1 tablet (800 mg total) by mouth 3 (three) times daily with meals as needed for headache, moderate pain or cramping. 60 tablet 1   loperamide  (IMODIUM ) 2 MG capsule Take 1 capsule (2 mg total) by mouth 4 (four) times daily as needed for diarrhea or loose stools. 12 capsule 0   meclizine  (ANTIVERT ) 12.5 MG tablet Take 1 tablet (12.5 mg total) by mouth every 8 (eight) hours as needed for dizziness or nausea. 30 tablet 0   mirtazapine (REMERON) 15 MG tablet Take 1 tablet (15 mg total) by mouth at bedtime. 30 tablet 1   ondansetron  (ZOFRAN ) 4 MG tablet Take 1  tablet (4 mg total) by mouth every 8 (eight) hours as needed for nausea or vomiting. 20 tablet 0   potassium chloride  SA (KLOR-CON  M) 20 MEQ tablet Take 1 tablet (20 mEq total) by mouth daily. 30 tablet 0   Prenatal Vit-Fe Fumarate-FA (MULTIVITAMIN-PRENATAL) 27-0.8 MG TABS tablet Take 1 tablet by mouth daily at 12 noon.     Vitamin  D, Ergocalciferol , (DRISDOL ) 1.25 MG (50000 UNIT) CAPS capsule Take 1 capsule (50,000 Units total) by mouth every 7 (seven) days. 12 capsule 0   sertraline  (ZOLOFT ) 50 MG tablet Take 1.5 tablets (75 mg total) by mouth daily. 45 tablet 0   No facility-administered medications prior to visit.    Allergies  Allergen Reactions   Penicillin G Rash   Sulfamethoxazole Rash    ROS    See HPI Objective:    Physical Exam Constitutional:      General: She is not in acute distress.    Appearance: Normal appearance. She is well-developed.  HENT:     Head: Normocephalic and atraumatic.     Right Ear: External ear normal.     Left Ear: External ear normal.  Eyes:     General: No scleral icterus. Neck:     Thyroid : No thyromegaly.  Cardiovascular:     Rate and Rhythm: Normal rate and regular rhythm.     Heart sounds: Normal heart sounds. No murmur heard. Pulmonary:     Effort: Pulmonary effort is normal. No respiratory distress.     Breath sounds: Normal breath sounds. No wheezing.  Musculoskeletal:     Cervical back: Neck supple.  Skin:    General: Skin is warm and dry.  Neurological:     Mental Status: She is alert and oriented to person, place, and time.  Psychiatric:        Mood and Affect: Mood is anxious.        Behavior: Behavior normal.        Thought Content: Thought content normal.        Judgment: Judgment normal.      BP 120/80 (BP Location: Right Arm, Patient Position: Sitting, Cuff Size: Small)   Pulse 97   Temp 98.7 F (37.1 C) (Tympanic)   Resp 16   Ht 5' 3 (1.6 m)   Wt 122 lb 9.6 oz (55.6 kg)   LMP 09/22/2024 (Exact Date)    SpO2 99%   BMI 21.72 kg/m  Wt Readings from Last 3 Encounters:  10/27/24 122 lb 9.6 oz (55.6 kg)  10/13/24 117 lb (53.1 kg)  10/05/24 117 lb (53.1 kg)       Assessment & Plan:   Problem List Items Addressed This Visit       Unprioritized   Obsessive-compulsive disorder   Improved but not optimized. Increase sertraline  from 75-100 mg.       Relevant Medications   sertraline  (ZOLOFT ) 100 MG tablet   Hemorrhoids - Primary   Rectal bleeding is new. Trial of hydrocortisone suppositories to see if this helps with her recurrent bleeding post BMs.       Relevant Medications   hydrocortisone (ANUSOL-HC) 25 MG suppository   Anxiety   Uncontrolled as well as some uncontrolled OCD symptoms. Will increase zoloft  from 75mg  to 100mg .       Relevant Medications   sertraline  (ZOLOFT ) 100 MG tablet    I have discontinued Devin Kobler's sertraline . I am also having her start on hydrocortisone and sertraline . Additionally, I am having her maintain her multivitamin-prenatal, ferrous sulfate, ibuprofen , loperamide , meclizine , potassium chloride  SA, dicyclomine , ondansetron , famotidine , Vitamin D  (Ergocalciferol ), Aimovig, and mirtazapine.  Meds ordered this encounter  Medications   hydrocortisone (ANUSOL-HC) 25 MG suppository    Sig: Place 1 suppository (25 mg total) rectally 2 (two) times daily.    Dispense:  12 suppository    Refill:  2    Supervising Provider:  BLYTH, STACEY A [[id number]]   sertraline  (ZOLOFT ) 100 MG tablet    Sig: Take 1 tablet (100 mg total) by mouth daily.    Dispense:  90 tablet    Refill:  1    Supervising Provider:   DOMENICA BLACKBIRD A [[id number]]

## 2024-10-27 NOTE — Assessment & Plan Note (Signed)
 Uncontrolled as well as some uncontrolled OCD symptoms. Will increase zoloft  from 75mg  to 100mg .

## 2024-10-27 NOTE — Assessment & Plan Note (Signed)
 Improved but not optimized. Increase sertraline  from 75-100 mg.

## 2024-10-27 NOTE — Patient Instructions (Signed)
 Increase sertraline  from 75mg  to 100mg .  Try hydrocortisone suppositories prn.

## 2024-10-27 NOTE — Assessment & Plan Note (Addendum)
 Rectal bleeding is new. Trial of hydrocortisone suppositories to see if this helps with her recurrent bleeding post BMs.

## 2024-10-28 ENCOUNTER — Encounter: Payer: Self-pay | Admitting: Family Medicine

## 2024-10-28 ENCOUNTER — Ambulatory Visit: Admitting: Family Medicine

## 2024-10-28 VITALS — BP 120/60 | HR 79 | Ht 63.0 in | Wt 122.6 lb

## 2024-10-28 DIAGNOSIS — M9901 Segmental and somatic dysfunction of cervical region: Secondary | ICD-10-CM

## 2024-10-28 DIAGNOSIS — M9904 Segmental and somatic dysfunction of sacral region: Secondary | ICD-10-CM | POA: Diagnosis not present

## 2024-10-28 DIAGNOSIS — M4135 Thoracogenic scoliosis, thoracolumbar region: Secondary | ICD-10-CM | POA: Diagnosis not present

## 2024-10-28 DIAGNOSIS — M9903 Segmental and somatic dysfunction of lumbar region: Secondary | ICD-10-CM

## 2024-10-28 DIAGNOSIS — E538 Deficiency of other specified B group vitamins: Secondary | ICD-10-CM | POA: Diagnosis not present

## 2024-10-28 DIAGNOSIS — M9908 Segmental and somatic dysfunction of rib cage: Secondary | ICD-10-CM

## 2024-10-28 DIAGNOSIS — M9902 Segmental and somatic dysfunction of thoracic region: Secondary | ICD-10-CM

## 2024-10-28 MED ORDER — CYANOCOBALAMIN 1000 MCG/ML IJ SOLN
1000.0000 ug | Freq: Once | INTRAMUSCULAR | Status: AC
  Administered 2024-10-28: 1000 ug via INTRAMUSCULAR

## 2024-10-28 NOTE — Patient Instructions (Addendum)
 Good to see you See me again in 6-8 weeks

## 2024-10-28 NOTE — Assessment & Plan Note (Signed)
 Patient has done relatively well overall.  We still now are having the further workup of patient's dizziness and lightheadedness.  Discussed with patient about icing regimen and home exercises, which activities to do and which ones to avoid.  Increase activity slowly.  Discussed icing regimen.  Follow-up again in 6 to 8 weeks.

## 2024-10-29 ENCOUNTER — Telehealth: Payer: Self-pay | Admitting: Family

## 2024-10-29 ENCOUNTER — Encounter: Payer: Self-pay | Admitting: Family Medicine

## 2024-10-29 ENCOUNTER — Encounter: Payer: Self-pay | Admitting: Family

## 2024-10-29 ENCOUNTER — Ambulatory Visit: Payer: Self-pay | Admitting: Neurology

## 2024-10-29 NOTE — Telephone Encounter (Signed)
 See mychart.

## 2024-11-01 ENCOUNTER — Ambulatory Visit: Payer: Self-pay | Attending: Neurology | Admitting: Physical Therapy

## 2024-11-01 ENCOUNTER — Telehealth: Payer: Self-pay | Admitting: Family

## 2024-11-01 DIAGNOSIS — R42 Dizziness and giddiness: Secondary | ICD-10-CM | POA: Diagnosis not present

## 2024-11-01 DIAGNOSIS — R2681 Unsteadiness on feet: Secondary | ICD-10-CM | POA: Insufficient documentation

## 2024-11-01 NOTE — Telephone Encounter (Signed)
 Pt dropped off forms to be completed by pcp. Placed in pcp's box in fo.

## 2024-11-01 NOTE — Therapy (Unsigned)
 OUTPATIENT PHYSICAL THERAPY VESTIBULAR TREATMENT NOTE/DISCHARGE SUMMARY     Patient Name: Donna [name] MRN: [id number] DOB:1991/06/09, 33 y.o., female Today's Date: 11/02/2024  END OF SESSION:  PT End of Session - 11/02/24 1935     Visit Number 5    Number of Visits 5   4 visits + eval   Date for Recertification  11/05/24    Authorization Type BCBS    PT Start Time 0845    PT Stop Time 0908    PT Time Calculation (min) 23 min    Activity Tolerance Patient tolerated treatment well    Behavior During Therapy [facility] for tasks assessed/performed              Past Medical History:  Diagnosis Date   Acid reflux    Anemia    Anxiety    C. 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[facility])    Past Surgical History:  Procedure Laterality Date   BREAST BIOPSY Left 04/25/2023   US  LT BREAST BX W LOC DEV 1ST LESION IMG BX SPEC US  GUIDE 04/25/2023 [facility]   DILATATION & CURETTAGE/HYSTEROSCOPY WITH MYOSURE N/A 03/19/2022   Procedure: DILATATION & CURETTAGE/HYSTEROSCOPY WITH MYOSURE;  Surgeon: Cleatus Moccasin, MD;  Location: [facility] [place];  Service: Gynecology;  Laterality: N/A;   EYE SURGERY Bilateral    UPPER GASTROINTESTINAL ENDOSCOPY  2019   Patient Active Problem List   Diagnosis Date Noted   Obsessive-compulsive disorder 10/27/2024   Hemorrhoids 10/27/2024   Nausea 09/21/2024   Tachycardia 08/31/2024   Right wrist pain 03/19/2024   Left ankle pain 01/02/2023   SI (sacroiliac) joint dysfunction 02/28/2022   Somatic dysfunction of spine, sacral 02/28/2022   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  Plans to return to work on [employer].  Dr. Claudene recommended blue lens glasses. Is going to be able to have breaks at work so that should help with looking at computer so much.  Pt had MRI on 10-24-24 - no intracranial abnormality noted - showed some Lt mastoid air cell effusion.    Pt accompanied by: self  PERTINENT HISTORY: anemia, palpitations (negative cardiac workup), GERD, schizencephaly, tachycardia, h/o C. Diff colitis in August 2025  PAIN:  Are you having pain? Yes: NPRS scale: 7/10 Pain location: head - feels light pressure; abdominal pain - mild at this time Pain description: light pressure in head Aggravating factors: motion increases headache Relieving factors: medication, naps/lying down Pain on Lt side behind ear   PRECAUTIONS: None  RED FLAGS: None   WEIGHT BEARING RESTRICTIONS: No  FALLS: Has patient fallen in last 6 months? No  LIVING ENVIRONMENT: Lives with: lives with their spouse Lives in: House/apartment  PLOF: Independent - pt is currently on FMLA  PATIENT GOALS: best way to manage the dizziness - so I can start functioning again  OBJECTIVE:  Note: Objective measures were completed at Evaluation  unless otherwise noted.  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 MRI scheduled on 10-24-24  COGNITION: Overall cognitive status: WFL's but pt reports memory issues, loses train of thought easily - must write things down  Cervical ROM:  WNL's   GAIT: Gait pattern: WFL Distance walked: 30' Assistive device utilized: None Level of assistance: Complete  Independence  VESTIBULAR ASSESSMENT:  GENERAL OBSERVATION: pt is a 33 yr old female with c/o vertigo/dizziness; pt reports initial episode of vertigo occurred in 2023; another episode occurred in March 2025 and most recent in July 2025 but this episode was accompanied by 3 day onset of symptoms of diarrhea, dizziness and abdominal pain.     SYMPTOM BEHAVIOR:  Subjective history: most of time feels worse in the morning; wears contacts so is unable to see first thing in the morning  Non-Vestibular symptoms: neck pain, headaches, tinnitus, nausea/vomiting, and migraine symptoms  Type of dizziness: Spinning/Vertigo, Unsteady with head/body turns, Lightheadedness/Faint, and Funny feeling in the head  Frequency: daily - mostly  Duration: varies  Aggravating factors: Induced by motion: occur when walking, looking up at the ceiling, bending down to the ground, and quick head turns  Relieving factors: lying supine, medication, rest, slow movements, avoid busy/distracting environments, and new migraine medication just approved (Aimovig)  Progression of symptoms: worse  OCULOMOTOR EXAM:  Ocular Alignment: normal  Ocular ROM: No Limitations  Spontaneous Nystagmus: absent  Gaze-Induced Nystagmus: absent  Smooth Pursuits: intact  Saccades: intact  Convergence/Divergence: approx. 6    FRENZEL - FIXATION SUPRESSED:  Ocular Alignment: normal  Spontaneous Nystagmus: absent  Gaze-Induced Nystagmus: absent   Positional tests: Right Dix-Hallpike: no nystagmus Left Dix-Hallpike: no nystagmus Right Sidelying: no nystagmus Left Sidelying: no nystagmus c/o pressure with return to sitting   VESTIBULAR - OCULAR REFLEX:    Dynamic Visual Acuity: Static: line 10  Dynamic: line 7    POSITIONAL TESTING:  Right Dix-Hallpike: no nystagmus Left Dix-Hallpike: no nystagmus Right Sidelying: no nystagmus Left Sidelying: no nystagmus  MOTION SENSITIVITY:  Motion Sensitivity Quotient Intensity: 0 = none, 1  = Lightheaded, 2 = Mild, 3 = Moderate, 4 = Severe, 5 = Vomiting  Intensity  1. Sitting to supine   2. Supine to L side   3. Supine to R side   4. Supine to sitting   5. L Hallpike-Dix   6. Up from L    7. R Hallpike-Dix   8. Up from R    9. Sitting, head tipped to L knee   10. Head up from L knee   11. Sitting, head tipped to R knee   12. Head up from R knee   13. Sitting head turns x5 1  14.Sitting head nods x5 2 - > symptoms  15. In stance, 180 turn to L    16. In stance, 180 turn to R      FUNCTIONAL GAIT: MCTSIB: Condition 1: Avg of 3 trials: 30 sec, Condition 2: Avg of 3 trials: 30 sec, Condition 3: Avg of 3 trials: 30 sec, Condition 4: Avg of 3 trials: 30 sec, and Total Score: 120/120  Pt reported dizziness upon completion of condition 4 but did not have LOB  TREATMENT DATE: 11-01-24  TherAct:  SVA line 10;  DVA line 9 (WNL's)  Pt stood on [employer] with feet together for 30 secs with min. Postural sway (condition 4 of mCTSIB) Pt performed vertical head turns - 5 reps in seated position - rated dizziness 3/5 intensity upon completion, however, stated dizziness quickly subsided   Self Care; Reviewed LTG's and progress Reviewed HEP and progressions     HEP Progressions:   Added turning - step and pivot turn, progressing to 360 degree turn for habituation  to HEP  Progress letter exercise up to 60 secs in standing - both directions  Pillow exercises - standing only - EO and EC - feet together  ADD HEAD TURNS - EO AND EC - FEET APART - 5-10 REPS HEAD TURNS EACH DIRECTION  Access Code: [email or web address] URL: [email or web address] Date: 10/04/2024 Prepared by: Rock Kussmaul  Exercises - Standing Gaze Stabilization with Head Rotation  - 3-5 x daily - 7 x weekly - 1 sets - 1 reps - 30-60 hold - Standing Gaze Stabilization with Head Nod  - 3-5 x  daily - 7 x weekly - 1 sets - 1 reps - 30-60 hold - Standing on foam pad - Eyes open and then with Eyes closed   - 1 x daily - 7 x weekly - 1 sets - 1 reps - 30 sec hold  PATIENT EDUCATION: Education details: progressed exercises - see above; gave article from [employer] on supermarket syndrome Person educated: Patient Education method: Explanation, Demonstration, and Handouts Education comprehension: verbalized understanding and returned demonstration  HOME EXERCISE PROGRAM:  Medbridge   GOALS: Goals reviewed with patient? Yes  SHORT TERM GOALS: same as LTG's as ELOS = 4 wks   LONG TERM GOALS: Target date: 11-05-24  Improve DVA to </= 2 line difference for improved gaze stabilization. Baseline: line 10/line 7 (3 line difference); 1 line difference  line 10/line 9 - 11-01-24 Goal status:  Goal met 11-01-24  2.  Pt will perform vertical head turns (5 reps) in seated position with c/o dizziness </= 1/5 intensity. Baseline: 2/5;  11-01-24:   3/5 - dizziness subsides very quickly  Goal status: Goal partially met 11-01-24  3.  Pt will stand for 30 secs on condition 4 of mCTSIB with no c/o dizziness to demo increased vestibular input in maintaining balance.  Baseline: increased symptoms provoked after standing for 30 secs with min. Postural sway Goal status: Goal met 11-01-24  4.  Independent in HEP for vestibular exercises. Baseline:  Goal status: Goal met 11-01-24  5.  Pt will subjectively report at least 25% improvement in dizziness.  Baseline:  Goal status: Goal met 11-01-24  ASSESSMENT:  CLINICAL IMPRESSION: Pt has met LTG's #1, 3-5;  LTG #2 partially met as pt reports dizziness 3/5 intensity after performing 5 reps of vertical head turns.  Pt does report dizziness very quickly subsides (within few seconds). Pt reports she is planning on returning to work on [employer]. This week.  Pt is discharged due to completion of program and goals met.  Pt agrees with D/C at this time.    OBJECTIVE  IMPAIRMENTS: decreased balance, dizziness, and pain.   ACTIVITY LIMITATIONS: locomotion level  PARTICIPATION LIMITATIONS: interpersonal relationship, driving, and community activity  PERSONAL FACTORS: Behavior pattern, Past/current experiences, Time since onset of injury/illness/exacerbation, and 1-2 comorbidities: h/o migraines  & schizencephaly are also affecting patient's functional outcome.   REHAB POTENTIAL: Good  CLINICAL DECISION MAKING: Evolving/moderate complexity  EVALUATION COMPLEXITY: Moderate  PLAN:  PT FREQUENCY: 1x/week  PT DURATION: 4 weeks + eval  PLANNED INTERVENTIONS: 97110-Therapeutic exercises, 97530- Therapeutic activity, 97112- Neuromuscular re-education, [phone]- Self Care, and [id number]- Gait training  PLAN FOR NEXT SESSION:   N/A - D/C on 11-01-24   PHYSICAL THERAPY DISCHARGE SUMMARY  Visits from Start of Care: 5  Current functional level related to goals / functional outcomes: See above for progress towards goals   Remaining deficits: Continued intermittent dizziness - pt reports short duration of dizziness; states she has had much improvement in the past week   Education / Equipment: Pt has been instructed in HEP for vestibular exercises; pt reports compliance with HEP.    Patient agrees to discharge. Patient goals were met. Patient is being discharged due to meeting the stated rehab goals.    Roxanna [place], PT 11/02/2024, 7:55 PM

## 2024-11-02 ENCOUNTER — Encounter: Payer: Self-pay | Admitting: Physical Therapy

## 2024-11-02 NOTE — Telephone Encounter (Signed)
Forms received and placed in providers folder to be completed

## 2024-11-09 DIAGNOSIS — Z0279 Encounter for issue of other medical certificate: Secondary | ICD-10-CM

## 2024-11-10 DIAGNOSIS — F4322 Adjustment disorder with anxiety: Secondary | ICD-10-CM | POA: Diagnosis not present

## 2024-11-23 DIAGNOSIS — F4322 Adjustment disorder with anxiety: Secondary | ICD-10-CM | POA: Diagnosis not present

## 2024-12-08 DIAGNOSIS — F4322 Adjustment disorder with anxiety: Secondary | ICD-10-CM | POA: Diagnosis not present

## 2024-12-13 NOTE — Progress Notes (Unsigned)
 [name] [address] [place] [address] Phone: [phone] Subjective:   Donna [name] am a scribe for Dr. Claudene.   I'm seeing this patient by the request  of:  Donna Setter, NP  CC: Back and neck pain follow-up  YEP:[name]  Donna [name] is a 33 y.o. female coming in with complaint of back and neck pain. OMT 10/28/2024. Patient states pretty much the same. She has pain in the left of her back. When she bends forward she feels like two stones rubbing together.  Medications patient has been prescribed: Vit D  Taking: yes          Reviewed prior external information including notes and imaging from previsou exam, outside providers and external EMR if available.   As well as notes that were available from care everywhere and other healthcare systems.  Past medical history, social, surgical and family history all reviewed in electronic medical record.  No pertanent information unless stated regarding to the chief complaint.   Past Medical History:  Diagnosis Date   Acid reflux    Anemia    Anxiety    C. 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(HCC)     Allergies[1]   Review of Systems:  No headache, visual changes, nausea, vomiting, diarrhea, constipation, dizziness, abdominal pain, skin rash, fevers, chills, night sweats, weight loss, swollen lymph nodes, body aches, joint swelling, chest pain, shortness of breath, mood changes. POSITIVE muscle aches  Objective  Blood pressure 110/60, pulse 83, height 5' 3 (1.6 m), weight 135 lb (61.2 kg), SpO2 98%.   General: No apparent distress alert and oriented x3 mood and affect normal, dressed appropriately.  HEENT: Pupils equal, extraocular movements intact  Respiratory: Patient's speak in full sentences and does not appear short of breath  Cardiovascular: No lower extremity edema, non tender, no erythema  Gait normal MSK:  Back scoliosis noted.   Some tightness noted in the right thoracolumbar juncture.  Good range of motion noted otherwise.  Mild positive [name] on the left side.  Osteopathic findings  C2 flexed rotated and side bent right C6 flexed rotated and side bent left T3 extended rotated and side bent right inhaled rib T11 extended rotated and side bent left L1 flexed rotated and side bent left Sacrum left on left       Assessment and Plan:  Thoracogenic scoliosis of thoracolumbar region Discussed with patient about icing regimen and home exercises.  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  Discussed with patient about continuing with the massage.  Patient is looking forward to a better year next year.  We discussed with patient to continue to do what is necessary.  Follow-up again in 6 to 8 weeks.    Nonallopathic problems  Decision today to treat with OMT was based on Physical Exam  After verbal consent patient was treated with HVLA, ME, FPR techniques in cervical, rib, thoracic, lumbar, and sacral  areas  Patient tolerated the procedure well with improvement in symptoms  Patient given exercises, stretches and lifestyle modifications  See medications in patient instructions if given  Patient will follow up in 4-8 weeks             Note: This dictation was prepared with Dragon dictation along with smaller phrase technology. Any transcriptional errors that result from this process are unintentional.            [  1]  Allergies Allergen Reactions   Penicillin G Rash   Sulfamethoxazole Rash

## 2024-12-14 ENCOUNTER — Encounter: Payer: Self-pay | Admitting: Family Medicine

## 2024-12-14 ENCOUNTER — Ambulatory Visit: Admitting: Family Medicine

## 2024-12-14 VITALS — BP 110/60 | HR 83 | Ht 63.0 in | Wt 135.0 lb

## 2024-12-14 DIAGNOSIS — M4135 Thoracogenic scoliosis, thoracolumbar region: Secondary | ICD-10-CM | POA: Diagnosis not present

## 2024-12-14 DIAGNOSIS — M9904 Segmental and somatic dysfunction of sacral region: Secondary | ICD-10-CM

## 2024-12-14 DIAGNOSIS — M9908 Segmental and somatic dysfunction of rib cage: Secondary | ICD-10-CM

## 2024-12-14 DIAGNOSIS — M9901 Segmental and somatic dysfunction of cervical region: Secondary | ICD-10-CM

## 2024-12-14 DIAGNOSIS — M9902 Segmental and somatic dysfunction of thoracic region: Secondary | ICD-10-CM

## 2024-12-14 DIAGNOSIS — M9903 Segmental and somatic dysfunction of lumbar region: Secondary | ICD-10-CM

## 2024-12-14 NOTE — Patient Instructions (Addendum)
Good to see you!  See me again in 8 weeks.  

## 2024-12-14 NOTE — Assessment & Plan Note (Signed)
 Discussed with patient about icing regimen and home exercises.  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  Discussed with patient about continuing with the massage.  Patient is looking forward to a better year next year.  We discussed with patient to continue to do what is necessary.  Follow-up again in 6 to 8 weeks.

## 2024-12-28 DIAGNOSIS — F4322 Adjustment disorder with anxiety: Secondary | ICD-10-CM | POA: Diagnosis not present

## 2025-01-04 ENCOUNTER — Ambulatory Visit: Admitting: Family

## 2025-01-04 VITALS — BP 111/64 | HR 84 | Temp 98.4°F | Resp 16 | Ht 63.0 in | Wt 135.4 lb

## 2025-01-04 DIAGNOSIS — F429 Obsessive-compulsive disorder, unspecified: Secondary | ICD-10-CM | POA: Diagnosis not present

## 2025-01-04 DIAGNOSIS — Z0000 Encounter for general adult medical examination without abnormal findings: Secondary | ICD-10-CM | POA: Diagnosis not present

## 2025-01-04 DIAGNOSIS — Z13220 Encounter for screening for lipoid disorders: Secondary | ICD-10-CM

## 2025-01-04 DIAGNOSIS — R1011 Right upper quadrant pain: Secondary | ICD-10-CM | POA: Diagnosis not present

## 2025-01-04 DIAGNOSIS — F419 Anxiety disorder, unspecified: Secondary | ICD-10-CM

## 2025-01-04 DIAGNOSIS — R110 Nausea: Secondary | ICD-10-CM | POA: Diagnosis not present

## 2025-01-04 DIAGNOSIS — E538 Deficiency of other specified B group vitamins: Secondary | ICD-10-CM

## 2025-01-04 DIAGNOSIS — E559 Vitamin D deficiency, unspecified: Secondary | ICD-10-CM | POA: Diagnosis not present

## 2025-01-04 DIAGNOSIS — D509 Iron deficiency anemia, unspecified: Secondary | ICD-10-CM | POA: Diagnosis not present

## 2025-01-04 DIAGNOSIS — Z114 Encounter for screening for human immunodeficiency virus [HIV]: Secondary | ICD-10-CM

## 2025-01-04 LAB — VITAMIN B12: Vitamin B-12: 787 pg/mL (ref 211–911)

## 2025-01-04 LAB — CBC WITH DIFFERENTIAL/PLATELET
Basophils Absolute: 0.1 K/uL (ref 0.0–0.1)
Basophils Relative: 1.2 % (ref 0.0–3.0)
Eosinophils Absolute: 0 K/uL (ref 0.0–0.7)
Eosinophils Relative: 0.3 % (ref 0.0–5.0)
HCT: 32.6 % — ABNORMAL LOW (ref 36.0–46.0)
Hemoglobin: 10.6 g/dL — ABNORMAL LOW (ref 12.0–15.0)
Lymphocytes Relative: 14.8 % (ref 12.0–46.0)
Lymphs Abs: 0.6 K/uL — ABNORMAL LOW (ref 0.7–4.0)
MCHC: 32.6 g/dL (ref 30.0–36.0)
MCV: 83.6 fl (ref 78.0–100.0)
Monocytes Absolute: 0.2 K/uL (ref 0.1–1.0)
Monocytes Relative: 5.5 % (ref 3.0–12.0)
Neutro Abs: 3.3 K/uL (ref 1.4–7.7)
Neutrophils Relative %: 78.2 % — ABNORMAL HIGH (ref 43.0–77.0)
Platelets: 309 K/uL (ref 150.0–400.0)
RBC: 3.9 Mil/uL (ref 3.87–5.11)
RDW: 15.8 % — ABNORMAL HIGH (ref 11.5–15.5)
WBC: 4.3 K/uL (ref 4.0–10.5)

## 2025-01-04 LAB — IBC + FERRITIN
Ferritin: 2.9 ng/mL — ABNORMAL LOW (ref 10.0–291.0)
Iron: 15 ug/dL — ABNORMAL LOW (ref 42–145)
Saturation Ratios: 3.6 % — ABNORMAL LOW (ref 20.0–50.0)
TIBC: 415.8 ug/dL (ref 250.0–450.0)
Transferrin: 297 mg/dL (ref 212.0–360.0)

## 2025-01-04 LAB — LIPID PANEL
Cholesterol: 163 mg/dL (ref 28–200)
HDL: 71.1 mg/dL
LDL Cholesterol: 76 mg/dL (ref 10–99)
NonHDL: 92.02
Total CHOL/HDL Ratio: 2
Triglycerides: 81 mg/dL (ref 10.0–149.0)
VLDL: 16.2 mg/dL (ref 0.0–40.0)

## 2025-01-04 LAB — VITAMIN D 25 HYDROXY (VIT D DEFICIENCY, FRACTURES): VITD: 29.23 ng/mL — ABNORMAL LOW (ref 30.00–100.00)

## 2025-01-04 LAB — HIV ANTIBODY (ROUTINE TESTING W REFLEX)
HIV 1&2 Ab, 4th Generation: NONREACTIVE
HIV FINAL INTERPRETATION: NEGATIVE

## 2025-01-04 MED ORDER — DICYCLOMINE HCL 20 MG PO TABS: 20.0000 mg | ORAL_TABLET | Freq: Three times a day (TID) | ORAL | 0 refills | Status: AC | PRN

## 2025-01-04 MED ORDER — HYDROXYZINE PAMOATE 25 MG PO CAPS: 25.0000 mg | ORAL_CAPSULE | Freq: Three times a day (TID) | ORAL | 5 refills | Status: AC | PRN

## 2025-01-04 MED ORDER — SERTRALINE HCL 100 MG PO TABS: 150.0000 mg | ORAL_TABLET | Freq: Every day | ORAL | 0 refills | Status: AC

## 2025-01-04 MED ORDER — ONDANSETRON HCL 4 MG PO TABS: 4.0000 mg | ORAL_TABLET | Freq: Three times a day (TID) | ORAL | 0 refills | Status: AC | PRN

## 2025-01-04 NOTE — Assessment & Plan Note (Signed)
" °  OCD symptoms persist with frequent checking behaviors. Current sertraline  100 mg daily is insufficient. Hydroxyzine  provides significant relief for acute anxiety episodes without habit formation. - Increased sertraline  to 150 mg daily. - Prescribed hydroxyzine  as needed for acute anxiety episodes. - Will follow up in 3 months to assess response to medication adjustments.  "

## 2025-01-04 NOTE — Assessment & Plan Note (Signed)
 Uncontrolled. Increase sertraline  from 100mg  to 150mg , add hydroxyzine  prn.

## 2025-01-04 NOTE — Progress Notes (Signed)
 "  Subjective:     Patient ID: Donna [name], female    DOB: 02-26-1991, 34 y.o.   MRN: [id number]  Chief Complaint  Patient presents with   Annual Exam    HPI  Discussed the use of AI scribe software for clinical note transcription with the patient, who gave verbal consent to proceed.  History of Present Illness Donna [name] is a 34 year old female who presents for an annual physical exam with concerns about anxiety and medication management.  She experiences ongoing anxiety symptoms, including checking behaviors, despite current treatment with sertraline  100 mg daily. Anxiety is particularly pronounced during her menstrual cycle, exacerbating her nausea. She has been using hydroxyzine , borrowed from her husband, on an as-needed basis, approximately 10-11 times over the past 60 days, with significant relief of anxiety symptoms. Hydroxyzine  has helped her manage anxiety without affecting her work schedule.  She experiences nausea, particularly during her menstrual cycle, and uses Zofran  and dicyclomine  as needed for relief. Her periods are regular but heavy, lasting about five days.  She reports muscle pain in the back of her legs and arms, particularly at night, persisting for a few nights. She has been taking Tylenol  and melatonin to aid sleep. This pain has occurred before, typically resolving after a day or two, but has persisted longer this time.  She has a history of low vitamin D  and B12 levels, for which she is taking supplements. She also has a history of low iron levels and is trying to increase dietary iron intake, though she is not consistently taking iron tablets.  She is focusing on a plant-based diet and increasing physical activity through activities like painting and organizing at home. No current cough, cold symptoms, swelling in legs, hearing or vision issues, skin concerns, or frequent headaches. Reports some nausea but no vomiting, normal bowel movements, no urinary  concerns, and no unusual muscle or joint pain except for recent muscle pain in legs and arms. No concerns about depression.    Health Maintenance Due  Topic Date Due   HIV Screening  Never done   COVID-19 Vaccine (4 - 2025-26 season) 08/30/2024    Past Medical History:  Diagnosis Date   Acid reflux    Anemia    Anxiety    C. 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[facility])     Past Surgical History:  Procedure Laterality Date   BREAST BIOPSY Left 04/25/2023   US  LT BREAST BX W LOC DEV 1ST LESION IMG BX SPEC US  GUIDE 04/25/2023 [facility]   DILATATION & CURETTAGE/HYSTEROSCOPY WITH MYOSURE N/A 03/19/2022   Procedure: DILATATION & CURETTAGE/HYSTEROSCOPY WITH MYOSURE;  Surgeon: Cleatus Moccasin, MD;  Location: [facility] [place];  Service: Gynecology;  Laterality: N/A;   EYE SURGERY Bilateral    UPPER GASTROINTESTINAL ENDOSCOPY  2019    Family History  Problem Relation Age of Onset   Ovarian cysts Mother    Anemia Mother    GER disease Father    Diabetes Brother        type 1   Stroke Maternal Grandmother    Colon cancer Maternal Grandmother    Prostate cancer Maternal Grandfather    Multiple sclerosis Paternal Grandmother    Cancer Paternal Grandfather    Angina Paternal Grandfather    Hypertension Neg Hx     Social History   Socioeconomic History   Marital status: Married    Spouse name: Not on file   Number  of children: 0   Years of education: Not on file   Highest education level: Bachelor's degree (e.g., BA, AB, BS)  Occupational History    Comment: [employer]  Tobacco Use   Smoking status: Never   Smokeless tobacco: Never  Vaping Use   Vaping status: Never Used  Substance and Sexual Activity   Alcohol use: Yes    Comment: occasionally   Drug use: Never   Sexual activity: Yes    Partners: Male    Birth control/protection: Condom  Other Topics Concern   Not on file  Social History Narrative   Right handed   Social  Drivers of Health   Tobacco Use: Low Risk (12/14/2024)   Patient History    Smoking Tobacco Use: Never    Smokeless Tobacco Use: Never    Passive Exposure: Not on file  Financial Resource Strain: Low Risk (01/04/2025)   Overall Financial Resource Strain (CARDIA)    Difficulty of Paying Living Expenses: Not hard at all  Food Insecurity: No Food Insecurity (01/04/2025)   Epic    Worried About Radiation Protection Practitioner of Food in the Last Year: Never true    Ran Out of Food in the Last Year: Never true  Transportation Needs: No Transportation Needs (01/04/2025)   Epic    Lack of Transportation (Medical): No    Lack of Transportation (Non-Medical): No  Physical Activity: Insufficiently Active (01/04/2025)   Exercise Vital Sign    Days of Exercise per Week: 2 days    Minutes of Exercise per Session: 10 min  Stress: Stress Concern Present (01/04/2025)   [employer] of Occupational Health - Occupational Stress Questionnaire    Feeling of Stress: To some extent  Social Connections: Moderately Isolated (01/04/2025)   Social Connection and Isolation Panel    Frequency of Communication with Friends and Family: More than three times a week    Frequency of Social Gatherings with Friends and Family: Patient declined    Attends Religious Services: Never    Database Administrator or Organizations: No    Attends Engineer, Structural: Not on file    Marital Status: Married  Intimate Partner Violence: Unknown (04/05/2022)   Received from [facility]   HITS    Physically Hurt: Not on file    Insult or Talk Down To: Not on file    Threaten Physical Harm: Not on file    Scream or Curse: Not on file  Depression (PHQ2-9): Medium Risk (01/04/2025)   Depression (PHQ2-9)    PHQ-2 Score: 5  Alcohol Screen: Low Risk (01/04/2025)   Alcohol Screen    Last Alcohol Screening Score (AUDIT): 1  Housing: Unknown (01/04/2025)   Epic    Unable to Pay for Housing in the Last Year: No    Number of Times Moved in the Last  Year: Not on file    Homeless in the Last Year: No  Utilities: Low Risk (04/09/2024)   Received from [facility]   Utilities    In the past 12 months has the electric, gas, oil, or water company threatened to shut off services in your home? : No  Health Literacy: Not on file    Outpatient Medications Prior to Visit  Medication Sig Dispense Refill   Erenumab -aooe (AIMOVIG ) 140 MG/ML SOAJ Inject 140 mg into the skin every 28 (twenty-eight) days. 1.12 mL 5   famotidine  (PEPCID ) 20 MG tablet Take 1 tablet (20 mg total) by mouth 2 (two) times daily. 60 tablet 2  ferrous sulfate  325 (65 FE) MG tablet Take 325 mg by mouth daily with breakfast.     hydrocortisone  (ANUSOL -HC) 25 MG suppository Place 1 suppository (25 mg total) rectally 2 (two) times daily. 12 suppository 2   loperamide  (IMODIUM ) 2 MG capsule Take 1 capsule (2 mg total) by mouth 4 (four) times daily as needed for diarrhea or loose stools. 12 capsule 0   meclizine  (ANTIVERT ) 12.5 MG tablet Take 1 tablet (12.5 mg total) by mouth every 8 (eight) hours as needed for dizziness or nausea. 30 tablet 0   mirtazapine  (REMERON ) 15 MG tablet Take 1 tablet (15 mg total) by mouth at bedtime. 30 tablet 1   potassium chloride  SA (KLOR-CON  M) 20 MEQ tablet Take 1 tablet (20 mEq total) by mouth daily. 30 tablet 0   Prenatal Vit-Fe Fumarate-FA (MULTIVITAMIN-PRENATAL) 27-0.8 MG TABS tablet Take 1 tablet by mouth daily at 12 noon.     Vitamin D , Ergocalciferol , (DRISDOL ) 1.25 MG (50000 UNIT) CAPS capsule Take 1 capsule (50,000 Units total) by mouth every 7 (seven) days. 12 capsule 0   dicyclomine  (BENTYL ) 20 MG tablet Take 1 tablet (20 mg total) by mouth 3 (three) times daily as needed for spasms. 20 tablet 0   ondansetron  (ZOFRAN ) 4 MG tablet Take 1 tablet (4 mg total) by mouth every 8 (eight) hours as needed for nausea or vomiting. 20 tablet 0   sertraline  (ZOLOFT ) 100 MG tablet Take 1 tablet (100 mg total) by mouth daily. 90 tablet 1   ibuprofen   (ADVIL ) 800 MG tablet Take 1 tablet (800 mg total) by mouth 3 (three) times daily with meals as needed for headache, moderate pain or cramping. 60 tablet 1   No facility-administered medications prior to visit.    Allergies[1]  Review of Systems  Constitutional:  Negative for weight loss.  HENT:  Negative for congestion and hearing loss.   Eyes:  Negative for blurred vision.  Respiratory:  Negative for cough.   Cardiovascular:  Negative for leg swelling.  Gastrointestinal:  Positive for nausea. Negative for constipation, diarrhea and vomiting.  Genitourinary:  Negative for dysuria and frequency.  Musculoskeletal:  Negative for joint pain and myalgias.  Skin:  Negative for rash.  Neurological:  Negative for headaches.  Psychiatric/Behavioral:  Negative for depression. The patient is nervous/anxious.        Objective:    Physical Exam   BP 111/64 (BP Location: Right Arm, Patient Position: Sitting, Cuff Size: Normal)   Pulse 84   Temp 98.4 F (36.9 C) (Oral)   Resp 16   Ht 5' 3 (1.6 m)   Wt 135 lb 6.4 oz (61.4 kg)   SpO2 100%   BMI 23.99 kg/m  Wt Readings from Last 3 Encounters:  01/04/25 135 lb 6.4 oz (61.4 kg)  12/14/24 135 lb (61.2 kg)  10/28/24 122 lb 9.6 oz (55.6 kg)  Physical Exam  Constitutional: She is oriented to person, place, and time. She appears well-developed and well-nourished. No distress.  HENT:  Head: Normocephalic and atraumatic.  Right Ear: Tympanic membrane and ear canal normal.  Left Ear: Tympanic membrane and ear canal normal.  Mouth/Throat: Oropharynx is clear and moist.  Eyes: Pupils are equal, round, and reactive to light. No scleral icterus.  Neck: Normal range of motion. No thyromegaly present.  Cardiovascular: Normal rate and regular rhythm.   No murmur heard. Pulmonary/Chest: Effort normal and breath sounds normal. No respiratory distress. He has no wheezes. She has no rales. She exhibits no  tenderness.  Abdominal: Soft. Bowel sounds  are normal. She exhibits no distension and no mass. There is no tenderness. There is no rebound and no guarding.  Musculoskeletal: She exhibits no edema.  Lymphadenopathy:    She has no cervical adenopathy.  Neurological: She is alert and oriented to person, place, and time. She has normal patellar reflexes. She exhibits normal muscle tone. Coordination normal.  Skin: Skin is warm and dry.  Psychiatric: She has a normal mood and affect. Her behavior is normal. Judgment and thought content normal.  Breast/Pelvic: deferred          Assessment & Plan:        Assessment & Plan:   Problem List Items Addressed This Visit       Unprioritized   Vitamin D  deficiency    Previously identified vitamin D  deficiency, currently on supplementation. - Checked vitamin D  levels to assess current status.       Relevant Orders   Vitamin D  (25 hydroxy)   Preventative health care - Primary   Continue healthy diet and regular exercise. She believes that she completed HPV and Hep B series and reports that she had a tetanus shot around 2020.  Pap up to date. Up to date on dental/vision exams.       Obsessive-compulsive disorder    OCD symptoms persist with frequent checking behaviors. Current sertraline  100 mg daily is insufficient. Hydroxyzine  provides significant relief for acute anxiety episodes without habit formation. - Increased sertraline  to 150 mg daily. - Prescribed hydroxyzine  as needed for acute anxiety episodes. - Will follow up in 3 months to assess response to medication adjustments.       Relevant Medications   sertraline  (ZOLOFT ) 100 MG tablet   Nausea    Intermittent nausea, particularly during menstrual cycle, managed with ondansetron  as needed. - Continue ondansetron  as needed for nausea.       Relevant Medications   dicyclomine  (BENTYL ) 20 MG tablet   Iron deficiency anemia    Iron deficiency anemia Low iron levels with heavy menstrual periods contributing to  iron loss. Inconsistent use of iron supplements. - Checked iron studies and complete blood count to assess current iron status. - Encouraged consistent use of iron supplements.       Relevant Orders   IBC + Ferritin   CBC w/Diff   Anxiety   Uncontrolled. Increase sertraline  from 100mg  to 150mg , add hydroxyzine  prn.       Relevant Medications   sertraline  (ZOLOFT ) 100 MG tablet   hydrOXYzine  (VISTARIL ) 25 MG capsule   Other Visit Diagnoses       Right upper quadrant abdominal pain       Relevant Medications   ondansetron  (ZOFRAN ) 4 MG tablet     Encounter for screening for HIV       Relevant Orders   HIV antibody (with reflex)     Screening for lipoid disorders       Relevant Orders   Lipid panel     B12 deficiency       Relevant Orders   B12      Assessment & Plan    I have discontinued Audra Macaluso's ibuprofen . I have also changed her sertraline . Additionally, I am having her start on hydrOXYzine . Lastly, I am having her maintain her multivitamin-prenatal, ferrous sulfate , loperamide , meclizine , potassium chloride  SA, famotidine , Vitamin D  (Ergocalciferol ), Aimovig , mirtazapine , hydrocortisone , dicyclomine , and ondansetron .  Meds ordered this encounter  Medications   dicyclomine  (BENTYL ) 20 MG tablet  Sig: Take 1 tablet (20 mg total) by mouth 3 (three) times daily as needed for spasms.    Dispense:  20 tablet    Refill:  0   ondansetron  (ZOFRAN ) 4 MG tablet    Sig: Take 1 tablet (4 mg total) by mouth every 8 (eight) hours as needed for nausea or vomiting.    Dispense:  20 tablet    Refill:  0   sertraline  (ZOLOFT ) 100 MG tablet    Sig: Take 1.5 tablets (150 mg total) by mouth at bedtime.    Dispense:  135 tablet    Refill:  0    Supervising Provider:   DOMENICA BLACKBIRD A [[id number]]   hydrOXYzine  (VISTARIL ) 25 MG capsule    Sig: Take 1 capsule (25 mg total) by mouth every 8 (eight) hours as needed.    Dispense:  30 capsule    Refill:  5    Supervising Provider:    DOMENICA BLACKBIRD A [[id number]]      [1]  Allergies Allergen Reactions   Penicillin G Rash   Sulfamethoxazole Rash   "

## 2025-01-04 NOTE — Assessment & Plan Note (Addendum)
 Continue healthy diet and regular exercise. She believes that she completed HPV and Hep B series and reports that she had a tetanus shot around 2020.  Pap up to date. Up to date on dental/vision exams.

## 2025-01-04 NOTE — Assessment & Plan Note (Signed)
" °  Previously identified vitamin D  deficiency, currently on supplementation. - Checked vitamin D  levels to assess current status.  "

## 2025-01-04 NOTE — Assessment & Plan Note (Signed)
" °  Intermittent nausea, particularly during menstrual cycle, managed with ondansetron  as needed. - Continue ondansetron  as needed for nausea.  "

## 2025-01-04 NOTE — Assessment & Plan Note (Signed)
" °  Iron deficiency anemia Low iron levels with heavy menstrual periods contributing to iron loss. Inconsistent use of iron supplements. - Checked iron studies and complete blood count to assess current iron status. - Encouraged consistent use of iron supplements.  "

## 2025-01-04 NOTE — Patient Instructions (Signed)
" °  VISIT SUMMARY: Today, you had your annual physical exam where we discussed your ongoing anxiety, nausea, muscle pain, and vitamin deficiencies. We also reviewed your general health maintenance and vaccination status.  YOUR PLAN: -OBSESSIVE-COMPULSIVE DISORDER WITH COMORBID ANXIETY: You have ongoing anxiety and obsessive-compulsive symptoms that are not fully managed with your current medication. We have increased your sertraline  dose to 150 mg daily and prescribed hydroxyzine  to use as needed for acute anxiety episodes. We will follow up in 3 months to see how you are responding to these changes.  -NAUSEA: You experience nausea, especially during your menstrual cycle. Continue using ondansetron  as needed to manage this symptom.  -IRON DEFICIENCY ANEMIA: You have low iron levels, which are likely worsened by your heavy menstrual periods. We have checked your iron levels and complete blood count. Please take your iron supplements consistently to improve your iron status.  -VITAMIN D  DEFICIENCY: You have a history of low vitamin D  levels and are currently taking supplements. We have checked your vitamin D  levels to see if your current supplementation is effective.  -VITAMIN B12 DEFICIENCY: You have a history of low vitamin B12 levels and are currently taking supplements. We have checked your vitamin B12 levels to ensure your supplementation is working.  -GENERAL HEALTH MAINTENANCE: You are up to date on most of your vaccinations and screenings. We have requested an HIV screening and encouraged you to submit your vaccine records via MyChart. Please schedule a vision exam and continue with your healthy diet and exercise routine.  INSTRUCTIONS: Please follow up in 3 months to assess your response to the increased sertraline  dose and the use of hydroxyzine . Continue taking your iron, vitamin D , and vitamin B12 supplements as directed. Schedule an HIV screening and a vision exam. Submit your vaccine  records via MyChart.                                              "

## 2025-01-05 ENCOUNTER — Telehealth: Payer: Self-pay | Admitting: Family

## 2025-01-05 DIAGNOSIS — E559 Vitamin D deficiency, unspecified: Secondary | ICD-10-CM

## 2025-01-05 DIAGNOSIS — D509 Iron deficiency anemia, unspecified: Secondary | ICD-10-CM

## 2025-01-05 MED ORDER — FERROUS SULFATE 325 (65 FE) MG PO TABS: 325.0000 mg | ORAL_TABLET | ORAL | Status: AC

## 2025-01-05 MED ORDER — VITAMIN D3 75 MCG (3000 UT) PO TABS: 1.0000 | ORAL_TABLET | Freq: Every day | ORAL | Status: AC

## 2025-01-05 NOTE — Telephone Encounter (Signed)
"  See mychart  "

## 2025-02-08 ENCOUNTER — Ambulatory Visit: Admitting: Family Medicine

## 2025-04-05 ENCOUNTER — Ambulatory Visit: Admitting: Family

## 2025-04-20 ENCOUNTER — Ambulatory Visit: Admitting: Neurology
